# Patient Record
Sex: Female | Born: 1980
Health system: Southern US, Community
[De-identification: ages and names within clinical notes are randomized; demographics above are authoritative.]

## PROBLEM LIST (undated history)

## (undated) DIAGNOSIS — G4710 Hypersomnia, unspecified: Secondary | ICD-10-CM

## (undated) DIAGNOSIS — F329 Major depressive disorder, single episode, unspecified: Secondary | ICD-10-CM

## (undated) DIAGNOSIS — R519 Headache, unspecified: Secondary | ICD-10-CM

## (undated) DIAGNOSIS — G894 Chronic pain syndrome: Secondary | ICD-10-CM

## (undated) DIAGNOSIS — F411 Generalized anxiety disorder: Secondary | ICD-10-CM

## (undated) DIAGNOSIS — R51 Headache: Secondary | ICD-10-CM

## (undated) DIAGNOSIS — G35 Multiple sclerosis: Principal | ICD-10-CM

## (undated) DIAGNOSIS — H539 Unspecified visual disturbance: Secondary | ICD-10-CM

## (undated) DIAGNOSIS — F429 Obsessive-compulsive disorder, unspecified: Secondary | ICD-10-CM

## (undated) DIAGNOSIS — F32A Depression, unspecified: Secondary | ICD-10-CM

## (undated) DIAGNOSIS — M797 Fibromyalgia: Secondary | ICD-10-CM

## (undated) HISTORY — PX: NO PAST SURGERIES: SHX2092

## (undated) HISTORY — DX: Unspecified visual disturbance: H53.9

## (undated) HISTORY — DX: Headache, unspecified: R51.9

## (undated) HISTORY — DX: Hypersomnia, unspecified: G47.10

## (undated) HISTORY — DX: Headache: R51

## (undated) HISTORY — DX: Multiple sclerosis: G35

---

## 1999-02-21 ENCOUNTER — Inpatient Hospital Stay (HOSPITAL_COMMUNITY): Admission: AD | Admit: 1999-02-21 | Discharge: 1999-02-21 | Payer: Self-pay | Admitting: Obstetrics & Gynecology

## 2011-04-21 DIAGNOSIS — Z833 Family history of diabetes mellitus: Secondary | ICD-10-CM | POA: Insufficient documentation

## 2011-04-21 DIAGNOSIS — R634 Abnormal weight loss: Secondary | ICD-10-CM | POA: Insufficient documentation

## 2011-09-20 DIAGNOSIS — G35 Multiple sclerosis: Secondary | ICD-10-CM | POA: Insufficient documentation

## 2011-10-10 DIAGNOSIS — R208 Other disturbances of skin sensation: Secondary | ICD-10-CM | POA: Insufficient documentation

## 2011-12-28 DIAGNOSIS — R202 Paresthesia of skin: Secondary | ICD-10-CM | POA: Insufficient documentation

## 2011-12-28 DIAGNOSIS — R29898 Other symptoms and signs involving the musculoskeletal system: Secondary | ICD-10-CM | POA: Insufficient documentation

## 2011-12-28 DIAGNOSIS — H538 Other visual disturbances: Secondary | ICD-10-CM | POA: Insufficient documentation

## 2011-12-28 DIAGNOSIS — Z720 Tobacco use: Secondary | ICD-10-CM | POA: Insufficient documentation

## 2011-12-28 DIAGNOSIS — F17200 Nicotine dependence, unspecified, uncomplicated: Secondary | ICD-10-CM | POA: Insufficient documentation

## 2011-12-28 DIAGNOSIS — G35 Multiple sclerosis: Secondary | ICD-10-CM | POA: Insufficient documentation

## 2014-10-28 ENCOUNTER — Ambulatory Visit (INDEPENDENT_AMBULATORY_CARE_PROVIDER_SITE_OTHER): Payer: Medicare Other | Admitting: Neurology

## 2014-10-28 ENCOUNTER — Encounter: Payer: Self-pay | Admitting: Neurology

## 2014-10-28 VITALS — BP 110/76 | HR 84 | Resp 12 | Ht 64.0 in | Wt 109.4 lb

## 2014-10-28 DIAGNOSIS — R269 Unspecified abnormalities of gait and mobility: Secondary | ICD-10-CM

## 2014-10-28 DIAGNOSIS — R350 Frequency of micturition: Secondary | ICD-10-CM | POA: Insufficient documentation

## 2014-10-28 DIAGNOSIS — R5383 Other fatigue: Secondary | ICD-10-CM | POA: Diagnosis not present

## 2014-10-28 DIAGNOSIS — M5417 Radiculopathy, lumbosacral region: Secondary | ICD-10-CM | POA: Insufficient documentation

## 2014-10-28 DIAGNOSIS — R208 Other disturbances of skin sensation: Secondary | ICD-10-CM

## 2014-10-28 DIAGNOSIS — F09 Unspecified mental disorder due to known physiological condition: Secondary | ICD-10-CM

## 2014-10-28 DIAGNOSIS — G35 Multiple sclerosis: Secondary | ICD-10-CM

## 2014-10-28 HISTORY — DX: Multiple sclerosis: G35

## 2014-10-28 MED ORDER — LAMOTRIGINE 25 MG PO TABS: ORAL_TABLET | ORAL | Status: DC

## 2014-10-28 MED ORDER — METHOCARBAMOL 500 MG PO TABS: 500.0000 mg | ORAL_TABLET | Freq: Three times a day (TID) | ORAL | Status: DC

## 2014-10-28 MED ORDER — LAMOTRIGINE 100 MG PO TABS: 100.0000 mg | ORAL_TABLET | Freq: Every day | ORAL | Status: DC

## 2014-10-28 NOTE — Progress Notes (Signed)
[facility]  PATIENT: Sabrina [name] DOB: May 22, 1981  REFERRING DOCTOR OR PCP:  Lorelei Pont SOURCE: Patient and records form PCP  _________________________________   HISTORICAL  CHIEF COMPLAINT:  Chief Complaint  Patient presents with  . Multiple Sclerosis    Sts. she was dx. in December 2012.  Presenting sx. was optic neuritis right eye.  Sts. she initially saw neuro in [place], [place].  Sts. she stopped in 2015 due to inj. site fatigue.  She then tried Gilenya but couldn't tolerate side effects--gi mainly--and felt depression worsened. She restarted Copaxone one month ago. She is afraid to try another oral therapy due to potential side effects./fim   . Back Pain    Sts. has had lbp, left hip pain radiating down the back and front of left leg to foot., onset 2014 with no known injury.  She is currently taking Hydrocodone  bid prn.  Sts.  has not had any imaging studies.  Sts. has never had any procedure (esi, tpi, hip inj) for pain.  Sts. oral and iv steroids help pain./fim    HISTORY OF PRESENT ILLNESS:  I had the pleasure of seeing your patient, Sabrina [name], at for a neurologic consultation regarding her MS.    She had right optic neuritis in December 2012 and had an MRI of the brain showing optic nerve inflammation and many white matter spots.    She saw Dr. Macon Large in [place].   She was started on Copaxone.  She likely had an exacerbation in 2013 when she had trouble walking x 2 months.   She received a few days of IV Solu-Medrol.  Last year, she also received a few days of IV Solu-Medrol but she is uncertain   She felt she did well for the first 2 years but switched to Gilenya because she was tired of the shots last year.   At first, she tolerated it well but then her depression got much worse and she had some stomach issues so she returned to Copaxone last month (20 mg daily).    She was told her last MRI's show no new lesions but she has too numerous to count  old lesions.   She also has seen Dr. Laurell Josephs in [facility] and Dr. Clent Ridges in [place]  Gait/strength/sensation:   She loses balance easily and has had left leg buckling multiple times, leading to a fall several times.    She has much less endurance with gait and has difficulty doing shopping due to endurance.  For short distances, she does not use an aid but for longer distances will bring a walker mostly for the availability of the seat.     She denies any fixed numbness but has pain in the left leg.     Leg Pain:   In the fall 2014, she began to experience left hip to foot pain.  Pain increases with walking and standing.    However, she continues to have some pain with sitting and laying down.    Pain is sharp.  When it radiates to her foot, pain goes to the outer foot and the heel more than the top.   She has mild back pain as well and notes muscle tightness and spasms a lot.    Pain is paraspinal.    Flexeril helps the pain (takes as needed) but it makes her very sleepy.   She felt bad on gabapentin and Lyrica and Tegretol.  Vision:   She notes continued mild blurriness but  has not noted color desaturation out of the right eye.  She denies any diplopia.  Bladder:  She notes urinary frequency and urgency now but had some incontinence in 2013.   She has 1 -2 times nocturia.   She has been told she has MS plaques on her spine.  He has never had any medication for this.     Fatigue/sleep:   She feels physical > mental fatigue.    She feels more tired on Copaxone than on Gilenya.    Caffeine helped some.   Ritalin helped but when it wore off, she felt very tired.     She feels she sleeps well and denies any problems falling asleep or staying asleep.   She does not have any RLS/PLMS symptoms  Mood/cognition:   She reports both depression and anxiety, worse since being diagnosed.  She has had panic attacks since her teen years and takes clonazepam.     She has had difficulty with slow processing speed and  coming up with the right word.   She also notes memory difficulty.  REVIEW OF SYSTEMS: Constitutional: No fevers, chills, sweats, or change in appetite Eyes: No visual changes, double vision, eye pain Ear, nose and throat: No hearing loss, ear pain, nasal congestion, sore throat Cardiovascular: No chest pain, palpitations Respiratory: No shortness of breath at rest or with exertion.   No wheezes GastrointestinaI: No nausea, vomiting, diarrhea, abdominal pain, fecal incontinence Genitourinary: No dysuria, urinary retention or frequency.  No nocturia. Musculoskeletal: No neck pain, back pain Integumentary: No rash, pruritus, skin lesions Neurological: as above Psychiatric: No depression at this time.  No anxiety Endocrine: No palpitations, diaphoresis, change in appetite, change in weigh or increased thirst Hematologic/Lymphatic: No anemia, purpura, petechiae. Allergic/Immunologic: No itchy/runny eyes, nasal congestion, recent allergic reactions, rashes  ALLERGIES: Allergies  Allergen Reactions  . Amoxicillin   . Penicillins   . Sulfa Antibiotics   . Codeine Nausea And Vomiting    HOME MEDICATIONS:  Current outpatient prescriptions:  .  b complex vitamins tablet, every day., Disp: , Rfl:  .  Cholecalciferol 5000 UNITS TABS, Take by mouth., Disp: , Rfl:  .  clonazePAM (KLONOPIN) 0.5 MG tablet, 1 tab po every morning, 1 tab po at noon, and 2 tabs po every evening, Disp: , Rfl:  .  COPAXONE 20 MG/ML SOSY injection, , Disp: , Rfl:  .  cyclobenzaprine (FLEXERIL) 10 MG tablet, Take 10 mg by mouth 2 (two) times daily., Disp: , Rfl: 3 .  DHA-EPA-VITAMIN E PO, Take by mouth., Disp: , Rfl:  .  HYDROcodone-acetaminophen (NORCO) 10-325 MG per tablet, Take 1 tablet by mouth 2 (two) times daily as needed., Disp: , Rfl: 0 .  Multiple Vitamin (MULTI VITAMIN MENS) tablet, Take by mouth., Disp: , Rfl:  .  Norgestimate-Ethinyl Estradiol Triphasic 0.18/0.215/0.25 MG-35 MCG tablet, Take by mouth.,  Disp: , Rfl:  .  PRISTIQ 50 MG 24 hr tablet, , Disp: , Rfl:   PAST MEDICAL HISTORY: Past Medical History  Diagnosis Date  . Multiple sclerosis 10/28/2014  . Headache   . Vision abnormalities     PAST SURGICAL HISTORY: History reviewed. No pertinent past surgical history.  FAMILY HISTORY: Family History  Problem Relation Age of Onset  . Healthy Mother   . Diabetes type II Father   . Prostate cancer Father     SOCIAL HISTORY:  History   Social History  . Marital Status: Single    Spouse Name: N/A  . Number  of Children: N/A  . Years of Education: N/A   Occupational History  . Not on file.   Social History Main Topics  . Smoking status: Current Every Day Smoker -- 0.50 packs/day    Types: Cigarettes  . Smokeless tobacco: Not on file  . Alcohol Use: No  . Drug Use: No  . Sexual Activity: Not on file   Other Topics Concern  . Not on file   Social History Narrative  . No narrative on file     PHYSICAL EXAM  Filed Vitals:   10/28/14 1312  BP: 110/76  Pulse: 84  Resp: 12  Height: 5\' 4"  (1.626 m)  Weight: 109 lb 6.4 oz (49.624 kg)    Body mass index is 18.77 kg/(m^2).   General: The patient is well-developed and well-nourished and in no acute distress  Eyes:  Funduscopic exam shows normal optic discs and retinal vessels.  Neck: The neck is supple, no carotid bruits are noted.  The neck is nontender.  Cardiovascular: The heart has a regular rate and rhythm with a normal S1 and S2. There were no murmurs, gallops or rubs. Lungs are clear to auscultation.  Skin: Extremities are without significant edema.  Musculoskeletal:  Back is mildly tender over piriformis muscles, trochanteric bursae and lower paraspinal muscles.  Neurologic Exam  Mental status: The patient is alert and oriented x 3 at the time of the examination. The patient has apparent normal recent and remote memory, with an apparently reduced attention span and concentration ability.   She has  some word finding difficulties on several occasions during our interview..  Cranial nerves: Extraocular movements are full. Pupils are equal, round, and reactive to light and accomodation.  Visual fields are full.  Facial symmetry is present. There is good facial sensation to soft touch bilaterally.Facial strength is normal.  Trapezius and sternocleidomastoid strength is normal. No dysarthria is noted.  The tongue is midline, and the patient has symmetric elevation of the soft palate. No obvious hearing deficits are noted.  Motor:  Muscle bulk is normal.   Tone is mildly increased in legs. Strength is  5 / 5 in all 4 extremities.   Sensory: Sensory testing is intact to pinprick, soft touch and vibration sensation in her arms. She has allodynia in the S1 worse than L5 distributions of the left foot..  Coordination: Cerebellar testing reveals good finger-nose-finger and heel-to-shin bilaterally.  Gait and station: Station is normal.   Gait is wide. Tandem gait is poor (needs support). Romberg is mildly positive.   Reflexes: Deep tendon reflexes are symmetric and increased bilaterally with 2 beats clonue at the ankles.   Plantar responses are flexor.    DIAGNOSTIC DATA (LABS, IMAGING, TESTING) - I reviewed patient records, labs, notes, testing and imaging myself where available.       ASSESSMENT AND PLAN  Multiple sclerosis - Plan: MR Brain W Wo Contrast, Stratify JCV Antibody Test ([facility]), CBC with Differential  Other fatigue - Plan: MR Brain W Wo Contrast  Gait disorder - Plan: MR Brain W Wo Contrast  Lumbosacral radiculopathy at S1 - Plan: MR Lumbar Spine Wo Contrast  Cognitive dysfunction  Urinary frequency  Dysesthesia   In summary, Sabrina [name] is a 34 year old woman who was diagnosed with MS in 2012 who has been treated with Copaxone and Gilenya in the past. I discussed with her that I have some concern that the Copaxone may not be working well enough for her. Clearly,  her first exacerbation  was on Copaxone. She is uncertain if the second one occurred on Copaxone or shortly after starting Gilenya. I am going to try to get her MRI films to determine if there is  progression on the images. We discussed various options.   As she has had some spinal plaques, her MS is a little more aggressive than average and I would consider using Tysabri if she is JCV negative or low positive and Lemtrada if she is JCV antibody high positive.  Tecfidera might be another option for her.  We will obtain another MRI of the brain with and without contrast to better assess for subclinical progression and go over her options in more detail after those results.  Her leg pain is more likely to be due to a left S1 radiculopathy then to her MS. We will obtain an MRI of the lumbar spine and recommended epidural steroid injection or other procedures based on the results, if abnormal. I will also have her start lamotrigine as she was unable to tolerate other medications at often help neuropathic pain. I'll add methocarbamol as well for her back spasms.  She will return to see me in 3 months or sooner if she has new or worsening neurologic symptoms or based on the results of her studies and comparison with prior studies.   Dalayza Zambrana A. Epimenio Foot, MD, PhD 10/28/2014, 1:26 PM Certified in Neurology, Clinical Neurophysiology, Sleep Medicine, Pain Medicine and Neuroimaging  [facility] [address], [address] [place], [place] [address] [phone]

## 2014-10-29 LAB — CBC WITH DIFFERENTIAL/PLATELET
Basophils Absolute: 0 10*3/uL (ref 0.0–0.2)
Basos: 0 %
Eos: 1 %
Eosinophils Absolute: 0.1 10*3/uL (ref 0.0–0.4)
HEMATOCRIT: 43.6 % (ref 34.0–46.6)
HEMOGLOBIN: 14.4 g/dL (ref 11.1–15.9)
IMMATURE GRANS (ABS): 0 10*3/uL (ref 0.0–0.1)
IMMATURE GRANULOCYTES: 0 %
Lymphocytes Absolute: 2.1 10*3/uL (ref 0.7–3.1)
Lymphs: 27 %
MCH: 31.2 pg (ref 26.6–33.0)
MCHC: 33 g/dL (ref 31.5–35.7)
MCV: 94 fL (ref 79–97)
Monocytes Absolute: 0.5 10*3/uL (ref 0.1–0.9)
Monocytes: 7 %
Neutrophils Absolute: 5.1 10*3/uL (ref 1.4–7.0)
Neutrophils Relative %: 65 %
Platelets: 312 10*3/uL (ref 150–379)
RBC: 4.62 x10E6/uL (ref 3.77–5.28)
RDW: 12.6 % (ref 12.3–15.4)
WBC: 7.8 10*3/uL (ref 3.4–10.8)

## 2014-11-10 ENCOUNTER — Ambulatory Visit
Admission: RE | Admit: 2014-11-10 | Discharge: 2014-11-10 | Disposition: A | Discharge status: Home / Self Care | Payer: Medicare Other | Source: Ambulatory Visit | Attending: Neurology | Admitting: Neurology

## 2014-11-10 DIAGNOSIS — R5383 Other fatigue: Secondary | ICD-10-CM

## 2014-11-10 DIAGNOSIS — R269 Unspecified abnormalities of gait and mobility: Secondary | ICD-10-CM

## 2014-11-10 DIAGNOSIS — G35 Multiple sclerosis: Secondary | ICD-10-CM | POA: Diagnosis not present

## 2014-11-10 DIAGNOSIS — M5417 Radiculopathy, lumbosacral region: Secondary | ICD-10-CM | POA: Diagnosis not present

## 2014-11-10 MED ORDER — GADOBENATE DIMEGLUMINE 529 MG/ML IV SOLN
10.0000 mL | Freq: Once | INTRAVENOUS | Status: AC | PRN
  Administered 2014-11-10: 10 mL via INTRAVENOUS

## 2014-11-12 ENCOUNTER — Telehealth: Payer: Self-pay | Admitting: *Deleted

## 2014-11-12 NOTE — Telephone Encounter (Signed)
LMTC.  She has not signed a Ty start form, so she will need to come in to do this./fim

## 2014-11-12 NOTE — Telephone Encounter (Signed)
-----   Message from Asa Lente, MD sent at 11/12/2014 11:03 AM EDT ----- MRI lumbar spine was normal MRI brain shows MS   One focus was brand new.     Her JCV Ab test is negative.   We had previously discussed Tysabri -- if interested, we can start this (does she have SRF?)

## 2014-11-13 ENCOUNTER — Telehealth: Payer: Self-pay | Admitting: *Deleted

## 2014-11-13 NOTE — Telephone Encounter (Signed)
-----   Message from Richard A Sater, MD sent at 11/12/2014 11:03 AM EDT ----- MRI lumbar spine was normal MRI brain shows MS   One focus was brand new.     Her JCV Ab test is negative.   We had previously discussed Tysabri -- if interested, we can start this (does she have SRF?) 

## 2014-11-13 NOTE — Telephone Encounter (Signed)
I have spoken with Sabrina [name] this morning, and per Sabrina [name], advised that mri back is normal, but she does have one new lesion on mri brain.  I have advised that if she is agreeable,  he would like her to start Tysabri.  Sabrina [name] has some confusion over the difference b/t an exacerbation of sx. and actual progression of ms as evidenced by new lesions.  She felt she would just need a course of iv steroids. I have explained that new lesions would indicate a stronger ms therapy is needed.  She sts. she remembers having Tysabri discussion with Sabrina [name], but doesn't remember details of the discussion.  I have reviewed with her an overall view of Tysabri, that jcv ab was neg, but this doesn;t seem to help her recall discussion she had with Sabrina [name].  I have given her an appt. for 0840 tomorrow am to disucss further, and if she is agreeable to starting Tysabri after her talk with Sabrina [name], she can sign start form at that time/fim

## 2014-11-13 NOTE — Telephone Encounter (Signed)
Patient called/returning Faith's call from 11/12/14. Patient can be reached @ [phone]

## 2014-11-13 NOTE — Telephone Encounter (Signed)
I have spoken with Sabrina [name] today and per RAS, advsied that the mri of her back is normal, but mri brain shows one new appearing lesion.  We had a lengthy pleasant conversation about Tysabri, but she still has questions, so I have given  her an appt. for tomorrow am at 0840, arrival time of 0830/fim

## 2014-11-14 ENCOUNTER — Ambulatory Visit (INDEPENDENT_AMBULATORY_CARE_PROVIDER_SITE_OTHER): Payer: Medicare Other | Admitting: Neurology

## 2014-11-14 ENCOUNTER — Encounter: Payer: Self-pay | Admitting: Neurology

## 2014-11-14 ENCOUNTER — Encounter: Payer: Self-pay | Admitting: *Deleted

## 2014-11-14 VITALS — BP 149/94 | HR 78 | Resp 14 | Ht 64.0 in | Wt 111.8 lb

## 2014-11-14 DIAGNOSIS — R269 Unspecified abnormalities of gait and mobility: Secondary | ICD-10-CM

## 2014-11-14 DIAGNOSIS — R5383 Other fatigue: Secondary | ICD-10-CM | POA: Diagnosis not present

## 2014-11-14 DIAGNOSIS — R350 Frequency of micturition: Secondary | ICD-10-CM | POA: Diagnosis not present

## 2014-11-14 DIAGNOSIS — R208 Other disturbances of skin sensation: Secondary | ICD-10-CM | POA: Diagnosis not present

## 2014-11-14 DIAGNOSIS — G35 Multiple sclerosis: Secondary | ICD-10-CM | POA: Diagnosis not present

## 2014-11-14 DIAGNOSIS — F09 Unspecified mental disorder due to known physiological condition: Secondary | ICD-10-CM | POA: Diagnosis not present

## 2014-11-14 NOTE — Progress Notes (Signed)
u  [facility]  PATIENT: Sabrina [name] DOB: September 03, 1980  REFERRING DOCTOR OR PCP:  Lorelei Pont SOURCE: Patient and records form PCP  _________________________________   HISTORICAL  CHIEF COMPLAINT:  Chief Complaint  Patient presents with  . Multiple Sclerosis    Recent mri showed new ms lesion.  She is here today to discuss a change in therapy/fim    HISTORY OF PRESENT ILLNESS:  Sabrina [name] is here with her mother for a follow up visit.       MS has been mostly stable since the last visit.  No new numbness or clumsiness but Vision seems worse on the right side.   The last 10 days, she feels more leg weakness, left worse than right (usually right strength does not bother her)  Gait/strength/sensation:   Her gait is wide. She stumbles but has not fallen recently. The left leg is worse than the right. She feels generally weak, especially in the left leg. Her endurance is poor. She will need to use a walker  for long distances.     Pain continues to be a problem for her.   Pain increases with walking and standing.    However, she continues to have some pain with sitting and laying down.    Pain is sharp.  When it radiates to her foot, pain goes to the outer foot and the heel more than the top.   Pain goes down both legs but is worse on the left.  She felt bad on gabapentin and Lyrica and Tegretol.  She just started Lamictal.  Vision:      She notes continued mild blurriness but has not noted color desaturation out of the right eye.  She denies any diplopia.  Bladder:  She notes urinary frequency and urgency now but had some incontinence in 2013.   She has 1 -2 times nocturia.   She has been told she has MS plaques on her spine.   MS History:  She had right optic neuritis in December 2012 and had an MRI of the brain showing optic nerve inflammation and many white matter spots.    She saw Dr. Macon Large in [place].   She was started on Copaxone.  She likely had an  exacerbation in 2013 when she had trouble walking x 2 months.   She received a few days of IV Solu-Medrol.  Last year, she also received a few days of IV Solu-Medrol but she is uncertain   She felt she did well for the first 2 years but switched to Gilenya because she was tired of the shots last year.   At first, she tolerated it well but then her depression got much worse and she had some stomach issues so she returned to Copaxone last month (20 mg daily).   MRI a couple weeks ago showed multiple old MS plaques and 1 Enhancing focus in the right frontal lobe.       Fatigue/sleep:   She feels physical > mental fatigue.    She feels more tired on Copaxone than on Gilenya.    Caffeine helped some.   Ritalin helped but when it wore off, she felt very tired.     She feels she sleeps well and denies any problems falling asleep or staying asleep.   She does not have any RLS/PLMS symptoms  Mood/cognition:   She reports both depression and anxiety, worse since being diagnosed.  She has had panic attacks since her teen years and takes  clonazepam.     She has had difficulty with slow processing speed and coming up with the right word.   She also notes memory difficulty.  REVIEW OF SYSTEMS: Constitutional: No fevers, chills, sweats, or change in appetite.  Notes a lot of fatigue Eyes: No visual changes, double vision, eye pain Ear, nose and throat: No hearing loss, ear pain, nasal congestion, sore throat Cardiovascular: No chest pain, palpitations Respiratory: No shortness of breath at rest or with exertion.   No wheezes GastrointestinaI: No nausea, vomiting, diarrhea, abdominal pain, fecal incontinence Genitourinary: as above. Musculoskeletal: No neck pain, back pain Integumentary: No rash, pruritus, skin lesions Neurological: as above Psychiatric: Notes some depression and anxiety Endocrine: No palpitations, diaphoresis, change in appetite, change in weigh or increased thirst Hematologic/Lymphatic: No  anemia, purpura, petechiae. Allergic/Immunologic: No itchy/runny eyes, nasal congestion, recent allergic reactions, rashes  ALLERGIES: Allergies  Allergen Reactions  . Amoxicillin   . Penicillins   . Sulfa Antibiotics   . Codeine Nausea And Vomiting    HOME MEDICATIONS:  Current outpatient prescriptions:  .  b complex vitamins tablet, every day., Disp: , Rfl:  .  Cholecalciferol 5000 UNITS TABS, Take by mouth., Disp: , Rfl:  .  clonazePAM (KLONOPIN) 0.5 MG tablet, 1 tab po every morning, 1 tab po at noon, and 2 tabs po every evening, Disp: , Rfl:  .  COPAXONE 20 MG/ML SOSY injection, , Disp: , Rfl:  .  cyclobenzaprine (FLEXERIL) 10 MG tablet, Take 10 mg by mouth 2 (two) times daily., Disp: , Rfl: 3 .  DHA-EPA-VITAMIN E PO, Take by mouth., Disp: , Rfl:  .  HYDROcodone-acetaminophen (NORCO) 10-325 MG per tablet, Take 1 tablet by mouth 2 (two) times daily as needed., Disp: , Rfl: 0 .  lamoTRIgine (LAMICTAL) 100 MG tablet, Take 1 tablet (100 mg total) by mouth daily., Disp: 60 tablet, Rfl: 11 .  lamoTRIgine (LAMICTAL) 25 MG tablet, Take one pill daily x 1 week, then one pill twice daily x 1 week, then one pill three times daily x 1 week, then 2 pills twice daily x 1 week, then start other prescription, Disp: 70 tablet, Rfl: 0 .  methocarbamol (ROBAXIN) 500 MG tablet, Take 1 tablet (500 mg total) by mouth 3 (three) times daily., Disp: 90 tablet, Rfl: 5 .  Multiple Vitamin (MULTI VITAMIN MENS) tablet, Take by mouth., Disp: , Rfl:  .  Norgestimate-Ethinyl Estradiol Triphasic 0.18/0.215/0.25 MG-35 MCG tablet, Take by mouth., Disp: , Rfl:  .  PRISTIQ 50 MG 24 hr tablet, , Disp: , Rfl:   PAST MEDICAL HISTORY: Past Medical History  Diagnosis Date  . Multiple sclerosis 10/28/2014  . Headache   . Vision abnormalities     PAST SURGICAL HISTORY: History reviewed. No pertinent past surgical history.  FAMILY HISTORY: Family History  Problem Relation Age of Onset  . Healthy Mother   .  Diabetes type II Father   . Prostate cancer Father     SOCIAL HISTORY:  History   Social History  . Marital Status: Single    Spouse Name: N/A  . Number of Children: N/A  . Years of Education: N/A   Occupational History  . Not on file.   Social History Main Topics  . Smoking status: Current Every Day Smoker -- 0.50 packs/day    Types: Cigarettes  . Smokeless tobacco: Not on file  . Alcohol Use: No  . Drug Use: No  . Sexual Activity: Not on file   Other Topics Concern  .  Not on file   Social History Narrative     PHYSICAL EXAM  Filed Vitals:   11/14/14 0853  BP: 149/94  Pulse: 78  Resp: 14  Height:  (1.626 m)  Weight: 111 lb 12.8 oz (50.712 kg)    Body mass index is 19.18 kg/(m^2).   General: The patient is well-developed and well-nourished and in no acute distress   Neurologic Exam  Mental status: The patient is alert and oriented x 3 at the time of the examination. The patient has apparent normal recent and remote memory, with a mildly reduced attention span and concentration ability.   She has some word finding difficulties on several occasions during our interview..  Cranial nerves: Extraocular movements are full.  Facial symmetry is present. There is good facial sensation to soft touch bilaterally.Facial strength is normal.  Trapezius and sternocleidomastoid strength is normal. No dysarthria is noted.  The tongue is midline, and the patient has symmetric elevation of the soft palate. No obvious hearing deficits are noted.  Motor:  Muscle bulk is normal.   Tone is mildly increased in legs. Strength is  5 / 5 in all 4 extremities.   Sensory: Sensory testing is intact to  touch and vibration sensation in her arms. She has allodynia in the lateral left foot..  Coordination: Cerebellar testing reveals good finger-nose-finger and heel-to-shin bilaterally.  Gait and station: Station is normal.   Gait is wide. Tandem gait is poor (needs support). Romberg  is mildly positive.   Reflexes: Deep tendon reflexes are symmetric and increased bilaterally with 2 beats clonue at the ankles.     DIAGNOSTIC DATA (LABS, IMAGING, TESTING) - I reviewed patient records, labs, notes, testing and imaging myself where available.       ASSESSMENT AND PLAN  Multiple sclerosis  Cognitive dysfunction  Urinary frequency  Other fatigue  Gait disorder  Dysesthesia   1.   We had a long discussion about treatment options and she wishes to go on Tysabri. She signed the service request form and we will send that in today. She understands the risk of PML. Fortunately, her JCV antibody is negative and she is comfortable with her risk. 2.  As her last MRI did show an enhancing lesion and her fatigue has increased, we will do   IV Solu-Medrol 1 g today 3.   She will continue her other medications for now 4.   Once we get her preauthorized, we will schedule her for her Tysabri.  Richard A. Epimenio Foot, MD, PhD 11/14/2014, 8:57 AM Certified in Neurology, Clinical Neurophysiology, Sleep Medicine, Pain Medicine and Neuroimaging  [facility] [address] [place], [place] [address] [phone]

## 2014-11-17 ENCOUNTER — Telehealth: Payer: Self-pay | Admitting: *Deleted

## 2014-11-17 NOTE — Telephone Encounter (Signed)
I received a message from [place] in the infusion suite that Sabrina [name] had called requesting another day of IV SoluMedrol.  I spoke with Sabrina [name]--she reports still feeling fatigued, with scattered thoughts, leg weakness, pain from bilat.  hips to feet.  Per RAS, ok for one more day of SM 1gm IV.  Sabrina [name] is able to infuse her at 1pm.  Sabrina [name] is agreeable with this appt.  When she arrives tomorrow, if she needs supplemental pain med, per RAS, ok to offer Tramadol 50mg  tid prn #90/fim

## 2014-11-25 ENCOUNTER — Telehealth: Payer: Self-pay | Admitting: Neurology

## 2014-11-25 NOTE — Telephone Encounter (Signed)
I lmom for Sabrina [name] to return my call.  I need to review with her the process in which the hospital would like me to use in ordering Tysabri (ex--do they just want a rx with r/f on it? or do they have an order sheet I need to complete? I will also need the fax # to which orders need to be sent/fim

## 2014-11-25 NOTE — Telephone Encounter (Signed)
Rashard with [employer] is calling in regard to tysabri 300 mg/15 ml injection. An order needs to be sent to [facility] @fax  # [phone]. Contact person is Violet [phone]. Thanks!

## 2014-11-25 NOTE — Telephone Encounter (Signed)
Violet called/returning Faith's call. Please call and advise. Violet can be reached @  # [phone] Fax# [phone]

## 2014-11-26 NOTE — Telephone Encounter (Signed)
LMOM for Sabrina [name] to call/fim

## 2014-11-27 ENCOUNTER — Other Ambulatory Visit: Payer: Self-pay | Admitting: *Deleted

## 2014-11-27 MED ORDER — NATALIZUMAB 300 MG/15ML IV CONC: 300.0000 mg | INTRAVENOUS | Status: DC

## 2014-11-27 NOTE — Telephone Encounter (Signed)
Rx. faxed to [name], in elective procedures services at [facility] [place] [facility] [place], fax # [phone].  This is where Jalayla will be receiving her Tysabri infusions/fim

## 2014-12-15 ENCOUNTER — Telehealth: Payer: Self-pay | Admitting: Neurology

## 2014-12-15 NOTE — Telephone Encounter (Signed)
Sabrina [name] with [facility] in [place], [place] called needing diagnosis for natalizumab (TYSABRI) 300 MG/15ML injection . Please call and advise. She can be reached at [phone].

## 2014-12-15 NOTE — Telephone Encounter (Signed)
LMOM for Sabrina [name] to call back.  I faxed her [name]'s chart--dx. for Tysabri is Multiple Sclerosis (icd 9 340, icd 10 is G35.)/fim

## 2014-12-16 NOTE — Telephone Encounter (Signed)
[name] from [facility] called/returning Sabrina [name]'s call. [name] can be reached @ [phone]

## 2014-12-16 NOTE — Telephone Encounter (Signed)
Sabrina [name] with [facility] is calling again as the patient has taken copaxone in the last 30 days. She is getting ready to order tysabri and this drug is on the list that you can't order if she has had in the last 30 days with authorization from the doctor. Call patient [phone].  Thanks!

## 2014-12-16 NOTE — Telephone Encounter (Signed)
I have spoken with [name] and per RAS, advised it is ok to infuse Tysabri today.  Per Shanda Bumps, her last dose of Copaxone was one week ago/fim

## 2014-12-17 ENCOUNTER — Encounter: Payer: Self-pay | Admitting: *Deleted

## 2014-12-26 ENCOUNTER — Other Ambulatory Visit: Payer: Self-pay | Admitting: Neurology

## 2015-01-27 ENCOUNTER — Encounter: Payer: Self-pay | Admitting: Neurology

## 2015-01-27 ENCOUNTER — Ambulatory Visit (INDEPENDENT_AMBULATORY_CARE_PROVIDER_SITE_OTHER): Payer: Medicare Other | Admitting: Neurology

## 2015-01-27 VITALS — BP 122/76 | HR 72 | Resp 14 | Ht 64.0 in | Wt 111.0 lb

## 2015-01-27 DIAGNOSIS — F329 Major depressive disorder, single episode, unspecified: Secondary | ICD-10-CM

## 2015-01-27 DIAGNOSIS — R5383 Other fatigue: Secondary | ICD-10-CM

## 2015-01-27 DIAGNOSIS — F428 Other obsessive-compulsive disorder: Secondary | ICD-10-CM | POA: Insufficient documentation

## 2015-01-27 DIAGNOSIS — R269 Unspecified abnormalities of gait and mobility: Secondary | ICD-10-CM | POA: Diagnosis not present

## 2015-01-27 DIAGNOSIS — R202 Paresthesia of skin: Secondary | ICD-10-CM | POA: Diagnosis not present

## 2015-01-27 DIAGNOSIS — G35 Multiple sclerosis: Secondary | ICD-10-CM | POA: Diagnosis not present

## 2015-01-27 DIAGNOSIS — F411 Generalized anxiety disorder: Secondary | ICD-10-CM | POA: Insufficient documentation

## 2015-01-27 DIAGNOSIS — F09 Unspecified mental disorder due to known physiological condition: Secondary | ICD-10-CM

## 2015-01-27 DIAGNOSIS — F32A Depression, unspecified: Secondary | ICD-10-CM

## 2015-01-27 DIAGNOSIS — R350 Frequency of micturition: Secondary | ICD-10-CM

## 2015-01-27 MED ORDER — LAMOTRIGINE 25 MG PO TABS: ORAL_TABLET | ORAL | Status: DC

## 2015-01-27 MED ORDER — LAMOTRIGINE 100 MG PO TABS: 100.0000 mg | ORAL_TABLET | Freq: Every day | ORAL | Status: DC

## 2015-01-27 MED ORDER — AMPHETAMINE-DEXTROAMPHETAMINE 10 MG PO TABS: 10.0000 mg | ORAL_TABLET | Freq: Two times a day (BID) | ORAL | Status: DC

## 2015-01-27 NOTE — Progress Notes (Signed)
u  [facility]  PATIENT: Sabrina [name] DOB: Mar 13, 1981  REFERRING DOCTOR OR PCP:  Lorelei Pont SOURCE: Patient and records form PCP  _________________________________   HISTORICAL  CHIEF COMPLAINT:  Chief Complaint  Patient presents with  . Multiple Sclerosis    She has Tysabri infusions at [facility] in [place].  Last infusion was 01-13-15.  She sts. she feels more fatigued for a couple of days following infusion.  Sts. pain in bilat hips radiating down both legs to feet is worse.  JCV ab last checked 10-29-14 and was negative at 0.16/fim  . Depression    Mother sts. Sabrina [name] seems more depressed.  Sts. she sits all day, is not active./fim    HISTORY OF PRESENT ILLNESS:  Sabrina [name] is here with her mother for a follow up visit.       MS has been mostly stable since the last visit but she notes more pain.   She has had Tysabri infusions twice in [place].     Gait/strength/sensation/pain:   She repots gait is about the same. No recent falls. The left leg is worse than the right with mild weakness and some spasticity.  She states her endurance is poor. She will need to use a walker  for long distances.     Pain continues to be a problem for her, mostly in the proximal back of the legs.   Pain increases with walking and standing.    She notes more pain recently also while sitting.    Pain is sharp and goes down both legs but is worse on the left.  She felt bad on gabapentin and Lyrica and Tegretol.  She never tried Lamictal.    She also notes mild weakness and stiffness in the left arm.     Vision:      She notes no change in the right blurry vision.    She denies any diplopia.  Bladder:  She notes urinary frequency and urgency now but had some incontinence in 2013.   She has 1 -2 times nocturia.     Fatigue/sleep:   She feels physical > mental fatigue.  Ritalin helped but when it wore off, she felt very tired.     She feels she sleeps well and denies any  problems falling asleep or staying asleep.   She is now noting more RLS/PLMS symptoms and jerks sometimes awaken her at night.    Mood/cognition:   She has had more depression and gets frustrated easily.   She has had panic attacks since her teen years and takes clonazepam.     She has had difficulty with slow processing speed and coming up with the right word.   She also notes memory difficulty.  MS History:  She had right optic neuritis in December 2012 and had an MRI of the brain showing optic nerve inflammation and many white matter spots.    She saw Dr. Macon Large in [place].   She was started on Copaxone.  She likely had an exacerbation in 2013 when she had trouble walking x 2 months.   She received a few days of IV Solu-Medrol.  Last year, she also received a few days of IV Solu-Medrol but she is uncertain   She felt she did well for the first 2 years but switched to Gilenya because she was tired of the shots last year.   At first, she tolerated it well but then her depression got much worse and she had some  stomach issues so she returned to Copaxone last month (20 mg daily).   MRI early 2016 showed multiple old MS plaques and 1  Enhancing focus in the right frontal lobe.       REVIEW OF SYSTEMS: Constitutional: No fevers, chills, sweats, or change in appetite.  Notes a lot of fatigue Eyes: No visual changes, double vision, eye pain Ear, nose and throat: No hearing loss, ear pain, nasal congestion, sore throat Cardiovascular: No chest pain, palpitations Respiratory: No shortness of breath at rest or with exertion.   No wheezes GastrointestinaI: No nausea, vomiting, diarrhea, abdominal pain, fecal incontinence Genitourinary: as above. Musculoskeletal: No neck pain, back pain Integumentary: No rash, pruritus, skin lesions Neurological: as above Psychiatric: Notes some depression and anxiety Endocrine: No palpitations, diaphoresis, change in appetite, change in weigh or increased  thirst Hematologic/Lymphatic: No anemia, purpura, petechiae. Allergic/Immunologic: No itchy/runny eyes, nasal congestion, recent allergic reactions, rashes  ALLERGIES: Allergies  Allergen Reactions  . Amoxicillin   . Penicillins   . Sulfa Antibiotics   . Codeine Nausea And Vomiting    HOME MEDICATIONS:  Current outpatient prescriptions:  .  b complex vitamins tablet, every day., Disp: , Rfl:  .  Cholecalciferol 5000 UNITS TABS, Take by mouth., Disp: , Rfl:  .  clonazePAM (KLONOPIN) 0.5 MG tablet, 1 tab po every morning, 1 tab po at noon, and 2 tabs po every evening, Disp: , Rfl:  .  cyclobenzaprine (FLEXERIL) 10 MG tablet, Take 10 mg by mouth 2 (two) times daily., Disp: , Rfl: 3 .  DHA-EPA-VITAMIN E PO, Take by mouth., Disp: , Rfl:  .  HYDROcodone-acetaminophen (NORCO) 10-325 MG per tablet, Take 1 tablet by mouth 2 (two) times daily as needed., Disp: , Rfl: 0 .  Multiple Vitamin (MULTI VITAMIN MENS) tablet, Take by mouth., Disp: , Rfl:  .  natalizumab (TYSABRI) 300 MG/15ML injection, Inject 15 mLs (300 mg total) into the vein every 30 (thirty) days., Disp: 15 mL, Rfl: 12 .  Norgestimate-Ethinyl Estradiol Triphasic 0.18/0.215/0.25 MG-35 MCG tablet, Take by mouth., Disp: , Rfl:  .  PRISTIQ 50 MG 24 hr tablet, , Disp: , Rfl:  .  lamoTRIgine (LAMICTAL) 100 MG tablet, Take 1 tablet (100 mg total) by mouth daily. (Patient not taking: Reported on 01/27/2015), Disp: 60 tablet, Rfl: 11 .  lamoTRIgine (LAMICTAL) 25 MG tablet, Take one pill daily x 1 week, then one pill twice daily x 1 week, then one pill three times daily x 1 week, then 2 pills twice daily x 1 week, then start other prescription (Patient not taking: Reported on 01/27/2015), Disp: 70 tablet, Rfl: 0 .  methocarbamol (ROBAXIN) 500 MG tablet, Take 1 tablet (500 mg total) by mouth 3 (three) times daily. (Patient not taking: Reported on 01/27/2015), Disp: 90 tablet, Rfl: 5  PAST MEDICAL HISTORY: Past Medical History  Diagnosis Date  .  Multiple sclerosis 10/28/2014  . Headache   . Vision abnormalities     PAST SURGICAL HISTORY: History reviewed. No pertinent past surgical history.  FAMILY HISTORY: Family History  Problem Relation Age of Onset  . Healthy Mother   . Diabetes type II Father   . Prostate cancer Father     SOCIAL HISTORY:  History   Social History  . Marital Status: Single    Spouse Name: N/A  . Number of Children: N/A  . Years of Education: N/A   Occupational History  . Not on file.   Social History Main Topics  . Smoking status:  Current Every Day Smoker -- 0.50 packs/day    Types: Cigarettes  . Smokeless tobacco: Not on file  . Alcohol Use: No  . Drug Use: No  . Sexual Activity: Not on file   Other Topics Concern  . Not on file   Social History Narrative     PHYSICAL EXAM  Filed Vitals:   01/27/15 1417  BP: 122/76  Pulse: 72  Resp: 14  Height: 5\' 4"  (1.626 m)  Weight: 111 lb (50.349 kg)    Body mass index is 19.04 kg/(m^2).   General: The patient is well-developed and well-nourished and in no acute distress   Neurologic Exam  Mental status: The patient is alert and oriented x 3 at the time of the examination. The patient has apparent normal recent and remote memory, with a mildly reduced attention span and concentration ability.   She has some word finding difficulties on several occasions during our interview..  Cranial nerves: Extraocular movements are full.  Facial symmetry is present. There is good facial sensation to soft touch bilaterally.Facial strength is normal.  Trapezius and sternocleidomastoid strength is normal. No dysarthria is noted.  The tongue is midline, and the patient has symmetric elevation of the soft palate. No obvious hearing deficits are noted.  Motor:  Muscle bulk is normal.   Tone is mildly increased in legs. Strength is  5 / 5 in all 4 extremities.   Sensory: Sensory testing is intact to  touch and vibration sensation in her arms. She has  allodynia in the lateral left foot..  Coordination: Cerebellar testing reveals good finger-nose-finger and heel-to-shin bilaterally.  Gait and station: Station is normal.   Gait is wide. Tandem gait is poor (needs support). Romberg is mildly positive.   Reflexes: Deep tendon reflexes are symmetric and increased bilaterally with 2 beats clonue at the ankles.     DIAGNOSTIC DATA (LABS, IMAGING, TESTING) - I reviewed patient records, labs, notes, testing and imaging myself where available.       ASSESSMENT AND PLAN  Multiple sclerosis  Other fatigue  Leg paresthesia  Gait disorder  Clinical depression  Cognitive dysfunction  Urinary frequency    1.   Continue Tysabri 2.   Add lamotrigine for dysesthesia and add Adderall for fatigue/attentional deficit. 3.   If mood does not improve, refer to behavioral health 4.   rtc 4 months, sooner if problems   Kaybree Williams A. Epimenio Foot, MD, PhD 01/27/2015, 2:25 PM Certified in Neurology, Clinical Neurophysiology, Sleep Medicine, Pain Medicine and Neuroimaging  [facility] [address], [address] [place], [place] [address] [phone]

## 2015-01-27 NOTE — Patient Instructions (Signed)
For one week take lamotrigine 25 mg once a day Then for one week take lamotrigine 25 mg twice a day Then for 1 week take lamotrigine 25 mg 3 times a day Then for one week take lamotrigine 50 mg (2 pills) twice a day Then started taking the larger 100 mg tablets twice a day

## 2015-02-05 ENCOUNTER — Telehealth: Payer: Self-pay | Admitting: Neurology

## 2015-02-05 NOTE — Telephone Encounter (Signed)
Husband called wife is in a lot more pain than usual, especially her legs. She has MS. Is there anything that the Doctor could call in?

## 2015-02-05 NOTE — Telephone Encounter (Signed)
I have spoken with Leonette Most this afternoon. Lengthy, pleasant conversation.   He sts. Rosealie continues to c/o bilat leg pain, and  is spending much of her time in bed.  Jeani is still titrating up on her lamictal--she is currently at 25mg  bid, is tolerating it well.  I have advised she continue titrating as rx'd, up to 100mg  daily.  Also, as discussed with RAS at her 01-27-15 ov, she needs to be more active.  I have advised that prolonged bedrest will cause more stiffness in her joints, and contribute to muscle wasting, which will increase her pain.  I have advised more walking in an air conditioned environment--for example, at home, the grocery store, mall etc.  Shopping would serve as a distraction from her discomfort while providing exercise.  I have also reviewed the rest of Murline's meds with Charles--she has Flexeril that she can take bid, as well as Robaxin, and Hydrocodone as well.  I have encouraged her to use the Flexeril bid if she is having spasms, as this will help more than the Hydrocodone, and to reserve the Hydrocodone for more severe pain.  Leonette Most expresses frustration with Breionna's continued pain and his lack of control, and has asked if RAS would give rx. for stronger opiates--"a step above Hydrocodone".  I have again reinforced that she should continue titrating up with Lamictal, take Flexeril as directed and prn Hydrocodone, but that as discussed at her ov, mood/depression/inactivity also likely contribute to increased pain level, and that the hope is that once she is on the desired dose of Lamictal--100mg  daily her mood will improve as well as her pain.  Again have encouraged more activity.  He verbalized understanding of same, sts. this is a "fair" plan, and will call back prn./fim

## 2015-02-18 ENCOUNTER — Telehealth: Payer: Self-pay | Admitting: Neurology

## 2015-02-18 DIAGNOSIS — G894 Chronic pain syndrome: Secondary | ICD-10-CM

## 2015-02-18 DIAGNOSIS — M79605 Pain in left leg: Secondary | ICD-10-CM

## 2015-02-18 DIAGNOSIS — M79604 Pain in right leg: Secondary | ICD-10-CM

## 2015-02-18 NOTE — Telephone Encounter (Signed)
I have spoken with Sabrina [name], Sabrina [name]'s mother.  She sts. she is concerned about the amt. of pain Sabrina [name] has, and would like a referral to a pain mx. clinic in [place].  Per RAS, this is ok--they live in [place] and I have offered referral to a clinic there, but she sts. they do not have a pain mx. clinic there--she requests [place].  I have advised I will research this and make referral.  It may take a few business days, as RAS is out of the office tomorrow and Friday and I will be working with another physician, but I will get this done no later than the first of the week.  I will then call Sabrina [name] back to let her know where referral was sent.  She is agreeable with this plan/fim

## 2015-02-18 NOTE — Telephone Encounter (Signed)
Patient's mother is calling because she is very concerned about the patient. Can the patient be referred to a pain clinic for a consult? Please call and advise. Thank you.

## 2015-02-23 NOTE — Telephone Encounter (Signed)
I have spoken with Aggie Cosier and advised that referral has been made to [facility].  She is agreeable.  I have given address and phone # for [facility]/fim

## 2015-02-23 NOTE — Telephone Encounter (Signed)
Referral, ov notes, mri report faxed to [facility].  fax # [phone]/fim

## 2015-03-20 NOTE — Telephone Encounter (Signed)
Patient called requesting referral be faxed again to [facility] as they are telling her they never received our referral.

## 2015-03-20 NOTE — Telephone Encounter (Signed)
I have spoken with Lindzey and advised that I did fax referral to [facility] on 02-23-15, and received a fax confirmation, but I have re-faxed it again today per her request/fim

## 2015-05-25 ENCOUNTER — Encounter: Payer: Self-pay | Admitting: *Deleted

## 2015-06-01 ENCOUNTER — Ambulatory Visit: Payer: Medicare Other | Admitting: Neurology

## 2015-06-10 ENCOUNTER — Ambulatory Visit: Payer: Medicare Other | Admitting: Neurology

## 2015-06-11 ENCOUNTER — Encounter: Payer: Self-pay | Admitting: Neurology

## 2015-06-11 ENCOUNTER — Ambulatory Visit (INDEPENDENT_AMBULATORY_CARE_PROVIDER_SITE_OTHER): Payer: Medicare Other | Admitting: Neurology

## 2015-06-11 VITALS — BP 126/84 | HR 68 | Resp 12 | Ht 64.0 in | Wt 109.0 lb

## 2015-06-11 DIAGNOSIS — R350 Frequency of micturition: Secondary | ICD-10-CM

## 2015-06-11 DIAGNOSIS — G35 Multiple sclerosis: Secondary | ICD-10-CM | POA: Diagnosis not present

## 2015-06-11 DIAGNOSIS — F329 Major depressive disorder, single episode, unspecified: Secondary | ICD-10-CM

## 2015-06-11 DIAGNOSIS — R269 Unspecified abnormalities of gait and mobility: Secondary | ICD-10-CM

## 2015-06-11 DIAGNOSIS — R5383 Other fatigue: Secondary | ICD-10-CM

## 2015-06-11 DIAGNOSIS — F429 Obsessive-compulsive disorder, unspecified: Secondary | ICD-10-CM | POA: Insufficient documentation

## 2015-06-11 DIAGNOSIS — M5432 Sciatica, left side: Secondary | ICD-10-CM | POA: Insufficient documentation

## 2015-06-11 DIAGNOSIS — R208 Other disturbances of skin sensation: Secondary | ICD-10-CM | POA: Diagnosis not present

## 2015-06-11 DIAGNOSIS — F32A Depression, unspecified: Secondary | ICD-10-CM

## 2015-06-11 MED ORDER — AMPHETAMINE-DEXTROAMPHETAMINE 10 MG PO TABS: 10.0000 mg | ORAL_TABLET | Freq: Two times a day (BID) | ORAL | Status: DC

## 2015-06-11 NOTE — Progress Notes (Signed)
u  [facility]  PATIENT: Sabrina [name] DOB: 1980/09/02    _________________________________   HISTORICAL  CHIEF COMPLAINT:  Chief Complaint  Patient presents with  . Multiple Sclerosis    Sts. she continues to tolerate Tysabri--usually feels a little down the first couple of days after each infusion, then feels better.  Sts. she continues to have lbp radiating down post. aspect of both legs to feet.  Left worse than right.  Sts. not much relief with Oxycontin rx'd by her pcp./fim    HISTORY OF PRESENT ILLNESS:  Sabrina [name] is a 34 year old woman with multiple sclerosis. She is accompanied by her father.       She reports that her MS has been mostly stable since the last visit but pain is worse.   She has had Tysabri infusions x 6-7 infusions in [place].     Gait/strength/sensation/pain:   She reports gait is about the same. No recent falls. The left leg is worse than the right with mild weakness and some spasticity.  She states her endurance is poor. She no longer uses a wheelchair for longer distances.   Pain:    Pain continues to be a problem for her, mostly in the back and down the legs, left worse than right.   Pain increases with sitting a long time (or even a short time on left buttock) or standing a while.  She felt bad on gabapentin and Lyrica and Tegretol.  She never tried Lamictal.    She also notes mild weakness and stiffness in the left arm.     Vision:      She notes no change in the right blurry vision.    She denies any diplopia.  Bladder:  She notes urinary frequency and urgency now but had some incontinence in 2013.   She has 1 -2 times nocturia.     Fatigue/sleep:   She feels physical > mental fatigue.  Adderall is helping longer than the ritalin did.    She tolerates it well.   No anorexia.    She feels she sleeps well and denies any problems falling asleep or staying asleep.   She is noting less RLS/PLMS symptoms.    Mood/cognition:   She  has less depression and feels better in general, despite her pain.   She had panic attacks and takes clonazepam with benefit.     She has rare difficulty with coming up with the right word.   Memory is better.     She is on Pristiq and lamotrigine  MS History:  She had right optic neuritis in December 2012 and had an MRI of the brain showing optic nerve inflammation and many white matter spots.    She saw Dr. Macon Large in [place].   She was started on Copaxone.  She likely had an exacerbation in 2013 when she had trouble walking x 2 months.   She received a few days of IV Solu-Medrol.  Last year, she also received a few days of IV Solu-Medrol but she is uncertain   She felt she did well for the first 2 years but switched to Gilenya because she was tired of the shots last year.   At first, she tolerated it well but then her depression got much worse and she had some stomach issues so she returned to Copaxone last month (20 mg daily).   MRI early 2016 showed multiple old MS plaques and 1  Enhancing focus in the right frontal lobe.  REVIEW OF SYSTEMS: Constitutional: No fevers, chills, sweats, or change in appetite.  Notes a lot of fatigue Eyes: No visual changes, double vision, eye pain Ear, nose and throat: No hearing loss, ear pain, nasal congestion, sore throat Cardiovascular: No chest pain, palpitations Respiratory: No shortness of breath at rest or with exertion.   No wheezes GastrointestinaI: No nausea, vomiting, diarrhea, abdominal pain, fecal incontinence Genitourinary: as above. Musculoskeletal: No neck pain, back pain Integumentary: No rash, pruritus, skin lesions Neurological: as above Psychiatric: Notes some depression and anxiety Endocrine: No palpitations, diaphoresis, change in appetite, change in weigh or increased thirst Hematologic/Lymphatic: No anemia, purpura, petechiae. Allergic/Immunologic: No itchy/runny eyes, nasal congestion, recent allergic reactions,  rashes  ALLERGIES: Allergies  Allergen Reactions  . Amoxicillin   . Penicillins   . Sulfa Antibiotics   . Codeine Nausea And Vomiting    HOME MEDICATIONS:  Current outpatient prescriptions:  .  amphetamine-dextroamphetamine (ADDERALL) 10 MG tablet, Take 1 tablet (10 mg total) by mouth 2 (two) times daily with a meal., Disp: 60 tablet, Rfl: 0 .  b complex vitamins tablet, every day., Disp: , Rfl:  .  Cholecalciferol 5000 UNITS TABS, Take by mouth., Disp: , Rfl:  .  clonazePAM (KLONOPIN) 0.5 MG tablet, 1 tab po every morning, 1 tab po at noon, and 2 tabs po every evening, Disp: , Rfl:  .  cyclobenzaprine (FLEXERIL) 10 MG tablet, Take 10 mg by mouth 2 (two) times daily., Disp: , Rfl: 3 .  HYDROcodone-acetaminophen (NORCO) 10-325 MG per tablet, Take 1 tablet by mouth 2 (two) times daily as needed., Disp: , Rfl: 0 .  lamoTRIgine (LAMICTAL) 100 MG tablet, Take 1 tablet (100 mg total) by mouth daily., Disp: 60 tablet, Rfl: 11 .  methocarbamol (ROBAXIN) 500 MG tablet, Take 1 tablet (500 mg total) by mouth 3 (three) times daily., Disp: 90 tablet, Rfl: 5 .  Multiple Vitamin (MULTI VITAMIN MENS) tablet, Take by mouth., Disp: , Rfl:  .  natalizumab (TYSABRI) 300 MG/15ML injection, Inject 15 mLs (300 mg total) into the vein every 30 (thirty) days., Disp: 15 mL, Rfl: 12 .  Norgestimate-Ethinyl Estradiol Triphasic 0.18/0.215/0.25 MG-35 MCG tablet, Take by mouth., Disp: , Rfl:  .  PRISTIQ 50 MG 24 hr tablet, , Disp: , Rfl:  .  DHA-EPA-VITAMIN E PO, Take by mouth., Disp: , Rfl:  .  OXYCODONE 10 MG 12 hr tablet, TK 1 T PO BID, Disp: , Rfl: 0  PAST MEDICAL HISTORY: Past Medical History  Diagnosis Date  . Multiple sclerosis (HCC) 10/28/2014  . Headache   . Vision abnormalities     PAST SURGICAL HISTORY: History reviewed. No pertinent past surgical history.  FAMILY HISTORY: Family History  Problem Relation Age of Onset  . Healthy Mother   . Diabetes type II Father   . Prostate cancer Father      SOCIAL HISTORY:  Social History   Social History  . Marital Status: Single    Spouse Name: N/A  . Number of Children: N/A  . Years of Education: N/A   Occupational History  . Not on file.   Social History Main Topics  . Smoking status: Current Every Day Smoker -- 0.50 packs/day    Types: Cigarettes  . Smokeless tobacco: Not on file  . Alcohol Use: No  . Drug Use: No  . Sexual Activity: Not on file   Other Topics Concern  . Not on file   Social History Narrative     PHYSICAL EXAM  Filed Vitals:   06/11/15 1633  BP: 126/84  Pulse: 68  Resp: 12  Height: 5\' 4"  (1.626 m)  Weight: 109 lb (49.442 kg)    Body mass index is 18.7 kg/(m^2).   General: The patient is well-developed and well-nourished and in no acute distress  Musculoskeletal:   She is tender over the left piriformis muscle. Paraspinal muscles are nontender.   Neurologic Exam  Mental status: Affect is normal now.  The patient is alert and oriented x 3 at the time of the examination. The patient has apparent normal recent and remote memory, with a mildly reduced attention span and concentration ability.     Cranial nerves: Extraocular movements are full.  Facial symmetry is present. There is good facial sensation to soft touch bilaterally.Facial strength is normal.  Trapezius and sternocleidomastoid strength is normal. No dysarthria is noted.  The tongue is midline, and the patient has symmetric elevation of the soft palate. No obvious hearing deficits are noted.  Motor:  Muscle bulk is normal.   Tone is mildly increased in legs. Strength is  5 / 5 in all 4 extremities.   Sensory: Sensory testing is intact to  touch and vibration sensation in her arms. She has allodynia in the lateral left foot..  Coordination: Cerebellar testing reveals good finger-nose-finger and heel-to-shin bilaterally.  Gait and station: Station is normal.   Gait is minimally wide. Tandem gait is mildly wide (much better)).  Romberg is negative.   Reflexes: Deep tendon reflexes are symmetric and increased bilaterally with 2 beats clonue at the ankles.      DIAGNOSTIC DATA (LABS, IMAGING, TESTING) - I reviewed patient records, labs, notes, testing and imaging myself where available.       ASSESSMENT AND PLAN  Multiple sclerosis (HCC)  Gait disorder  Other fatigue  Dysesthesia  Urinary frequency  Clinical depression  Left sided sciatica     1.   Continue Tysabri 2.   Renew Adderall for fatigue/attentional deficit. 3.   Trigger point inject the left piriformis muscle with 80 mg Depo-Medrol in 4 mL Marcaine. She tolerated the injection well and there were no complications. 4.   rtc 4 months, sooner if problems   Hanley Woerner A. Epimenio Foot, MD, PhD 06/11/2015, 4:39 PM Certified in Neurology, Clinical Neurophysiology, Sleep Medicine, Pain Medicine and Neuroimaging  [facility] [address] [place], [place] [address] [phone]   6

## 2015-07-08 ENCOUNTER — Telehealth: Payer: Self-pay | Admitting: Neurology

## 2015-07-08 NOTE — Telephone Encounter (Signed)
Left message with information below. I advised her that Dr. Epimenio Foot will be back in the office tomorrow and she is welcome to call back or call and make an appt.

## 2015-07-08 NOTE — Telephone Encounter (Signed)
Chart reviewed, she just receive injection by Dr. Sherol Dade in December first 2016  Trigger point inject the left piriformis muscle with 80 mg Depo-Medrol in 4 mL Marcaine. She tolerated the injection well and there were no complications.  Lafonda Mosses, please call back patient, she just receive injection 4 weeks ago, I prefer her to be reevaluated by Dr. Sherol Dade before proceeding with another injection

## 2015-07-08 NOTE — Telephone Encounter (Signed)
Mother called to advise daughter is at [facility] receiving infusion therapy, states daughter has been in a lot of pain for the past couple of days and states Dr Epimenio Foot gave daughter a shot in the back for sciatic nerve in the past that helped a lot, the hospital has advised that they do these shots all the time and can give a shot as long as they have an order, please call patient [phone].

## 2015-07-14 ENCOUNTER — Encounter: Payer: Self-pay | Admitting: Neurology

## 2015-07-14 ENCOUNTER — Ambulatory Visit (INDEPENDENT_AMBULATORY_CARE_PROVIDER_SITE_OTHER): Payer: Medicare Other | Admitting: Neurology

## 2015-07-14 VITALS — BP 120/84 | HR 76 | Resp 14 | Ht 64.0 in | Wt 105.6 lb

## 2015-07-14 DIAGNOSIS — G35 Multiple sclerosis: Secondary | ICD-10-CM

## 2015-07-14 DIAGNOSIS — M6249 Contracture of muscle, multiple sites: Secondary | ICD-10-CM | POA: Diagnosis not present

## 2015-07-14 DIAGNOSIS — M5432 Sciatica, left side: Secondary | ICD-10-CM | POA: Diagnosis not present

## 2015-07-14 DIAGNOSIS — F32A Depression, unspecified: Secondary | ICD-10-CM

## 2015-07-14 DIAGNOSIS — R208 Other disturbances of skin sensation: Secondary | ICD-10-CM | POA: Diagnosis not present

## 2015-07-14 DIAGNOSIS — R5383 Other fatigue: Secondary | ICD-10-CM

## 2015-07-14 DIAGNOSIS — R269 Unspecified abnormalities of gait and mobility: Secondary | ICD-10-CM | POA: Diagnosis not present

## 2015-07-14 DIAGNOSIS — M62838 Other muscle spasm: Secondary | ICD-10-CM | POA: Insufficient documentation

## 2015-07-14 DIAGNOSIS — F329 Major depressive disorder, single episode, unspecified: Secondary | ICD-10-CM

## 2015-07-14 MED ORDER — AMPHETAMINE-DEXTROAMPHETAMINE 10 MG PO TABS: 10.0000 mg | ORAL_TABLET | Freq: Two times a day (BID) | ORAL | Status: DC

## 2015-07-14 MED ORDER — LAMOTRIGINE 100 MG PO TABS: 100.0000 mg | ORAL_TABLET | Freq: Two times a day (BID) | ORAL | Status: DC

## 2015-07-14 NOTE — Progress Notes (Signed)
u  [facility]  PATIENT: Sabrina [name] DOB: 1980/09/11    _________________________________   HISTORICAL  CHIEF COMPLAINT:  Chief Complaint  Patient presents with  . Multiple Sclerosis    Sts. she continues to tolerate Tysabri well.  Last infusion was at 07-08-15 at [facility].  JCV last checvked 10-29-14 and was negative at 0.16.  She is ambulatory with a rolling walker today--due to increased left hip/leg pain and balance.  Sts. inj. given at last ov  helped for 1-2-weeks/fim    HISTORY OF PRESENT ILLNESS:  Sabrina [name] is a 35 year old woman with multiple sclerosis.  She reports that her MS has been mostly stable since the last visit but pain is worse.   She has had Tysabri infusions x 8 infusions in [place].     She denies any MS exacerbation.   However, left sciatic type pain is much worse.    A piriformis muscle injection last visit helped a lot x 10 days and then started wearing off over the next week.    She felt bad on gabapentin and Lyrica and Tegretol.  Lamictal may be helping slightly.    She also notes mild weakness and stiffness in the left arm.     Gait/strength/sensation/pain:   She reports gait is worse with the pain and she is using walker again.  No recent falls. The left leg is worse than the right with mild weakness and some spasticity.  She states her endurance is poor.    Pain:    Pain continues to be a problem for her, mostly in the back and down the legs, left worse than right.   Pain increases with sitting a long time (or even a short time on left buttock) or standing a while.  Vision:      She notes no change in the right blurry vision.    She denies any diplopia.  Bladder:  She notes stable urinary frequency and urgency.   In 2013, she had some incontinence.   She has 1 - 2 times nocturia.     Fatigue/sleep:   She feels physical > mental fatigue.  Adderall is helping longer than the ritalin did.    She tolerates it  well.   No anorexia.    She feels she sleeps well when pain is mild but sleeps poorly when in more pain.   She is noting less RLS/PLMS symptoms.    Mood/cognition:   She has depression but feels better than last year, despite her pain.   She had panic attacks and takes clonazepam with benefit.     She has rare difficulty with coming up with the right word.   Memory is better.     She is on Pristiq and lamotrigine  MS History:  She had right optic neuritis in December 2012 and had an MRI of the brain showing optic nerve inflammation and many white matter spots.    She saw Dr. Macon Large in [place].   She was started on Copaxone.  She likely had an exacerbation in 2013 when she had trouble walking x 2 months.   She received a few days of IV Solu-Medrol.  Last year, she also received a few days of IV Solu-Medrol but she is uncertain   She felt she did well for the first 2 years but switched to Gilenya because she was tired of the shots last year.   At first, she tolerated it well but then her depression got  much worse and she had some stomach issues so she returned to Copaxone last month (20 mg daily).   MRI early 2016 showed multiple old MS plaques and 1  Enhancing focus in the right frontal lobe prompting change to Tysabi.     REVIEW OF SYSTEMS: Constitutional: No fevers, chills, sweats, or change in appetite.  Notes a lot of fatigue Eyes: No visual changes, double vision, eye pain Ear, nose and throat: No hearing loss, ear pain, nasal congestion, sore throat Cardiovascular: No chest pain, palpitations Respiratory: No shortness of breath at rest or with exertion.   No wheezes GastrointestinaI: No nausea, vomiting, diarrhea, abdominal pain, fecal incontinence Genitourinary: as above. Musculoskeletal: No neck pain, back pain Integumentary: No rash, pruritus, skin lesions Neurological: as above Psychiatric: Notes some depression and anxiety Endocrine: No palpitations, diaphoresis, change in appetite,  change in weigh or increased thirst Hematologic/Lymphatic: No anemia, purpura, petechiae. Allergic/Immunologic: No itchy/runny eyes, nasal congestion, recent allergic reactions, rashes  ALLERGIES: Allergies  Allergen Reactions  . Amoxicillin   . Penicillins   . Sulfa Antibiotics   . Codeine Nausea And Vomiting    HOME MEDICATIONS:  Current outpatient prescriptions:  .  amphetamine-dextroamphetamine (ADDERALL) 10 MG tablet, Take 1 tablet (10 mg total) by mouth 2 (two) times daily with a meal., Disp: 60 tablet, Rfl: 0 .  b complex vitamins tablet, every day., Disp: , Rfl:  .  Cholecalciferol 5000 UNITS TABS, Take by mouth., Disp: , Rfl:  .  clonazePAM (KLONOPIN) 0.5 MG tablet, 1 tab po every morning, 1 tab po at noon, and 2 tabs po every evening, Disp: , Rfl:  .  DHA-EPA-VITAMIN E PO, Take by mouth., Disp: , Rfl:  .  HYDROcodone-acetaminophen (NORCO) 10-325 MG per tablet, Take 1 tablet by mouth 2 (two) times daily as needed., Disp: , Rfl: 0 .  lamoTRIgine (LAMICTAL) 100 MG tablet, Take 1 tablet (100 mg total) by mouth daily., Disp: 60 tablet, Rfl: 11 .  methocarbamol (ROBAXIN) 500 MG tablet, Take 1 tablet (500 mg total) by mouth 3 (three) times daily., Disp: 90 tablet, Rfl: 5 .  Multiple Vitamin (MULTI VITAMIN MENS) tablet, Take by mouth., Disp: , Rfl:  .  natalizumab (TYSABRI) 300 MG/15ML injection, Inject 15 mLs (300 mg total) into the vein every 30 (thirty) days., Disp: 15 mL, Rfl: 12 .  Norgestimate-Ethinyl Estradiol Triphasic 0.18/0.215/0.25 MG-35 MCG tablet, Take by mouth., Disp: , Rfl:  .  OXYCODONE 10 MG 12 hr tablet, TK 1 T PO BID, Disp: , Rfl: 0 .  PRISTIQ 50 MG 24 hr tablet, , Disp: , Rfl:  .  cyclobenzaprine (FLEXERIL) 10 MG tablet, Take 10 mg by mouth 2 (two) times daily. Reported on 07/14/2015, Disp: , Rfl: 3  PAST MEDICAL HISTORY: Past Medical History  Diagnosis Date  . Multiple sclerosis (HCC) 10/28/2014  . Headache   . Vision abnormalities     PAST SURGICAL  HISTORY: History reviewed. No pertinent past surgical history.  FAMILY HISTORY: Family History  Problem Relation Age of Onset  . Healthy Mother   . Diabetes type II Father   . Prostate cancer Father     SOCIAL HISTORY:  Social History   Social History  . Marital Status: Single    Spouse Name: N/A  . Number of Children: N/A  . Years of Education: N/A   Occupational History  . Not on file.   Social History Main Topics  . Smoking status: Current Every Day Smoker -- 0.50 packs/day  Types: Cigarettes  . Smokeless tobacco: Not on file  . Alcohol Use: No  . Drug Use: No  . Sexual Activity: Not on file   Other Topics Concern  . Not on file   Social History Narrative     PHYSICAL EXAM  Filed Vitals:   07/14/15 1355  BP: 120/84  Pulse: 76  Resp: 14  Height:  (1.626 m)  Weight: 105 lb 9.6 oz (47.9 kg)    Body mass index is 18.12 kg/(m^2).   General: The patient is well-developed and well-nourished and in no acute distress  Musculoskeletal:   She is very tender over the left piriformis muscle. Paraspinal muscles are just mildly tender.   Neurologic Exam  Mental status:  The patient is alert and oriented x 3 at the time of the examination. The patient has apparent normal recent and remote memory, with a mildly reduced attention span and concentration ability.     Cranial nerves: Extraocular movements are full.  Facial symmetry is present. There is good facial sensation to soft touch bilaterally.Facial strength is normal.  Trapezius and sternocleidomastoid strength is normal. No dysarthria is noted.  The tongue is midline, and the patient has symmetric elevation of the soft palate. No obvious hearing deficits are noted.  Motor:  Muscle bulk is normal.   Tone is mildly increased in legs. Strength is  5 / 5 in all 4 extremities.   Sensory: Sensory testing is intact to  touch and vibration sensation in her arms. She has allodynia in the lateral left  foot..  Coordination: Cerebellar testing reveals good finger-nose-finger bilaterally.  Gait and station: Station is normal.   Gait is minimally wide. Tandem gait is mildly wide. Romberg is negative.   Reflexes: Deep tendon reflexes are symmetric and increased bilaterally with 2 beats clonue at the ankles.      DIAGNOSTIC DATA (LABS, IMAGING, TESTING) - I reviewed patient records, labs, notes, testing and imaging myself where available.       ASSESSMENT AND PLAN  Multiple sclerosis (HCC) - Plan: Stratify JCV Antibody Test ([facility]), CBC with Differential  Left sided sciatica  Other fatigue  Gait disorder  Dysesthesia  Muscle spasticity  Clinical depression     1.   Continue Tysabri.     Check JCV Ab and CBC 2.   Renew Adderall for fatigue/attentional deficit. 3.   Trigger point inject the left piriformis muscle with 80 mg Depo-Medrol in 4 mL Marcaine. She tolerated the injection well and there were no complications. 4.   She would likely benefit from Botox for MS-related spasticity into the piriformis muscle.    We will see if she can get authorized.  rtc 4 months, sooner if problems   Murice Barbar A. Epimenio Foot, MD, PhD 07/14/2015, 1:57 PM Certified in Neurology, Clinical Neurophysiology, Sleep Medicine, Pain Medicine and Neuroimaging  [facility] [address], [address] [place], [place] [address] [phone]   6

## 2015-07-15 LAB — CBC WITH DIFFERENTIAL/PLATELET
BASOS ABS: 0 10*3/uL (ref 0.0–0.2)
Basos: 0 %
EOS (ABSOLUTE): 0.1 10*3/uL (ref 0.0–0.4)
EOS: 1 %
HEMATOCRIT: 39.6 % (ref 34.0–46.6)
HEMOGLOBIN: 13.4 g/dL (ref 11.1–15.9)
Immature Grans (Abs): 0 10*3/uL (ref 0.0–0.1)
Immature Granulocytes: 1 %
LYMPHS ABS: 3.1 10*3/uL (ref 0.7–3.1)
Lymphs: 39 %
MCH: 31.5 pg (ref 26.6–33.0)
MCHC: 33.8 g/dL (ref 31.5–35.7)
MCV: 93 fL (ref 79–97)
MONOCYTES: 6 %
Monocytes Absolute: 0.5 10*3/uL (ref 0.1–0.9)
NEUTROS ABS: 4.3 10*3/uL (ref 1.4–7.0)
Neutrophils: 53 %
Platelets: 326 10*3/uL (ref 150–379)
RBC: 4.26 x10E6/uL (ref 3.77–5.28)
RDW: 14.7 % (ref 12.3–15.4)
WBC: 8.1 10*3/uL (ref 3.4–10.8)

## 2015-07-21 ENCOUNTER — Encounter: Payer: Self-pay | Admitting: *Deleted

## 2015-08-05 ENCOUNTER — Telehealth: Payer: Self-pay | Admitting: Neurology

## 2015-08-05 NOTE — Telephone Encounter (Signed)
Patient is calling. Her primary insurance is Medicare [id number], Secondary is [place] [id number] effective since 2013-telephone#-[phone].

## 2015-08-05 NOTE — Telephone Encounter (Signed)
Patients verification came back not covered. Called to see if she had changed policies. There was no answer so i left a VM asking her to call me back. If she calls back would you mind getting her new policy company, ID, Phone number, and who the main policy holder is? Thank you.

## 2015-08-06 DIAGNOSIS — G35 Multiple sclerosis: Secondary | ICD-10-CM | POA: Diagnosis not present

## 2015-08-07 ENCOUNTER — Ambulatory Visit (INDEPENDENT_AMBULATORY_CARE_PROVIDER_SITE_OTHER): Payer: Medicare Other | Admitting: Neurology

## 2015-08-07 ENCOUNTER — Encounter: Payer: Self-pay | Admitting: Neurology

## 2015-08-07 VITALS — BP 138/86 | HR 68 | Resp 14 | Ht 64.0 in | Wt 105.0 lb

## 2015-08-07 DIAGNOSIS — R269 Unspecified abnormalities of gait and mobility: Secondary | ICD-10-CM

## 2015-08-07 DIAGNOSIS — F329 Major depressive disorder, single episode, unspecified: Secondary | ICD-10-CM | POA: Diagnosis not present

## 2015-08-07 DIAGNOSIS — F32A Depression, unspecified: Secondary | ICD-10-CM

## 2015-08-07 DIAGNOSIS — R208 Other disturbances of skin sensation: Secondary | ICD-10-CM

## 2015-08-07 DIAGNOSIS — M62838 Other muscle spasm: Secondary | ICD-10-CM

## 2015-08-07 DIAGNOSIS — M5432 Sciatica, left side: Secondary | ICD-10-CM

## 2015-08-07 DIAGNOSIS — R5383 Other fatigue: Secondary | ICD-10-CM | POA: Diagnosis not present

## 2015-08-07 DIAGNOSIS — G35 Multiple sclerosis: Secondary | ICD-10-CM

## 2015-08-07 DIAGNOSIS — M6249 Contracture of muscle, multiple sites: Secondary | ICD-10-CM | POA: Diagnosis not present

## 2015-08-07 MED ORDER — BACLOFEN 10 MG PO TABS: 10.0000 mg | ORAL_TABLET | Freq: Three times a day (TID) | ORAL | Status: DC

## 2015-08-07 NOTE — Progress Notes (Signed)
u  [facility]  PATIENT: Sabrina [name] DOB: 1981/01/23    _________________________________   HISTORICAL  CHIEF COMPLAINT:  Chief Complaint  Patient presents with  . Multiple Sclerosis    Sts. she continues to tolerate Tysabri well.  Last infusion 2 days ago.  JCV ab last checked 07-14-15 and was negative at 0.12.  She is here today with c/o worsening lbp radiating down left leg, now radiating down right leg as well.  She would like to discuss tx. options/fim    HISTORY OF PRESENT ILLNESS:  Sabrina [name] is a 35 year old woman with multiple sclerosis.  She reports that her MS has been mostly stable since the last visit but pain is worse.   She has had Tysabri infusions in [place] (x 9 months or so).     She denies any MS exacerbation.     Sciatica:    She has left > right leg pain.   Gait is worse.   Her muscles feel tired and achy.    She feels both knees are going to buckle.     A piriformis muscle injection last visit helped a lot x 10 days or a bit longer.       She felt bad on gabapentin and Lyrica and Tegretol.  Lamictal may be helping slightly.    She also notes mild weakness and stiffness in the left arm.   Lumbar MRI was normal.      Gait/strength/sensation/pain:   She reports gait is worse with the pain and she is using walker again.  No recent falls. The left leg is worse than the right with mild weakness and some spasticity.  She states her endurance is poor.    Pain:    Pain continues to be a problem for her, mostly in the back and down the legs, left worse than right.   Pain increases with sitting a long time (or even a short time on left buttock) or standing a while.  Vision:      She notes no change in the right blurry vision.    She denies any diplopia.  Bladder:  She notes stable urinary frequency and urgency.   In 2013, she had some incontinence.   She has 1 - 2 times nocturia.     Fatigue/sleep:   She feels physical > mental fatigue.  Adderall is  helping longer than the ritalin did.    She tolerates it well.   No anorexia.    She feels she sleeps well when pain is mild but sleeps poorly when in more pain.   She is noting less RLS/PLMS symptoms.    Mood/cognition:   She has depression but feels better than last year, despite her pain.   She had panic attacks and takes clonazepam with benefit.     She has rare difficulty with coming up with the right word.   Memory is better.     She is on Pristiq and lamotrigine  MS History:  She had right optic neuritis in December 2012 and had an MRI of the brain showing optic nerve inflammation and many white matter spots.    She saw Dr. Macon Large in [place].   She was started on Copaxone.  She likely had an exacerbation in 2013 when she had trouble walking x 2 months.   She received a few days of IV Solu-Medrol.  Last year, she also received a few days of IV Solu-Medrol but she is uncertain   She felt  she did well for the first 2 years but switched to Gilenya because she was tired of the shots last year.   At first, she tolerated it well but then her depression got much worse and she had some stomach issues so she returned to Copaxone last month (20 mg daily).   MRI early 2016 showed multiple old MS plaques and 1  Enhancing focus in the right frontal lobe prompting change to Tysabi.     REVIEW OF SYSTEMS: Constitutional: No fevers, chills, sweats, or change in appetite.  Notes a lot of fatigue Eyes: No visual changes, double vision, eye pain Ear, nose and throat: No hearing loss, ear pain, nasal congestion, sore throat Cardiovascular: No chest pain, palpitations Respiratory: No shortness of breath at rest or with exertion.   No wheezes GastrointestinaI: No nausea, vomiting, diarrhea, abdominal pain, fecal incontinence Genitourinary: as above. Musculoskeletal: No neck pain, back pain Integumentary: No rash, pruritus, skin lesions Neurological: as above Psychiatric: Notes some depression and  anxiety Endocrine: No palpitations, diaphoresis, change in appetite, change in weigh or increased thirst Hematologic/Lymphatic: No anemia, purpura, petechiae. Allergic/Immunologic: No itchy/runny eyes, nasal congestion, recent allergic reactions, rashes  ALLERGIES: Allergies  Allergen Reactions  . Amoxicillin   . Penicillins   . Sulfa Antibiotics   . Codeine Nausea And Vomiting    HOME MEDICATIONS:  Current outpatient prescriptions:  .  amphetamine-dextroamphetamine (ADDERALL) 10 MG tablet, Take 1 tablet (10 mg total) by mouth 2 (two) times daily with a meal., Disp: 60 tablet, Rfl: 0 .  b complex vitamins tablet, every day., Disp: , Rfl:  .  Cholecalciferol 5000 UNITS TABS, Take by mouth., Disp: , Rfl:  .  clonazePAM (KLONOPIN) 0.5 MG tablet, 1 tab po every morning, 1 tab po at noon, and 2 tabs po every evening, Disp: , Rfl:  .  cyclobenzaprine (FLEXERIL) 10 MG tablet, Take 10 mg by mouth 2 (two) times daily. Reported on 07/14/2015, Disp: , Rfl: 3 .  DHA-EPA-VITAMIN E PO, Take by mouth., Disp: , Rfl:  .  HYDROcodone-acetaminophen (NORCO) 10-325 MG per tablet, Take 1 tablet by mouth 2 (two) times daily as needed., Disp: , Rfl: 0 .  lamoTRIgine (LAMICTAL) 100 MG tablet, Take 1 tablet (100 mg total) by mouth 2 (two) times daily., Disp: 60 tablet, Rfl: 11 .  methocarbamol (ROBAXIN) 500 MG tablet, Take 1 tablet (500 mg total) by mouth 3 (three) times daily., Disp: 90 tablet, Rfl: 5 .  Multiple Vitamin (MULTI VITAMIN MENS) tablet, Take by mouth., Disp: , Rfl:  .  natalizumab (TYSABRI) 300 MG/15ML injection, Inject 15 mLs (300 mg total) into the vein every 30 (thirty) days., Disp: 15 mL, Rfl: 12 .  Norgestimate-Ethinyl Estradiol Triphasic 0.18/0.215/0.25 MG-35 MCG tablet, Take by mouth., Disp: , Rfl:  .  OXYCODONE 10 MG 12 hr tablet, TK 1 T PO BID, Disp: , Rfl: 0 .  PRISTIQ 50 MG 24 hr tablet, , Disp: , Rfl:   PAST MEDICAL HISTORY: Past Medical History  Diagnosis Date  . Multiple sclerosis  (HCC) 10/28/2014  . Headache   . Vision abnormalities     PAST SURGICAL HISTORY: History reviewed. No pertinent past surgical history.  FAMILY HISTORY: Family History  Problem Relation Age of Onset  . Healthy Mother   . Diabetes type II Father   . Prostate cancer Father     SOCIAL HISTORY:  Social History   Social History  . Marital Status: Single    Spouse Name: N/A  . Number  of Children: N/A  . Years of Education: N/A   Occupational History  . Not on file.   Social History Main Topics  . Smoking status: Current Every Day Smoker -- 0.50 packs/day    Types: Cigarettes  . Smokeless tobacco: Not on file  . Alcohol Use: No  . Drug Use: No  . Sexual Activity: Not on file   Other Topics Concern  . Not on file   Social History Narrative     PHYSICAL EXAM  Filed Vitals:   08/07/15 0950  BP: 138/86  Pulse: 68  Resp: 14  Height: 5\' 4"  (1.626 m)  Weight: 105 lb (47.628 kg)    Body mass index is 18.01 kg/(m^2).   General: The patient is well-developed and well-nourished and in no acute distress  Musculoskeletal:   She is very tender over the left > right piriformis muscle. Paraspinal muscles are just mildly tender.   Neurologic Exam  Mental status:  The patient is alert and oriented x 3 at the time of the examination. The patient has apparent normal recent and remote memory, with a mildly reduced attention span and concentration ability.     Cranial nerves: Extraocular movements are full.  Facial symmetry is present. There is good facial sensation to soft touch bilaterally.Facial strength is normal.  Trapezius and sternocleidomastoid strength is normal. No dysarthria is noted.   No obvious hearing deficits are noted.  Motor:  Muscle bulk is normal.   Tone is mildly increased in legs. Strength is  4+ / 5 at anjles/toes but effort may have been poor.   Sensory: Sensory testing is intact to  touch and vibration sensation in her arms. She has allodynia in the  lateral left foot..  Coordination: Cerebellar testing reveals good finger-nose-finger bilaterally.  Gait and station: Station is normal.   Gait is minimally wide. Tandem gait is mildly wide. Romberg is negative.   Reflexes: Deep tendon reflexes are symmetric and increased bilaterally with 2 beats clonue at the ankles.      DIAGNOSTIC DATA (LABS, IMAGING, TESTING) - I reviewed patient records, labs, notes, testing and imaging myself where available.       ASSESSMENT AND PLAN  Exacerbation of multiple sclerosis (HCC)  Multiple sclerosis (HCC)  Left sided sciatica  Clinical depression  Dysesthesia  Gait disorder  Muscle spasticity  Other fatigue    1.   Continue Tysabri.     2.   continue  Adderall for fatigue/attentional deficit. 3.   IV Solu-Medrol 1 gram today --- if no better, consider 2 more days more at [facility] next week 4.   Return in 2 weeks for Botox for MS-related spasticity into the piriformis muscles.    5.  MRI cervical spine w/wo rtc 4 months, sooner if problems   Richard A. Epimenio Foot, MD, PhD 08/07/2015, 9:56 AM Certified in Neurology, Clinical Neurophysiology, Sleep Medicine, Pain Medicine and Neuroimaging  [facility] [address], [address] [place], [place] [address] [phone]

## 2015-08-10 DIAGNOSIS — G35 Multiple sclerosis: Secondary | ICD-10-CM | POA: Diagnosis not present

## 2015-08-10 DIAGNOSIS — G8929 Other chronic pain: Secondary | ICD-10-CM | POA: Diagnosis not present

## 2015-08-10 NOTE — Telephone Encounter (Signed)
Noted  

## 2015-08-11 ENCOUNTER — Telehealth: Payer: Self-pay | Admitting: Neurology

## 2015-08-11 ENCOUNTER — Other Ambulatory Visit: Payer: Medicare Other

## 2015-08-11 NOTE — Telephone Encounter (Signed)
Kelly with Botox reimbursement called and would like a call back about pts insurance , [phone] ext: [phone]. May leave vm.

## 2015-08-11 NOTE — Telephone Encounter (Signed)
Called Kelly back and she did not answer. Left her a VM asking her to return my call.

## 2015-08-11 NOTE — Telephone Encounter (Signed)
Tresa Endo stated that the patient has a medicare advantage plan but we do not have that number. I called the patient to ask for the number and she did not answer so I left a VM.

## 2015-08-17 ENCOUNTER — Ambulatory Visit
Admission: RE | Admit: 2015-08-17 | Discharge: 2015-08-17 | Disposition: A | Discharge status: Home / Self Care | Payer: Medicare Other | Source: Ambulatory Visit | Attending: Neurology | Admitting: Neurology

## 2015-08-17 DIAGNOSIS — G35 Multiple sclerosis: Secondary | ICD-10-CM | POA: Diagnosis not present

## 2015-08-17 DIAGNOSIS — R208 Other disturbances of skin sensation: Secondary | ICD-10-CM

## 2015-08-17 DIAGNOSIS — R269 Unspecified abnormalities of gait and mobility: Secondary | ICD-10-CM | POA: Diagnosis not present

## 2015-08-17 MED ORDER — GADOBENATE DIMEGLUMINE 529 MG/ML IV SOLN: 9.0000 mL | Freq: Once | INTRAVENOUS | Status: DC | PRN

## 2015-08-17 NOTE — Telephone Encounter (Signed)
Spoke with patient and informed her of what was going. She stated that she called medicare and they stated that she is not part of an advantage program.

## 2015-08-18 ENCOUNTER — Ambulatory Visit: Payer: Medicare Other | Admitting: Neurology

## 2015-08-18 ENCOUNTER — Telehealth: Payer: Self-pay | Admitting: *Deleted

## 2015-08-18 NOTE — Telephone Encounter (Signed)
-----   Message from Asa Lente, MD sent at 08/18/2015  8:45 AM EST ----- Please let her know that the MRI of the cervical spine showed several old MS plaques but nothing looked new,.

## 2015-08-18 NOTE — Telephone Encounter (Signed)
I have spoken with Kanai this morning and per RAS, advised that mri c-spine showed old ms lesions, but nothing looks new.  She verbalized understanding of same/fim

## 2015-08-24 ENCOUNTER — Other Ambulatory Visit: Payer: Self-pay | Admitting: *Deleted

## 2015-08-24 ENCOUNTER — Encounter: Payer: Self-pay | Admitting: Neurology

## 2015-08-24 ENCOUNTER — Ambulatory Visit (INDEPENDENT_AMBULATORY_CARE_PROVIDER_SITE_OTHER): Payer: Medicare Other | Admitting: Neurology

## 2015-08-24 VITALS — BP 118/76 | HR 78 | Resp 16 | Ht 64.0 in | Wt 103.2 lb

## 2015-08-24 DIAGNOSIS — M6249 Contracture of muscle, multiple sites: Secondary | ICD-10-CM | POA: Diagnosis not present

## 2015-08-24 DIAGNOSIS — G35 Multiple sclerosis: Secondary | ICD-10-CM

## 2015-08-24 DIAGNOSIS — M62838 Other muscle spasm: Secondary | ICD-10-CM

## 2015-08-24 MED ORDER — AMPHETAMINE-DEXTROAMPHETAMINE 10 MG PO TABS: 10.0000 mg | ORAL_TABLET | Freq: Two times a day (BID) | ORAL | Status: DC

## 2015-08-24 NOTE — Progress Notes (Signed)
u  [facility]  PATIENT: Sabrina [name] DOB: 24-Jan-1981    _________________________________   HISTORICAL  CHIEF COMPLAINT:  Chief Complaint  Patient presents with  . Multiple Sclerosis    Here for Botox--piriformis muscles for spasticity/fim    HISTORY OF PRESENT ILLNESS:  Sabrina [name] is a 35 year old woman with multiple sclerosis.  She has spasticity in her legs, left greater than right and has severe tenderness over the piriformis muscles, left more than right. Piriformis muscle trigger point injections have helped her for one to 2 weeks.        ALLERGIES: Allergies  Allergen Reactions  . Amoxicillin   . Penicillins   . Sulfa Antibiotics   . Codeine Nausea And Vomiting    HOME MEDICATIONS:  Current outpatient prescriptions:  .  amphetamine-dextroamphetamine (ADDERALL) 10 MG tablet, Take 1 tablet (10 mg total) by mouth 2 (two) times daily with a meal., Disp: 60 tablet, Rfl: 0 .  b complex vitamins tablet, every day., Disp: , Rfl:  .  baclofen (LIORESAL) 10 MG tablet, Take 1 tablet (10 mg total) by mouth 3 (three) times daily., Disp: 90 each, Rfl: 3 .  Cholecalciferol 5000 UNITS TABS, Take by mouth., Disp: , Rfl:  .  clonazePAM (KLONOPIN) 0.5 MG tablet, 1 tab po every morning, 1 tab po at noon, and 2 tabs po every evening, Disp: , Rfl:  .  cyclobenzaprine (FLEXERIL) 10 MG tablet, Take 10 mg by mouth 2 (two) times daily. Reported on 07/14/2015, Disp: , Rfl: 3 .  DHA-EPA-VITAMIN E PO, Take by mouth., Disp: , Rfl:  .  HYDROcodone-acetaminophen (NORCO) 10-325 MG per tablet, Take 1 tablet by mouth 2 (two) times daily as needed., Disp: , Rfl: 0 .  lamoTRIgine (LAMICTAL) 100 MG tablet, Take 1 tablet (100 mg total) by mouth 2 (two) times daily., Disp: 60 tablet, Rfl: 11 .  methocarbamol (ROBAXIN) 500 MG tablet, Take 1 tablet (500 mg total) by mouth 3 (three) times daily., Disp: 90 tablet, Rfl: 5 .  Multiple Vitamin (MULTI VITAMIN MENS) tablet, Take by mouth.,  Disp: , Rfl:  .  natalizumab (TYSABRI) 300 MG/15ML injection, Inject 15 mLs (300 mg total) into the vein every 30 (thirty) days., Disp: 15 mL, Rfl: 12 .  Norgestimate-Ethinyl Estradiol Triphasic 0.18/0.215/0.25 MG-35 MCG tablet, Take by mouth., Disp: , Rfl:  .  OXYCODONE 10 MG 12 hr tablet, TK 1 T PO BID, Disp: , Rfl: 0 .  PRISTIQ 50 MG 24 hr tablet, , Disp: , Rfl:   PAST MEDICAL HISTORY: Past Medical History  Diagnosis Date  . Multiple sclerosis (HCC) 10/28/2014  . Headache   . Vision abnormalities     PAST SURGICAL HISTORY: History reviewed. No pertinent past surgical history.  FAMILY HISTORY: Family History  Problem Relation Age of Onset  . Healthy Mother   . Diabetes type II Father   . Prostate cancer Father     SOCIAL HISTORY:  Social History   Social History  . Marital Status: Single    Spouse Name: N/A  . Number of Children: N/A  . Years of Education: N/A   Occupational History  . Not on file.   Social History Main Topics  . Smoking status: Current Every Day Smoker -- 0.50 packs/day    Types: Cigarettes  . Smokeless tobacco: Not on file  . Alcohol Use: No  . Drug Use: No  . Sexual Activity: Not on file   Other Topics Concern  . Not on file  Social History Narrative     PHYSICAL EXAM  Filed Vitals:   08/24/15 0959  BP: 118/76  Pulse: 78  Resp: 16  Height: 5\' 4"  (1.626 m)  Weight: 103 lb 3.2 oz (46.811 kg)    Body mass index is 17.71 kg/(m^2).   General: The patient is well-developed and well-nourished and in no acute distress  Musculoskeletal:   She is very tender over the left > right piriformis muscle. Paraspinal muscles are just mildly tender.  Motor:  Muscle bulk is normal.   Tone is mildly increased in legs. Strength is  4+ / 5 at anjles/toes but effort may have been poor.   Gait and station: Station is normal.   Gait is minimally wide. Tandem gait is mildly wide.    Reflexes: Deep tendon reflexes are symmetric and increased  bilaterally with 2 beats clonue at the ankles.      DIAGNOSTIC DATA (LABS, IMAGING, TESTING) - I reviewed patient records, labs, notes, testing and imaging myself where available.       ASSESSMENT AND PLAN  Multiple sclerosis (HCC)  Muscle spasticity  1.   Using EMG guidance, the left piriformis muscle injected with 125 units Botox in the right piriformis muscle was injected with 75 units of Botox. She tolerated the procedure well. 2.   She will return to see me in 3 months or sooner if there are new or worsening neurologic symptoms.  Caydn Justen A. Epimenio Foot, MD, PhD 08/24/2015, 10:25 AM Certified in Neurology, Clinical Neurophysiology, Sleep Medicine, Pain Medicine and Neuroimaging  [facility] [address], [address] [place], [place] [address] [phone]

## 2015-08-24 NOTE — Telephone Encounter (Signed)
error/fim. 

## 2015-09-03 DIAGNOSIS — G35 Multiple sclerosis: Secondary | ICD-10-CM | POA: Diagnosis not present

## 2015-09-30 DIAGNOSIS — G35 Multiple sclerosis: Secondary | ICD-10-CM | POA: Diagnosis not present

## 2015-10-13 ENCOUNTER — Telehealth: Payer: Self-pay | Admitting: Neurology

## 2015-10-13 NOTE — Telephone Encounter (Signed)
error 

## 2015-10-14 ENCOUNTER — Ambulatory Visit: Payer: Medicare Other | Admitting: Neurology

## 2015-10-16 ENCOUNTER — Encounter: Payer: Self-pay | Admitting: Neurology

## 2015-10-19 ENCOUNTER — Ambulatory Visit (INDEPENDENT_AMBULATORY_CARE_PROVIDER_SITE_OTHER): Payer: Medicare Other | Admitting: Neurology

## 2015-10-19 ENCOUNTER — Encounter: Payer: Self-pay | Admitting: Neurology

## 2015-10-19 VITALS — BP 128/80 | HR 68 | Resp 14 | Ht 64.0 in | Wt 98.0 lb

## 2015-10-19 DIAGNOSIS — G35 Multiple sclerosis: Secondary | ICD-10-CM | POA: Diagnosis not present

## 2015-10-19 DIAGNOSIS — F09 Unspecified mental disorder due to known physiological condition: Secondary | ICD-10-CM | POA: Diagnosis not present

## 2015-10-19 DIAGNOSIS — R350 Frequency of micturition: Secondary | ICD-10-CM | POA: Diagnosis not present

## 2015-10-19 DIAGNOSIS — F411 Generalized anxiety disorder: Secondary | ICD-10-CM | POA: Diagnosis not present

## 2015-10-19 DIAGNOSIS — R208 Other disturbances of skin sensation: Secondary | ICD-10-CM | POA: Diagnosis not present

## 2015-10-19 DIAGNOSIS — R5383 Other fatigue: Secondary | ICD-10-CM | POA: Diagnosis not present

## 2015-10-19 DIAGNOSIS — M5432 Sciatica, left side: Secondary | ICD-10-CM | POA: Diagnosis not present

## 2015-10-19 DIAGNOSIS — R269 Unspecified abnormalities of gait and mobility: Secondary | ICD-10-CM | POA: Diagnosis not present

## 2015-10-19 DIAGNOSIS — F329 Major depressive disorder, single episode, unspecified: Secondary | ICD-10-CM

## 2015-10-19 DIAGNOSIS — F32A Depression, unspecified: Secondary | ICD-10-CM

## 2015-10-19 MED ORDER — AMPHETAMINE-DEXTROAMPHETAMINE 10 MG PO TABS: 10.0000 mg | ORAL_TABLET | Freq: Two times a day (BID) | ORAL | Status: DC

## 2015-10-19 NOTE — Progress Notes (Signed)
u  [facility]  PATIENT: Sabrina [name] DOB: 1981/02/16    _________________________________   HISTORICAL  CHIEF COMPLAINT:  Chief Complaint  Patient presents with  . Multiple Sclerosis    Sts. she continues to tolerate Tysabri well.  JCV ab last checked 07-14-15 and was negative at 0.12.  Sts. over the last 3 weeks she has had the flu (swabbed positive for type B), and a stomach virus with n/v.  Has lost several lbs--is now 98lbs.  She is unsure if Botox inj. in bilat piriformis muscles helped/fim  . Spasticity    HISTORY OF PRESENT ILLNESS:  Sabrina [name] is a 35 year old woman with multiple sclerosis.  She reports that her MS has been mostly stable since the last visit.    She had the flu and a sinus infection over the past month and feels more fatigued.  She had nausea, vomiting and URI symptoms.     She ran a low grade fever for a week.  MS:   She has had Tysabri infusions in [place] (has been on x 1 year).   She is tolerating it well but feels more tired x 3 days with each infusion.  She feels her MS has done better than on Copaxone and she is tolerating it well.      She denies any MS exacerbation.     Sciatica:    She has had sciatica/piriformis muscle pain and injections helped short term.   We did EMG guided piriformis injections and they helped a lot x 1 month and then pain returned when she was sick with her viral infection.   She has  left > right leg pain.  Her muscles feel tired and achy.  Legs feel weak.   She felt bad on gabapentin and Lyrica and Tegretol.  Lamictal may be helping slightly.   Lumbar MRI was normal.      Gait/strength/sensation/pain:   She reports gait is worse with the pain and she is using walker for long distances (not in house).  No recent falls. The left leg is worse than the right with mild weakness and some spasticity.  She states her endurance is poor.   Vision:      She notes no change in the right blurry vision.    She denies any  diplopia.  Bladder:  She notes stable urinary frequency and urgency.   In 2013, she had some incontinence.   She has 1 - 2 times nocturia.     Fatigue/sleep:   She notes a lot of physical > mental fatigue.  Adderall is helping longer than the ritalin did.    She tolerates it well.   No anorexia.    She feels she sleeps well when pain is mild but sleeps poorly when in more pain.   She is noting less RLS/PLMS symptoms.    Mood/cognition:   She has depression and anxiety but feels she is doing better than last year.   She had panic attacks and takes clonazepam with benefit.    She notes mild cognitive issues.   She has poor focus and attention She has rare difficulty with coming up with the right word.   Memory is better than last year but she still often needs a hint to recall.     She is on Pristiq and lamotrigine with benefit  MS History:  She had right optic neuritis in December 2012 and had an MRI of the brain showing optic nerve inflammation and many  white matter spots.    She saw Dr. Macon Large in [place].   She was started on Copaxone.  She likely had an exacerbation in 2013 when she had trouble walking x 2 months.   She received a few days of IV Solu-Medrol.  Last year, she also received a few days of IV Solu-Medrol but she is uncertain   She felt she did well for the first 2 years but switched to Gilenya because she was tired of the shots last year.   At first, she tolerated it well but then her depression got much worse and she had some stomach issues so she returned to Copaxone last month (20 mg daily).   MRI early 2016 showed multiple old MS plaques and 1  Enhancing focus in the right frontal lobe prompting change to Tysabi.     REVIEW OF SYSTEMS: Constitutional: No fevers, chills, sweats, or change in appetite.  Notes a lot of fatigue Eyes: No visual changes, double vision, eye pain Ear, nose and throat: No hearing loss, ear pain, nasal congestion, sore throat Cardiovascular: No chest pain,  palpitations Respiratory: No shortness of breath at rest or with exertion.   No wheezes GastrointestinaI: No nausea, vomiting, diarrhea, abdominal pain, fecal incontinence Genitourinary: as above. Musculoskeletal: No neck pain, back pain Integumentary: No rash, pruritus, skin lesions Neurological: as above Psychiatric: Notes some depression and anxiety Endocrine: No palpitations, diaphoresis, change in appetite, change in weigh or increased thirst Hematologic/Lymphatic: No anemia, purpura, petechiae. Allergic/Immunologic: No itchy/runny eyes, nasal congestion, recent allergic reactions, rashes  ALLERGIES: Allergies  Allergen Reactions  . Amoxicillin   . Penicillins   . Sulfa Antibiotics   . Codeine Nausea And Vomiting    HOME MEDICATIONS:  Current outpatient prescriptions:  .  amphetamine-dextroamphetamine (ADDERALL) 10 MG tablet, Take 1 tablet (10 mg total) by mouth 2 (two) times daily with a meal., Disp: 60 tablet, Rfl: 0 .  b complex vitamins tablet, every day., Disp: , Rfl:  .  baclofen (LIORESAL) 10 MG tablet, Take 1 tablet (10 mg total) by mouth 3 (three) times daily., Disp: 90 each, Rfl: 3 .  Cholecalciferol 5000 UNITS TABS, Take by mouth., Disp: , Rfl:  .  clonazePAM (KLONOPIN) 0.5 MG tablet, 1 tab po every morning, 1 tab po at noon, and 2 tabs po every evening, Disp: , Rfl:  .  cyclobenzaprine (FLEXERIL) 10 MG tablet, Take 10 mg by mouth 2 (two) times daily. Reported on 07/14/2015, Disp: , Rfl: 3 .  DHA-EPA-VITAMIN E PO, Take by mouth., Disp: , Rfl:  .  HYDROcodone-acetaminophen (NORCO) 10-325 MG per tablet, Take 1 tablet by mouth 2 (two) times daily as needed., Disp: , Rfl: 0 .  lamoTRIgine (LAMICTAL) 100 MG tablet, Take 1 tablet (100 mg total) by mouth 2 (two) times daily., Disp: 60 tablet, Rfl: 11 .  methocarbamol (ROBAXIN) 500 MG tablet, Take 1 tablet (500 mg total) by mouth 3 (three) times daily., Disp: 90 tablet, Rfl: 5 .  Multiple Vitamin (MULTI VITAMIN MENS)  tablet, Take by mouth., Disp: , Rfl:  .  natalizumab (TYSABRI) 300 MG/15ML injection, Inject 15 mLs (300 mg total) into the vein every 30 (thirty) days., Disp: 15 mL, Rfl: 12 .  Norgestimate-Ethinyl Estradiol Triphasic 0.18/0.215/0.25 MG-35 MCG tablet, Take by mouth., Disp: , Rfl:  .  OXYCODONE 10 MG 12 hr tablet, TK 1 T PO BID, Disp: , Rfl: 0 .  PRISTIQ 50 MG 24 hr tablet, , Disp: , Rfl:   PAST MEDICAL HISTORY:  Past Medical History  Diagnosis Date  . Multiple sclerosis (HCC) 10/28/2014  . Headache   . Vision abnormalities     PAST SURGICAL HISTORY: History reviewed. No pertinent past surgical history.  FAMILY HISTORY: Family History  Problem Relation Age of Onset  . Healthy Mother   . Diabetes type II Father   . Prostate cancer Father     SOCIAL HISTORY:  Social History   Social History  . Marital Status: Single    Spouse Name: N/A  . Number of Children: N/A  . Years of Education: N/A   Occupational History  . Not on file.   Social History Main Topics  . Smoking status: Current Every Day Smoker -- 0.50 packs/day    Types: Cigarettes  . Smokeless tobacco: Not on file  . Alcohol Use: No  . Drug Use: No  . Sexual Activity: Not on file   Other Topics Concern  . Not on file   Social History Narrative     PHYSICAL EXAM  Filed Vitals:   10/19/15 1015  BP: 128/80  Pulse: 68  Resp: 14  Height: 5\' 4"  (1.626 m)  Weight: 98 lb (44.453 kg)    Body mass index is 16.81 kg/(m^2).   General: The patient is well-developed and well-nourished and in no acute distress  Musculoskeletal:   She is tender over the left > right piriformis muscle. Paraspinal muscles are just mildly tender.   Neurologic Exam  Mental status:  The patient is alert and oriented x 3 at the time of the examination. The patient has apparent normal recent and remote memory, with a mildly reduced attention span and concentration ability.     Cranial nerves: Extraocular movements are full.   Facial symmetry is present. There is good facial sensation to soft touch bilaterally.Facial strength is normal.  Trapezius and sternocleidomastoid strength is normal. No dysarthria is noted.   No obvious hearing deficits are noted.  Motor:  Muscle bulk is normal.   Tone is mildly increased in legs. Strength is  5/5.   Sensory: Sensory testing is intact to  touch and vibration sensation in her arms.   Mild altered leg sensation on the left  Coordination: Cerebellar testing reveals good finger-nose-finger bilaterally.  Gait and station: Station is normal.   Gait is mildly wide. Tandem gait is wide. Romberg is negative.   Reflexes: Deep tendon reflexes are symmetric and increased bilaterally with 2 beats clonue at the ankles.      DIAGNOSTIC DATA (LABS, IMAGING, TESTING) - I reviewed patient records, labs, notes, testing and imaging myself where available.       ASSESSMENT AND PLAN  Multiple sclerosis (HCC)  Left sided sciatica  Cognitive dysfunction  Clinical depression  Anxiety, generalized  Gait disorder  Dysesthesia  Other fatigue  Urinary frequency    1.   Continue Tysabri.    We will check an MRI of the brain to make sure that there has not been subclinical progression. If present, consider a change to ocrelizumab or Lemtrada. 2.   continue  Adderall for fatigue/attentional deficit. 3.  Return in May for Botox for MS-related spasticity into the piriformis muscles.    4.  rtc 4 months, sooner if problems   Saagar Tortorella A. Epimenio Foot, MD, PhD 10/19/2015, 10:37 AM Certified in Neurology, Clinical Neurophysiology, Sleep Medicine, Pain Medicine and Neuroimaging  [facility] [address], [address] [place], [place] [address] [phone]

## 2015-10-28 DIAGNOSIS — G35 Multiple sclerosis: Secondary | ICD-10-CM | POA: Diagnosis not present

## 2015-11-04 DIAGNOSIS — R634 Abnormal weight loss: Secondary | ICD-10-CM | POA: Diagnosis not present

## 2015-11-04 DIAGNOSIS — G35 Multiple sclerosis: Secondary | ICD-10-CM | POA: Diagnosis not present

## 2015-11-04 DIAGNOSIS — G8929 Other chronic pain: Secondary | ICD-10-CM | POA: Diagnosis not present

## 2015-11-23 ENCOUNTER — Telehealth: Payer: Self-pay | Admitting: Neurology

## 2015-11-23 NOTE — Telephone Encounter (Signed)
Answer service: pt called to confirm appt  I called pt to confirm with her. Pt still coming.

## 2015-11-24 ENCOUNTER — Inpatient Hospital Stay: Admission: RE | Admit: 2015-11-24 | Payer: Medicare Other | Source: Ambulatory Visit

## 2015-11-24 ENCOUNTER — Ambulatory Visit: Payer: Medicare Other | Admitting: Neurology

## 2015-11-26 DIAGNOSIS — G35 Multiple sclerosis: Secondary | ICD-10-CM | POA: Diagnosis not present

## 2015-12-01 ENCOUNTER — Encounter: Payer: Self-pay | Admitting: *Deleted

## 2015-12-10 ENCOUNTER — Other Ambulatory Visit: Payer: Self-pay | Admitting: Neurology

## 2015-12-10 MED ORDER — AMPHETAMINE-DEXTROAMPHETAMINE 10 MG PO TABS: 10.0000 mg | ORAL_TABLET | Freq: Two times a day (BID) | ORAL | Status: DC

## 2015-12-10 NOTE — Addendum Note (Signed)
Addended by: Candis Schatz I on: 12/10/2015 12:10 PM   Modules accepted: Orders

## 2015-12-10 NOTE — Telephone Encounter (Signed)
Rx. awaiting RAS sig/fim 

## 2015-12-10 NOTE — Telephone Encounter (Signed)
Pt's mother request refill for amphetamine-dextroamphetamine (ADDERALL) 10 MG tablet  pt is out of medication

## 2015-12-10 NOTE — Telephone Encounter (Signed)
Adderall rx. up front [facility]/fim 

## 2015-12-24 DIAGNOSIS — G35 Multiple sclerosis: Secondary | ICD-10-CM | POA: Diagnosis not present

## 2015-12-31 ENCOUNTER — Telehealth: Payer: Self-pay | Admitting: Neurology

## 2015-12-31 NOTE — Telephone Encounter (Signed)
Pt's husband called said MRI has finally been scheduled for 01/08/16 at [facility]. He is also requesting refill for amphetamine-dextroamphetamine (ADDERALL) 10 MG tablet for her.

## 2015-12-31 NOTE — Telephone Encounter (Signed)
Noted that mri is sched.  I have spoken with husband and advised that Adderall was r/f 12-10-15 and rx. is up front [facility] to be picked up.  He verbalized understanding of same/fim

## 2016-01-08 ENCOUNTER — Inpatient Hospital Stay: Admission: RE | Admit: 2016-01-08 | Payer: Medicare Other | Source: Ambulatory Visit

## 2016-01-17 ENCOUNTER — Ambulatory Visit
Admission: RE | Admit: 2016-01-17 | Discharge: 2016-01-17 | Disposition: A | Discharge status: Home / Self Care | Payer: Medicare Other | Source: Ambulatory Visit | Attending: Neurology | Admitting: Neurology

## 2016-01-17 DIAGNOSIS — G35 Multiple sclerosis: Secondary | ICD-10-CM | POA: Diagnosis not present

## 2016-01-17 DIAGNOSIS — R269 Unspecified abnormalities of gait and mobility: Secondary | ICD-10-CM

## 2016-01-17 MED ORDER — GADOBENATE DIMEGLUMINE 529 MG/ML IV SOLN
10.0000 mL | Freq: Once | INTRAVENOUS | Status: AC | PRN
  Administered 2016-01-17: 10 mL via INTRAVENOUS

## 2016-01-19 ENCOUNTER — Telehealth: Payer: Self-pay | Admitting: *Deleted

## 2016-01-19 NOTE — Telephone Encounter (Signed)
-----   Message from Richard A Sater, MD sent at 01/18/2016  6:19 PM EDT ----- Please let her know that the MRI does not show any new MS lesions. 

## 2016-01-19 NOTE — Telephone Encounter (Signed)
-----   Message from Asa Lente, MD sent at 01/18/2016  6:19 PM EDT ----- Please let her know that the MRI does not show any new MS lesions.

## 2016-01-19 NOTE — Telephone Encounter (Signed)
Pt called back for MRI results  

## 2016-01-19 NOTE — Telephone Encounter (Signed)
I have spoken with Sabrina [name] this afternoon and per RAS, advised MRI does not show any new MS lesions.  She verbalized understanding of same/fim

## 2016-01-19 NOTE — Telephone Encounter (Signed)
LMTC./fim 

## 2016-01-20 DIAGNOSIS — G35 Multiple sclerosis: Secondary | ICD-10-CM | POA: Diagnosis not present

## 2016-02-03 DIAGNOSIS — G8929 Other chronic pain: Secondary | ICD-10-CM | POA: Diagnosis not present

## 2016-02-17 DIAGNOSIS — G35 Multiple sclerosis: Secondary | ICD-10-CM | POA: Diagnosis not present

## 2016-03-17 DIAGNOSIS — G35 Multiple sclerosis: Secondary | ICD-10-CM | POA: Diagnosis not present

## 2016-03-22 ENCOUNTER — Telehealth: Payer: Self-pay | Admitting: Neurology

## 2016-03-22 MED ORDER — AMPHETAMINE-DEXTROAMPHETAMINE 10 MG PO TABS: 10.0000 mg | ORAL_TABLET | Freq: Two times a day (BID) | ORAL | 0 refills | Status: DC

## 2016-03-22 NOTE — Telephone Encounter (Signed)
Rx. up front [facility]/fim 

## 2016-03-22 NOTE — Telephone Encounter (Signed)
Mom called to request refill of amphetamine-dextroamphetamine (ADDERALL) 10 MG tablet

## 2016-03-22 NOTE — Telephone Encounter (Signed)
Rx. awaiting RAS sig/fim 

## 2016-04-05 ENCOUNTER — Encounter: Payer: Self-pay | Admitting: Neurology

## 2016-04-05 ENCOUNTER — Ambulatory Visit (INDEPENDENT_AMBULATORY_CARE_PROVIDER_SITE_OTHER): Payer: Medicare Other | Admitting: Neurology

## 2016-04-05 VITALS — BP 128/88 | HR 78 | Ht 64.0 in | Wt 98.0 lb

## 2016-04-05 DIAGNOSIS — G35 Multiple sclerosis: Secondary | ICD-10-CM

## 2016-04-05 DIAGNOSIS — R5383 Other fatigue: Secondary | ICD-10-CM

## 2016-04-05 DIAGNOSIS — R208 Other disturbances of skin sensation: Secondary | ICD-10-CM | POA: Diagnosis not present

## 2016-04-05 DIAGNOSIS — M5432 Sciatica, left side: Secondary | ICD-10-CM

## 2016-04-05 DIAGNOSIS — R350 Frequency of micturition: Secondary | ICD-10-CM

## 2016-04-05 DIAGNOSIS — F411 Generalized anxiety disorder: Secondary | ICD-10-CM | POA: Diagnosis not present

## 2016-04-05 DIAGNOSIS — R269 Unspecified abnormalities of gait and mobility: Secondary | ICD-10-CM

## 2016-04-05 MED ORDER — OXYBUTYNIN CHLORIDE 5 MG PO TABS: 5.0000 mg | ORAL_TABLET | Freq: Two times a day (BID) | ORAL | 11 refills | Status: DC | PRN

## 2016-04-05 MED ORDER — AMPHETAMINE-DEXTROAMPHETAMINE 10 MG PO TABS: 10.0000 mg | ORAL_TABLET | Freq: Two times a day (BID) | ORAL | 0 refills | Status: DC

## 2016-04-05 NOTE — Progress Notes (Signed)
u  [facility]  PATIENT: Sabrina [name] DOB: 10-Nov-1980    _________________________________   HISTORICAL  CHIEF COMPLAINT:  Chief Complaint  Patient presents with  . Multiple Sclerosis    Sts. she continues to tolerate Tysabri well.  JCV ab last checked 07-14-15 and was negative at 0.12.   Sts. neuropathy in feet is worse.  Sts. if she takes full dose of Lamictal, she sleeps so hard that she doesn't wake up to go to the bathroom and wets the bed. She would like to know if she can get both the flu and pneumonia vaccines.  Also sts. she occasionally gets the sensation that her hands are shaking, althought there are no visible tremors. She would like her Vit. D level checked today/fim    HISTORY OF PRESENT ILLNESS:  Sabrina [name] is a 35 year old woman with multiple sclerosis and chronic pain.    MS:   She is getting her Tysabri infusions in [place] (has been on x 1.5 year).   She is tolerating infusions well though feels tired a few days with each one.   She feels her MS has done better than on Copaxone and she is tolerating it well.      She denies any MS exacerbation.     Gait/strength/sensation/pain:   She reports gait is worse with the pain and she is using walker for long distances (not in house).  No recent falls. The left leg is worse than the right with mild weakness and some spasticity.  She states her endurance is poor.     She has painful tingling in both feet.      Vision:      She notes no change in the right blurry vision.    She denies any diplopia.  Sciatica:    She has pain form left hip down leg to left foot. Piriformis injections helped a lot x 1 month and then pain returned.   She felt sick after a piriformis Botox injection but may have had the flu.   Botox was injected 125 U on the left and 75 U on the right.  Her muscles feel tired and achy.  Legs feel weak.   She felt bad on gabapentin, Lyrica and Tegretol.  Lamictal helps some but she felt higher  doses made her sleep too deeply.      Lumbar MRI was normal.      Bladder:  She notes stable urinary frequency and urgency.  On a higher dose of lamotrigine, she notes night time incontinence because she does not wake up.   She has 1 - 2 times nocturia.     Fatigue/sleep:   She has physical > mental fatigue.  Adderall is helping longer than the ritalin did.    She tolerates it well and does not need to take everyday.   No change in appetite or sleep.    She feels she sleeps well when pain is mild but sleeps poorly when in more pain.   She is noting less RLS/PLMS symptoms.    Mood/cognition:   She has depression and anxiety but feels she is doing better than last year.  She notes stress with family.   She has had some panic attacks but clonazepam helps She notes mild cognitive issues.   She has poor focus and attention She has rare difficulty with coming up with the right word.   She is on Pristiq and lamotrigine with benefit  MS History:  She had right optic  neuritis in December 2012 and had an MRI of the brain showing optic nerve inflammation and many white matter spots.    She saw Dr. Macon Large in [place].   She was started on Copaxone.  She likely had an exacerbation in 2013 when she had trouble walking x 2 months.   She received a few days of IV Solu-Medrol.  Last year, she also received a few days of IV Solu-Medrol but she is uncertain   She felt she did well for the first 2 years but switched to Gilenya because she was tired of the shots last year.   At first, she tolerated it well but then her depression got much worse and she had some stomach issues so she returned to Copaxone last month (20 mg daily).   MRI early 2016 showed multiple old MS plaques and 1  Enhancing focus in the right frontal lobe prompting change to Tysabi.     REVIEW OF SYSTEMS: Constitutional: No fevers, chills, sweats, or change in appetite.  Notes a lot of fatigue Eyes: No visual changes, double vision, eye pain Ear, nose and  throat: No hearing loss, ear pain, nasal congestion, sore throat Cardiovascular: No chest pain, palpitations Respiratory: No shortness of breath at rest or with exertion.   No wheezes GastrointestinaI: No nausea, vomiting, diarrhea, abdominal pain, fecal incontinence Genitourinary: as above. Musculoskeletal: No neck pain, back pain Integumentary: No rash, pruritus, skin lesions Neurological: as above Psychiatric: Notes some depression and anxiety Endocrine: No palpitations, diaphoresis, change in appetite, change in weigh or increased thirst Hematologic/Lymphatic: No anemia, purpura, petechiae. Allergic/Immunologic: No itchy/runny eyes, nasal congestion, recent allergic reactions, rashes  ALLERGIES: Allergies  Allergen Reactions  . Amoxicillin   . Penicillins   . Sulfa Antibiotics   . Codeine Nausea And Vomiting    HOME MEDICATIONS:  Current Outpatient Prescriptions:  .  amphetamine-dextroamphetamine (ADDERALL) 10 MG tablet, Take 1 tablet (10 mg total) by mouth 2 (two) times daily with a meal., Disp: 60 tablet, Rfl: 0 .  b complex vitamins tablet, every day., Disp: , Rfl:  .  baclofen (LIORESAL) 10 MG tablet, Take 1 tablet (10 mg total) by mouth 3 (three) times daily., Disp: 90 each, Rfl: 3 .  Cholecalciferol 5000 UNITS TABS, Take by mouth., Disp: , Rfl:  .  clonazePAM (KLONOPIN) 0.5 MG tablet, 1 tab po every morning, 1 tab po at noon, and 2 tabs po every evening, Disp: , Rfl:  .  DHA-EPA-VITAMIN E PO, Take by mouth., Disp: , Rfl:  .  HYDROcodone-acetaminophen (NORCO) 10-325 MG per tablet, Take 1 tablet by mouth 2 (two) times daily as needed., Disp: , Rfl: 0 .  lamoTRIgine (LAMICTAL) 100 MG tablet, Take 1 tablet (100 mg total) by mouth 2 (two) times daily., Disp: 60 tablet, Rfl: 11 .  methocarbamol (ROBAXIN) 500 MG tablet, Take 1 tablet (500 mg total) by mouth 3 (three) times daily., Disp: 90 tablet, Rfl: 5 .  Multiple Vitamin (MULTI VITAMIN MENS) tablet, Take by mouth., Disp: ,  Rfl:  .  natalizumab (TYSABRI) 300 MG/15ML injection, Inject 15 mLs (300 mg total) into the vein every 30 (thirty) days., Disp: 15 mL, Rfl: 12 .  Norgestimate-Ethinyl Estradiol Triphasic 0.18/0.215/0.25 MG-35 MCG tablet, Take by mouth., Disp: , Rfl:  .  OXYCODONE 10 MG 12 hr tablet, TK 1 T PO BID, Disp: , Rfl: 0 .  PRISTIQ 50 MG 24 hr tablet, , Disp: , Rfl:  .  cyclobenzaprine (FLEXERIL) 10 MG tablet, Take 10 mg  by mouth 2 (two) times daily. Reported on 07/14/2015, Disp: , Rfl: 3 .  Omega-3 Fatty Acids (FISH OIL) 1000 MG CAPS, Take by mouth., Disp: , Rfl:  .  oxybutynin (DITROPAN) 5 MG tablet, Take 1 tablet (5 mg total) by mouth 2 (two) times daily as needed for bladder spasms., Disp: 60 tablet, Rfl: 11  PAST MEDICAL HISTORY: Past Medical History:  Diagnosis Date  . Headache   . Multiple sclerosis (HCC) 10/28/2014  . Vision abnormalities     PAST SURGICAL HISTORY: No past surgical history on file.  FAMILY HISTORY: Family History  Problem Relation Age of Onset  . Healthy Mother   . Diabetes type II Father   . Prostate cancer Father     SOCIAL HISTORY:  Social History   Social History  . Marital status: Single    Spouse name: N/A  . Number of children: N/A  . Years of education: N/A   Occupational History  . Not on file.   Social History Main Topics  . Smoking status: Current Every Day Smoker    Packs/day: 0.50    Types: Cigarettes  . Smokeless tobacco: Not on file  . Alcohol use No  . Drug use: No  . Sexual activity: Not on file   Other Topics Concern  . Not on file   Social History Narrative  . No narrative on file     PHYSICAL EXAM  Vitals:   04/05/16 1530  BP: 128/88  Pulse: 78  Weight: 98 lb (44.5 kg)  Height: 5\' 4"  (1.626 m)    Body mass index is 16.82 kg/m.   General: The patient is well-developed and well-nourished and in no acute distress  Musculoskeletal:   She is tender over the left > right piriformis muscle. Paraspinal muscles are just  mildly tender.   Neurologic Exam  Mental status:  The patient is alert and oriented x 3 at the time of the examination. The patient has apparent normal recent and remote memory, with a mildly reduced attention span and concentration ability.     Cranial nerves: Extraocular movements are full.  Facial symmetry is present. There is good facial sensation to soft touch bilaterally.Facial strength is normal.  Trapezius and sternocleidomastoid strength is normal. No dysarthria is noted.   No obvious hearing deficits are noted.  Motor:  Muscle bulk is normal.   Tone is mildly increased in legs. Strength is  5/5.   Sensory: Sensory testing is intact to  touch and vibration sensation in her arms.   Mild altered leg sensation on the left  Coordination: Cerebellar testing reveals good finger-nose-finger bilaterally.  Gait and station: Station is normal.   Gait is mildly wide. Tandem gait is wide. Romberg is negative.   Reflexes: Deep tendon reflexes are symmetric and increased bilaterally with 2 beats clonue at the ankles.      DIAGNOSTIC DATA (LABS, IMAGING, TESTING) - I reviewed patient records, labs, notes, testing and imaging myself where available.       ASSESSMENT AND PLAN  Multiple sclerosis (HCC) - Plan: CBC with Differential/Platelet, Stratify JCV Antibody Test ([facility])  Left sided sciatica  Other fatigue  Gait disorder  Urinary frequency  Dysesthesia  Anxiety, generalized   1.   Continue Tysabri.   Check CBC and JCV antibody. 2.   continue  Adderall for fatigue/attentional deficit. 3.  She will think about another Botox for MS-related spasticity into the piriformis muscles.   If we do so, will use a lower  dose since she felt sick after the last one. 4.  rtc 4 months, sooner if problems   45 minute face-to-face evaluation with greater than one half of time counseling or coordinating care about her MS and related symptoms.  Richard A. Epimenio FootSater, MD, PhD 04/05/2016, 5:17  PM Certified in Neurology, Clinical Neurophysiology, Sleep Medicine, Pain Medicine and Neuroimaging  [facility] [address], [address] [place], [place] [id number] [phone]

## 2016-04-06 LAB — CBC WITH DIFFERENTIAL/PLATELET
BASOS: 0 %
Basophils Absolute: 0 10*3/uL (ref 0.0–0.2)
EOS (ABSOLUTE): 0.2 10*3/uL (ref 0.0–0.4)
EOS: 2 %
HEMATOCRIT: 40.2 % (ref 34.0–46.6)
HEMOGLOBIN: 13.6 g/dL (ref 11.1–15.9)
IMMATURE GRANS (ABS): 0 10*3/uL (ref 0.0–0.1)
IMMATURE GRANULOCYTES: 0 %
LYMPHS: 48 %
Lymphocytes Absolute: 3.3 10*3/uL — ABNORMAL HIGH (ref 0.7–3.1)
MCH: 30.8 pg (ref 26.6–33.0)
MCHC: 33.8 g/dL (ref 31.5–35.7)
MCV: 91 fL (ref 79–97)
Monocytes Absolute: 0.5 10*3/uL (ref 0.1–0.9)
Monocytes: 8 %
NEUTROS PCT: 42 %
Neutrophils Absolute: 2.9 10*3/uL (ref 1.4–7.0)
Platelets: 278 10*3/uL (ref 150–379)
RBC: 4.41 x10E6/uL (ref 3.77–5.28)
RDW: 14.5 % (ref 12.3–15.4)
WBC: 6.9 10*3/uL (ref 3.4–10.8)

## 2016-04-12 ENCOUNTER — Encounter: Payer: Self-pay | Admitting: *Deleted

## 2016-04-14 DIAGNOSIS — G35 Multiple sclerosis: Secondary | ICD-10-CM | POA: Diagnosis not present

## 2016-05-03 DIAGNOSIS — R5383 Other fatigue: Secondary | ICD-10-CM | POA: Diagnosis not present

## 2016-05-03 DIAGNOSIS — G35 Multiple sclerosis: Secondary | ICD-10-CM | POA: Diagnosis not present

## 2016-05-03 DIAGNOSIS — Z79899 Other long term (current) drug therapy: Secondary | ICD-10-CM | POA: Diagnosis not present

## 2016-05-03 DIAGNOSIS — Z23 Encounter for immunization: Secondary | ICD-10-CM | POA: Diagnosis not present

## 2016-05-17 ENCOUNTER — Telehealth: Payer: Self-pay | Admitting: Neurology

## 2016-05-17 DIAGNOSIS — G35 Multiple sclerosis: Secondary | ICD-10-CM | POA: Diagnosis not present

## 2016-05-17 MED ORDER — NATALIZUMAB 300 MG/15ML IV CONC: 300.0000 mg | INTRAVENOUS | 12 refills | Status: DC

## 2016-05-17 NOTE — Addendum Note (Signed)
Addended by: Candis SchatzMISENHEIMER, Denzil Bristol I on: 05/17/2016 11:18 AM   Modules accepted: Orders

## 2016-05-17 NOTE — Telephone Encounter (Signed)
Rx. awaiting [facility] sig, then will fax to [facility].  I have spoken with [facility] and explained will fax rx. this am/fim

## 2016-05-17 NOTE — Telephone Encounter (Signed)
Ty order faxed to [facility]/fim

## 2016-05-17 NOTE — Telephone Encounter (Signed)
Chriss Driver with [facility] in [place] is calling to get an order faxed for the patient to have a Tysabri 300mg  infusion. Previous order has expired.The patient is there now. Please fax order to [phone] ATTN: Chriss Driver.

## 2016-06-09 ENCOUNTER — Encounter: Payer: Self-pay | Admitting: *Deleted

## 2016-06-15 ENCOUNTER — Ambulatory Visit (INDEPENDENT_AMBULATORY_CARE_PROVIDER_SITE_OTHER): Payer: Medicare Other | Admitting: Neurology

## 2016-06-15 ENCOUNTER — Encounter: Payer: Self-pay | Admitting: Neurology

## 2016-06-15 VITALS — BP 122/80 | HR 82 | Resp 14 | Ht 64.0 in | Wt 100.0 lb

## 2016-06-15 DIAGNOSIS — M5432 Sciatica, left side: Secondary | ICD-10-CM | POA: Diagnosis not present

## 2016-06-15 DIAGNOSIS — F32A Depression, unspecified: Secondary | ICD-10-CM

## 2016-06-15 DIAGNOSIS — M62838 Other muscle spasm: Secondary | ICD-10-CM

## 2016-06-15 DIAGNOSIS — R5383 Other fatigue: Secondary | ICD-10-CM | POA: Diagnosis not present

## 2016-06-15 DIAGNOSIS — G35 Multiple sclerosis: Secondary | ICD-10-CM

## 2016-06-15 DIAGNOSIS — R269 Unspecified abnormalities of gait and mobility: Secondary | ICD-10-CM

## 2016-06-15 DIAGNOSIS — F411 Generalized anxiety disorder: Secondary | ICD-10-CM

## 2016-06-15 DIAGNOSIS — R208 Other disturbances of skin sensation: Secondary | ICD-10-CM | POA: Diagnosis not present

## 2016-06-15 DIAGNOSIS — F329 Major depressive disorder, single episode, unspecified: Secondary | ICD-10-CM

## 2016-06-15 MED ORDER — HYDROCODONE-ACETAMINOPHEN 10-325 MG PO TABS: 1.0000 | ORAL_TABLET | Freq: Two times a day (BID) | ORAL | 0 refills | Status: DC | PRN

## 2016-06-15 MED ORDER — AMPHETAMINE-DEXTROAMPHETAMINE 10 MG PO TABS: 10.0000 mg | ORAL_TABLET | Freq: Two times a day (BID) | ORAL | 0 refills | Status: DC

## 2016-06-15 MED ORDER — CLONAZEPAM 0.5 MG PO TABS: 0.5000 mg | ORAL_TABLET | Freq: Three times a day (TID) | ORAL | 3 refills | Status: DC | PRN

## 2016-06-15 NOTE — Progress Notes (Addendum)
u  [facility]  PATIENT: Sabrina [name] DOB: Jun 16, 1981    _________________________________   HISTORICAL  CHIEF COMPLAINT:  Chief Complaint  Patient presents with  . Multiple Sclerosis    Sts. she continues to tolerate Tysabri well.  Sts. her pcp will no longer be rx'ing opiates after 08-12-15, so she would like to know if [facility] will take over rx's for Hydrocodone, Clonazepam and Pristiq/fim    HISTORY OF PRESENT ILLNESS:  Sabrina [name] is a 35 year old woman with multiple sclerosis and chronic pain.     She recently changed PCP and asks if I can take over refills for some of her prescriptions  MS:   She is getting her Tysabri infusions in [place] since early 2016.   She is tolerating the infusions well.   She feels her MS has done better than on Copaxone and she is tolerating it well.      She denies any MS exacerbation.     Gait/strength/sensation/pain:   She reports gait is worse with the pain and she is using walker for long distances (not in house).  No recent falls. The left leg is worse than the right with mild weakness and some spasticity.  She states her endurance is poor.     She has painful tingling in both feet.      Vision:      She notes no change in the right blurry vision.    She denies any diplopia.  Sciatica/pain:    She has pain from the left hip/buttock down leg to left foot. Piriformis injections helped a lot x 1 month and then pain returned.   She felt sick after a piriformis Botox injection but may have had the flu.   Botox was injected 125 U on the left and 75 U on the right and she only did this once but the benefit was much longer than the TPI.   She felt bad on gabapentin, Lyrica and Tegretol.  Lamictal helps some but she felt higher doses made her sleep too deeply so she is back to just 100 mg nightly.      Lumbar MRI was normal.      Bladder:  She notes stable urinary frequency and urgency and has nocturnal incontinence once a month.  She has  1 - 2 times nocturia.     Fatigue/sleep:   She has physical > mental fatigue, help a lot by Adderall.   She tolerates it well and does not need to take everyday.  She has not loss any more weight and notes change in appetite or sleep.    She feels she sleeps much better now than last year and RLS/PLMS is better.    Mood/cognition:   She has depression and anxiety but is doing better than last year.  She notes stress with family and the holidays.   She has had some panic attacks and clonazepam helps the most.   She notes mild cognitive issues with poor focus and attention and reduced verbal fluency.    She is on Pristiq and lamotrigine with benefit  MS History:  She had right optic neuritis in December 2012 and had an MRI of the brain showing optic nerve inflammation and many white matter spots.    She saw Dr. Macon [place]anwu in [place].   She was started on Copaxone.  She likely had an exacerbation in 2013 when she had trouble walking x 2 months.   She received a few days of IV Solu-Medrol.  Last year, she also received a few days of IV Solu-Medrol but she is uncertain   She felt she did well for the first 2 years but switched to Gilenya because she was tired of the shots last year.   At first, she tolerated it well but then her depression got much worse and she had some stomach issues so she returned to Copaxone last month (20 mg daily).   MRI early 2016 showed multiple old MS plaques and 1  Enhancing focus in the right frontal lobe prompting change to Tysabi.     REVIEW OF SYSTEMS: Constitutional: No fevers, chills, sweats, or change in appetite.  Notes a lot of fatigue Eyes: No visual changes, double vision, eye pain Ear, nose and throat: No hearing loss, ear pain, nasal congestion, sore throat Cardiovascular: No chest pain, palpitations Respiratory: No shortness of breath at rest or with exertion.   No wheezes GastrointestinaI: No nausea, vomiting, diarrhea, abdominal pain, fecal  incontinence Genitourinary: as above. Musculoskeletal: No neck pain, back pain Integumentary: No rash, pruritus, skin lesions Neurological: as above Psychiatric: Notes some depression and anxiety Endocrine: No palpitations, diaphoresis, change in appetite, change in weigh or increased thirst Hematologic/Lymphatic: No anemia, purpura, petechiae. Allergic/Immunologic: No itchy/runny eyes, nasal congestion, recent allergic reactions, rashes  ALLERGIES: Allergies  Allergen Reactions  . Amoxicillin   . Penicillins   . Sulfa Antibiotics   . Codeine Nausea And Vomiting    HOME MEDICATIONS:  Current Outpatient Prescriptions:  .  amphetamine-dextroamphetamine (ADDERALL) 10 MG tablet, Take 1 tablet (10 mg total) by mouth 2 (two) times daily with a meal., Disp: 60 tablet, Rfl: 0 .  b complex vitamins tablet, every day., Disp: , Rfl:  .  baclofen (LIORESAL) 10 MG tablet, Take 1 tablet (10 mg total) by mouth 3 (three) times daily., Disp: 90 each, Rfl: 3 .  Cholecalciferol 5000 UNITS TABS, Take by mouth., Disp: , Rfl:  .  clonazePAM (KLONOPIN) 0.5 MG tablet, Take 1 tablet (0.5 mg total) by mouth 3 (three) times daily as needed for anxiety., Disp: 90 tablet, Rfl: 3 .  cyclobenzaprine (FLEXERIL) 10 MG tablet, Take 10 mg by mouth 2 (two) times daily. Reported on 07/14/2015, Disp: , Rfl: 3 .  DHA-EPA-VITAMIN E PO, Take by mouth., Disp: , Rfl:  .  HYDROcodone-acetaminophen (NORCO) 10-325 MG tablet, Take 1 tablet by mouth 2 (two) times daily as needed., Disp: 60 tablet, Rfl: 0 .  lamoTRIgine (LAMICTAL) 100 MG tablet, Take 1 tablet (100 mg total) by mouth 2 (two) times daily., Disp: 60 tablet, Rfl: 11 .  methocarbamol (ROBAXIN) 500 MG tablet, Take 1 tablet (500 mg total) by mouth 3 (three) times daily., Disp: 90 tablet, Rfl: 5 .  Multiple Vitamin (MULTI VITAMIN MENS) tablet, Take by mouth., Disp: , Rfl:  .  natalizumab (TYSABRI) 300 MG/15ML injection, Inject 15 mLs (300 mg total) into the vein every 30  (thirty) days., Disp: 15 mL, Rfl: 12 .  Norgestimate-Ethinyl Estradiol Triphasic 0.18/0.215/0.25 MG-35 MCG tablet, Take by mouth., Disp: , Rfl:  .  Omega-3 Fatty Acids (FISH OIL) 1000 MG CAPS, Take by mouth., Disp: , Rfl:  .  oxybutynin (DITROPAN) 5 MG tablet, Take 1 tablet (5 mg total) by mouth 2 (two) times daily as needed for bladder spasms., Disp: 60 tablet, Rfl: 11 .  PRISTIQ 50 MG 24 hr tablet, , Disp: , Rfl:   PAST MEDICAL HISTORY: Past Medical History:  Diagnosis Date  . Headache   . Multiple sclerosis (HCC) 10/28/2014  .  Vision abnormalities     PAST SURGICAL HISTORY: No past surgical history on file.  FAMILY HISTORY: Family History  Problem Relation Age of Onset  . Healthy Mother   . Diabetes type II Father   . Prostate cancer Father     SOCIAL HISTORY:  Social History   Social History  . Marital status: Single    Spouse name: N/A  . Number of children: N/A  . Years of education: N/A   Occupational History  . Not on file.   Social History Main Topics  . Smoking status: Current Every Day Smoker    Packs/day: 0.50    Types: Cigarettes  . Smokeless tobacco: Not on file  . Alcohol use No  . Drug use: No  . Sexual activity: Not on file   Other Topics Concern  . Not on file   Social History Narrative  . No narrative on file     PHYSICAL EXAM  Vitals:   06/15/16 1115  BP: 122/80  Pulse: 82  Resp: 14  Weight: 100 lb (45.4 kg)  Height: 5\' 4"  (1.626 m)    Body mass index is 17.16 kg/m.   General: The patient is well-developed and well-nourished and in no acute distress  Musculoskeletal:   She is tender over the left > right piriformis muscle. Paraspinal muscles are just mildly tender.  Neurologic Exam  Mental status:  The patient is alert and oriented x 3 at the time of the examination. The patient has apparent normal recent and remote memory, with a mildly reduced attention span and concentration ability.     Cranial nerves: Extraocular  movements are full.  Facial symmetry is present. There is good facial sensation to soft touch bilaterally.Facial strength is normal.  Trapezius and sternocleidomastoid strength is normal. No dysarthria is noted.   No obvious hearing deficits are noted.  Motor:  Muscle bulk is normal.   Tone is mildly increased in legs. Strength is  5/5.   Sensory: Sensory testing is intact to  touch and vibration sensation in her arms.   Mild altered leg sensation on the left  Coordination: Cerebellar testing reveals good finger-nose-finger bilaterally.  Gait and station: Station is normal.   Gait is mildly wide. Tandem gait is wide. Romberg is negative.   Reflexes: Deep tendon reflexes are symmetric and increased bilaterally with 2 beats clonue at the ankles.      DIAGNOSTIC DATA (LABS, IMAGING, TESTING) - I reviewed patient records, labs, notes, testing and imaging myself where available.       ASSESSMENT AND PLAN  Multiple sclerosis (HCC)  Muscle spasticity  Left sided sciatica  Other fatigue  Gait disorder  Depression, unspecified depression type  Dysesthesia  Anxiety, generalized   1.   Continue Tysabri.   Check CBC and JCV antibody at next visit (was negative 04/06/16). 2.   Continue  Adderall for fatigue/attentional deficit.  I can take over her hydrocodone prescription and her clonazepam. Ideally, she might want to find a pain management closer to home 3.   We will get her authorized for Botox for MS-related spasticity into the piriformis muscles, bilaterally with 100 U as that worked better than the TPI's 4.   Bilateral piriformis injection with 80 mg Depo-Medrol in [place] 5.   rtc 4 months, sooner if problems and sooner for Botox   Harbor Vanover A. Epimenio Foot, MD, PhD 06/15/2016, 12:02 PM Certified in Neurology, Clinical Neurophysiology, Sleep Medicine, Pain Medicine and Neuroimaging  [facility] [address] [place], [place] [address] [phone]

## 2016-06-16 DIAGNOSIS — Z882 Allergy status to sulfonamides status: Secondary | ICD-10-CM | POA: Diagnosis not present

## 2016-06-16 DIAGNOSIS — Z880 Allergy status to penicillin: Secondary | ICD-10-CM | POA: Diagnosis not present

## 2016-06-16 DIAGNOSIS — Z881 Allergy status to other antibiotic agents status: Secondary | ICD-10-CM | POA: Diagnosis not present

## 2016-06-16 DIAGNOSIS — G35 Multiple sclerosis: Secondary | ICD-10-CM | POA: Diagnosis not present

## 2016-06-16 DIAGNOSIS — Z885 Allergy status to narcotic agent status: Secondary | ICD-10-CM | POA: Diagnosis not present

## 2016-07-19 DIAGNOSIS — G35 Multiple sclerosis: Secondary | ICD-10-CM | POA: Diagnosis not present

## 2016-08-16 DIAGNOSIS — G35 Multiple sclerosis: Secondary | ICD-10-CM | POA: Diagnosis not present

## 2016-08-17 ENCOUNTER — Telehealth: Payer: Self-pay | Admitting: Neurology

## 2016-08-17 MED ORDER — AMPHETAMINE-DEXTROAMPHETAMINE 10 MG PO TABS: 10.0000 mg | ORAL_TABLET | Freq: Two times a day (BID) | ORAL | 0 refills | Status: DC

## 2016-08-17 MED ORDER — HYDROCODONE-ACETAMINOPHEN 10-325 MG PO TABS: 1.0000 | ORAL_TABLET | Freq: Two times a day (BID) | ORAL | 0 refills | Status: DC | PRN

## 2016-08-17 NOTE — Telephone Encounter (Signed)
Patient called requesting refill for amphetamine-dextroamphetamine (ADDERALL) 10 MG tablet and HYDROcodone-acetaminophen (NORCO) 10-325 MG tablet

## 2016-08-17 NOTE — Telephone Encounter (Signed)
Rx's up front [facility]/fim 

## 2016-08-17 NOTE — Addendum Note (Signed)
Addended by: Candis Schatz I on: 08/17/2016 02:29 PM   Modules accepted: Orders

## 2016-08-17 NOTE — Telephone Encounter (Signed)
Rx's awaiting RAS sig/fim 

## 2016-09-15 DIAGNOSIS — G35 Multiple sclerosis: Secondary | ICD-10-CM | POA: Diagnosis not present

## 2016-09-16 ENCOUNTER — Telehealth: Payer: Self-pay | Admitting: Neurology

## 2016-09-16 NOTE — Telephone Encounter (Signed)
Pt request refill for amphetamine-dextroamphetamine (ADDERALL) 10 MG tablet and HYDROcodone-acetaminophen (NORCO) 10-325 MG tablet  Pt is aware clinic closes at noon today.

## 2016-09-19 MED ORDER — AMPHETAMINE-DEXTROAMPHETAMINE 10 MG PO TABS: 10.0000 mg | ORAL_TABLET | Freq: Two times a day (BID) | ORAL | 0 refills | Status: DC

## 2016-09-19 MED ORDER — HYDROCODONE-ACETAMINOPHEN 10-325 MG PO TABS: 1.0000 | ORAL_TABLET | Freq: Two times a day (BID) | ORAL | 0 refills | Status: DC | PRN

## 2016-09-19 NOTE — Telephone Encounter (Signed)
Rx's up front [facility]/fim 

## 2016-09-19 NOTE — Telephone Encounter (Signed)
Rx's awaiting RAS sig/fim 

## 2016-10-14 DIAGNOSIS — G35 Multiple sclerosis: Secondary | ICD-10-CM | POA: Diagnosis not present

## 2016-10-17 ENCOUNTER — Ambulatory Visit (INDEPENDENT_AMBULATORY_CARE_PROVIDER_SITE_OTHER): Payer: Medicare Other | Admitting: Neurology

## 2016-10-17 ENCOUNTER — Encounter: Payer: Self-pay | Admitting: Neurology

## 2016-10-17 DIAGNOSIS — F329 Major depressive disorder, single episode, unspecified: Secondary | ICD-10-CM

## 2016-10-17 DIAGNOSIS — G35 Multiple sclerosis: Secondary | ICD-10-CM

## 2016-10-17 DIAGNOSIS — M5432 Sciatica, left side: Secondary | ICD-10-CM

## 2016-10-17 DIAGNOSIS — R202 Paresthesia of skin: Secondary | ICD-10-CM

## 2016-10-17 DIAGNOSIS — F32A Depression, unspecified: Secondary | ICD-10-CM

## 2016-10-17 DIAGNOSIS — R634 Abnormal weight loss: Secondary | ICD-10-CM

## 2016-10-17 DIAGNOSIS — R5383 Other fatigue: Secondary | ICD-10-CM | POA: Diagnosis not present

## 2016-10-17 DIAGNOSIS — R269 Unspecified abnormalities of gait and mobility: Secondary | ICD-10-CM | POA: Diagnosis not present

## 2016-10-17 MED ORDER — CLONAZEPAM 0.5 MG PO TABS: 0.5000 mg | ORAL_TABLET | Freq: Three times a day (TID) | ORAL | 5 refills | Status: DC | PRN

## 2016-10-17 MED ORDER — AMPHETAMINE-DEXTROAMPHETAMINE 10 MG PO TABS: 10.0000 mg | ORAL_TABLET | Freq: Two times a day (BID) | ORAL | 0 refills | Status: DC

## 2016-10-17 MED ORDER — HYDROCODONE-ACETAMINOPHEN 10-325 MG PO TABS: 1.0000 | ORAL_TABLET | Freq: Two times a day (BID) | ORAL | 0 refills | Status: DC | PRN

## 2016-10-17 MED ORDER — OXYBUTYNIN CHLORIDE 5 MG PO TABS: 5.0000 mg | ORAL_TABLET | Freq: Two times a day (BID) | ORAL | 11 refills | Status: DC | PRN

## 2016-10-17 MED ORDER — MEGESTROL ACETATE 40 MG PO TABS: 40.0000 mg | ORAL_TABLET | Freq: Every day | ORAL | 11 refills | Status: DC

## 2016-10-17 NOTE — Progress Notes (Signed)
u  [facility]  PATIENT: Sabrina [name] DOB: 08/26/1980    _________________________________   HISTORICAL  CHIEF COMPLAINT:  Chief Complaint  Patient presents with  . Multiple Sclerosis    Sts. she continues to tolerate Tysabri well.  JCV ab last checked 04-06-16 and was negative at 0.14.  Has lost 9 lbs since last ov--now is 91.5lbs.  Mother is concerned that she does not eat enough/fim    HISTORY OF PRESENT ILLNESS:  Sabrina [name] is a 36 year old woman with multiple sclerosis and chronic pain.      She has continued to lose [name], now weighing 91 pounds.  MS:   She continues to do monthly Tysabri (in [place]) since 2016.   She is tolerating the infusions well.   She feels her MS has done better on Tysabrithan on Copaxone and she is tolerating it well.      She denies any MS exacerbation.     Gait/strength/sensation/pain:   She reports gait is worse with the pain and she is using walker for long distances (not in house).  No recent falls. The left leg is worse than the right with mild weakness and some spasticity.  She states her endurance is poor.     She has painful tingling in both feet.      Vision:      She notes no change in the right blurry vision.    She denies any diplopia.  Sciatica/pain:  She continues to experience a lot of hip/buttock/leg pain.   She also sometimes gets a tight sensation in her chest and upper abdomen.   Piriformis injections helped a lot x 1 month and then pain returned.  She tries to do stretches.   She felt sick after a piriformis Botox injection but may have had the flu.   Botox was injected 125 U on the left and 75 U on the right and she only did this once but the benefit was much longer than the TPI.   Other med's including gabapentin, Lyrica and Tegretol are not well tolerated.  Lamictal helps the pain some.      Lumbar MRI was normal.      Bladder:  She notes stable urinary frequency and urgency and has nocturnal incontinence  once a month.  She has 1 - 2 times nocturia and sometimes has nocturnal incontinence.   In the past she was on oxybutynin with benefit.  She denies hesitrancy.      Fatigue/sleep:   She has fatigue is physical more than mental. Adderall has helped her fatigue quite a bit. .  Even though she has [name] loss, she feels appetite is not suppressed.    Sometimes, she will take only one Adderall pill and occasionally does not take any..       She feels she sleeps much better now than last year and RLS/PLMS is better.    Mood/cognition:   She has depression and anxiety but is doing better than last year.  She notes stress with family and the holidays.   She has had some panic attacks and clonazepam helps the most.   She notes mild cognitive issues with poor focus and attention and reduced verbal fluency.    She is on Pristiq and lamotrigine with benefit  MS History:  She had right optic neuritis in December 2012 and had an MRI of the brain showing optic nerve inflammation and many white matter spots.    She saw Dr. Macon Large in [employer].  She was started on Copaxone.  She likely had an exacerbation in 2013 when she had trouble walking x 2 months.   She received a few days of IV Solu-Medrol.  Last year, she also received a few days of IV Solu-Medrol but she is uncertain   She felt she did well for the first 2 years but switched to Gilenya because she was tired of the shots last year.   At first, she tolerated it well but then her depression got much worse and she had some stomach issues so she returned to Copaxone last month (20 mg daily).   MRI early 2016 showed multiple old MS plaques and 1  Enhancing focus in the right frontal lobe prompting change to Tysabi.     REVIEW OF SYSTEMS: Constitutional: No fevers, chills, sweats, or change in appetite.  Notes a lot of fatigue Eyes: No visual changes, double vision, eye pain Ear, nose and throat: No hearing loss, ear pain, nasal congestion, sore throat Cardiovascular:  No chest pain, palpitations Respiratory: No shortness of breath at rest or with exertion.   No wheezes GastrointestinaI: No nausea, vomiting, diarrhea, abdominal pain, fecal incontinence Genitourinary: as above. Musculoskeletal: No neck pain, back pain Integumentary: No rash, pruritus, skin lesions Neurological: as above Psychiatric: Notes some depression and anxiety Endocrine: No palpitations, diaphoresis, change in appetite, change in weigh or increased thirst Hematologic/Lymphatic: No anemia, purpura, petechiae. Allergic/Immunologic: No itchy/runny eyes, nasal congestion, recent allergic reactions, rashes  ALLERGIES: Allergies  Allergen Reactions  . Amoxicillin   . Penicillins   . Sulfa Antibiotics   . Codeine Nausea And Vomiting    HOME MEDICATIONS:  Current Outpatient Prescriptions:  .  amphetamine-dextroamphetamine (ADDERALL) 10 MG tablet, Take 1 tablet (10 mg total) by mouth 2 (two) times daily with a meal., Disp: 60 tablet, Rfl: 0 .  b complex vitamins tablet, every day., Disp: , Rfl:  .  baclofen (LIORESAL) 10 MG tablet, Take 1 tablet (10 mg total) by mouth 3 (three) times daily., Disp: 90 each, Rfl: 3 .  Cholecalciferol 5000 UNITS TABS, Take by mouth., Disp: , Rfl:  .  clonazePAM (KLONOPIN) 0.5 MG tablet, Take 1 tablet (0.5 mg total) by mouth 3 (three) times daily as needed for anxiety., Disp: 90 tablet, Rfl: 5 .  DHA-EPA-VITAMIN E PO, Take by mouth., Disp: , Rfl:  .  HYDROcodone-acetaminophen (NORCO) 10-325 MG tablet, Take 1 tablet by mouth 2 (two) times daily as needed., Disp: 60 tablet, Rfl: 0 .  lamoTRIgine (LAMICTAL) 100 MG tablet, Take 1 tablet (100 mg total) by mouth 2 (two) times daily., Disp: 60 tablet, Rfl: 11 .  methocarbamol (ROBAXIN) 500 MG tablet, Take 1 tablet (500 mg total) by mouth 3 (three) times daily., Disp: 90 tablet, Rfl: 5 .  Multiple Vitamin (MULTI VITAMIN MENS) tablet, Take by mouth., Disp: , Rfl:  .  natalizumab (TYSABRI) 300 MG/15ML  injection, Inject 15 mLs (300 mg total) into the vein every 30 (thirty) days., Disp: 15 mL, Rfl: 12 .  Norgestimate-Ethinyl Estradiol Triphasic 0.18/0.215/0.25 MG-35 MCG tablet, Take by mouth., Disp: , Rfl:  .  Omega-3 Fatty Acids (FISH OIL) 1000 MG CAPS, Take by mouth., Disp: , Rfl:  .  oxybutynin (DITROPAN) 5 MG tablet, Take 1 tablet (5 mg total) by mouth 2 (two) times daily as needed for bladder spasms., Disp: 60 tablet, Rfl: 11 .  PRISTIQ 50 MG 24 hr tablet, , Disp: , Rfl:  .  cyclobenzaprine (FLEXERIL) 10 MG tablet, Take 10 mg by  mouth 2 (two) times daily. Reported on 07/14/2015, Disp: , Rfl: 3 .  megestrol (MEGACE) 40 MG tablet, Take 1 tablet (40 mg total) by mouth daily., Disp: 30 tablet, Rfl: 11  PAST MEDICAL HISTORY: Past Medical History:  Diagnosis Date  . Headache   . Multiple sclerosis (HCC) 10/28/2014  . Vision abnormalities     PAST SURGICAL HISTORY: No past surgical history on file.  FAMILY HISTORY: Family History  Problem Relation Age of Onset  . Healthy Mother   . Diabetes type II Father   . Prostate cancer Father     SOCIAL HISTORY:  Social History   Social History  . Marital status: Single    Spouse name: N/A  . Number of children: N/A  . Years of education: N/A   Occupational History  . Not on file.   Social History Main Topics  . Smoking status: Current Every Day Smoker    Packs/day: 0.50    Types: Cigarettes  . Smokeless tobacco: Never Used  . Alcohol use No  . Drug use: No  . Sexual activity: Not on file   Other Topics Concern  . Not on file   Social History Narrative  . No narrative on file     PHYSICAL EXAM  There were no vitals filed for this visit.  There is no height or [name] on file to calculate BMI.   General: The patient is well-developed and well-nourished and in no acute distress  Musculoskeletal:   She is tender over the left > right piriformis muscle. No trochanteric bursa tenderness. Paraspinal muscles are just mildly  tender.  Neurologic Exam  Mental status:  The patient is alert and oriented x 3 at the time of the examination. The patient has apparent normal recent and remote memory, with a mildly reduced attention span and concentration ability.     Cranial nerves: Extraocular movements are full.  Facial symmetry is present. There is good facial sensation to soft touch bilaterally.Facial strength is normal.  Trapezius and sternocleidomastoid strength is normal. No dysarthria is noted.   No obvious hearing deficits are noted.  Motor:  Muscle bulk is normal.   Tone is mildly increased in legs. Strength is  5/5.   Sensory: She has normal sensation in her arms. She feels less vibration sensation in the legs and the arms. There is slight reduce sensation to temperature  Coordination: Cerebellar testing reveals good finger-nose-finger bilaterally.  Gait and station: Station is normal.   Gait is mildly wide. Tandem gait is wide. Romberg is negative.   Reflexes: Deep tendon reflexes are symmetric and increased bilaterally with 2 beats clonue at the ankles.      DIAGNOSTIC DATA (LABS, IMAGING, TESTING) - I reviewed patient records, labs, notes, testing and imaging myself where available.       ASSESSMENT AND PLAN  Multiple sclerosis (HCC) - Plan: Stratify JCV Antibody Test ([facility]), CBC with Differential/Platelet  Left sided sciatica  Abnormal [name] loss  Depression, unspecified depression type  Gait disorder  Leg paresthesia  Other fatigue   1.   Continue Tysabri.   Check CBC and JCV antibody   2.   Continue  Adderall for fatigue/attentional deficit.  We had a discussion that the only be worsening her [name] loss he has not noted a significant change in appetite. We also discussed a trial of Megace for her [name] loss.  3.   I will try to get her authorized for Botox for MS-related spasticity into the piriformis  muscles.  In the past, the spasticity was greatly improved by Botox she  stopped as she felt a fever week after her injection. She would like to get back on Botox.  4.   Bilateral piriformis trigger point injection with 80 mg Depo-Medrol in [place] 5.   rtc 4 months, sooner if problems and sooner for Botox  45 minute face-to-face evaluation with greater than one of the time counseling or coordinating care about her MS, [name] loss and other symptoms.  Richard A. Epimenio Foot, MD, PhD 10/17/2016, 3:32 PM Certified in Neurology, Clinical Neurophysiology, Sleep Medicine, Pain Medicine and Neuroimaging  [facility] [address] [place], [place] [address] [phone]

## 2016-10-18 LAB — CBC WITH DIFFERENTIAL/PLATELET
BASOS: 1 %
Basophils Absolute: 0 10*3/uL (ref 0.0–0.2)
EOS (ABSOLUTE): 0.2 10*3/uL (ref 0.0–0.4)
Eos: 3 %
HEMATOCRIT: 40.7 % (ref 34.0–46.6)
Hemoglobin: 13 g/dL (ref 11.1–15.9)
IMMATURE GRANULOCYTES: 0 %
Immature Grans (Abs): 0 10*3/uL (ref 0.0–0.1)
LYMPHS ABS: 2.9 10*3/uL (ref 0.7–3.1)
Lymphs: 38 %
MCH: 30.3 pg (ref 26.6–33.0)
MCHC: 31.9 g/dL (ref 31.5–35.7)
MCV: 95 fL (ref 79–97)
MONOCYTES: 10 %
MONOS ABS: 0.8 10*3/uL (ref 0.1–0.9)
NEUTROS PCT: 48 %
Neutrophils Absolute: 3.7 10*3/uL (ref 1.4–7.0)
Platelets: 262 10*3/uL (ref 150–379)
RBC: 4.29 x10E6/uL (ref 3.77–5.28)
RDW: 14 % (ref 12.3–15.4)
WBC: 7.7 10*3/uL (ref 3.4–10.8)

## 2016-10-19 ENCOUNTER — Telehealth: Payer: Self-pay | Admitting: Neurology

## 2016-10-19 MED ORDER — PRISTIQ 50 MG PO TB24: 50.0000 mg | ORAL_TABLET | Freq: Every day | ORAL | 3 refills | Status: DC

## 2016-10-19 NOTE — Addendum Note (Signed)
Addended by: Verdell Dykman I on: 10/19/2016 04:22 PM   Modules accepted: Orders  

## 2016-10-19 NOTE — Telephone Encounter (Signed)
Pt called for a refill of PRISTIQ 50 MG 24 hr tablet Please send to   [facility] [id number] - [place], [place] - [address] AT [facility] OF [address]

## 2016-10-19 NOTE — Telephone Encounter (Signed)
LMOM.  I spoke with pt. earlier this afternoon.  RAS has never rx'd Pristiq for her. She sts. she is in between pcp's right now--in the process of establishing with Dr. Gerda Diss, and wanted to see if RAS would continue Pristiq for her until she sees Luking.  She has been on Pristiq, is established on it, sts. she tolerates it well.  Per RAS, ok to rx. until she gets a new pcp.  Rx. escribed to [facility] in [place] as requested.  She does not need to return this call unless she has questions/fim

## 2016-10-20 MED ORDER — DESVENLAFAXINE SUCCINATE ER 50 MG PO TB24: 50.0000 mg | ORAL_TABLET | Freq: Every day | ORAL | 3 refills | Status: DC

## 2016-10-20 NOTE — Addendum Note (Signed)
Addended by: Candis Schatz I on: 10/20/2016 03:12 PM   Modules accepted: Orders

## 2016-10-20 NOTE — Telephone Encounter (Signed)
Generic Pristiq escribed to [facility] as requested/fim

## 2016-10-20 NOTE — Telephone Encounter (Signed)
Pt's husband called said pharmacy does not have brand name. He is requesting generic RX to be sent in. He said patient has been taking generic. He apologized for the confusion and thanked the staff for helping.

## 2016-10-25 ENCOUNTER — Encounter: Payer: Self-pay | Admitting: *Deleted

## 2016-11-15 DIAGNOSIS — G35 Multiple sclerosis: Secondary | ICD-10-CM | POA: Diagnosis not present

## 2016-11-16 ENCOUNTER — Telehealth: Payer: Self-pay | Admitting: Neurology

## 2016-11-16 MED ORDER — AMPHETAMINE-DEXTROAMPHETAMINE 10 MG PO TABS: 10.0000 mg | ORAL_TABLET | Freq: Two times a day (BID) | ORAL | 0 refills | Status: DC

## 2016-11-16 MED ORDER — HYDROCODONE-ACETAMINOPHEN 10-325 MG PO TABS
1.0000 | ORAL_TABLET | Freq: Two times a day (BID) | ORAL | 0 refills | Status: DC | PRN
Start: 2016-11-16 — End: 2016-12-13

## 2016-11-16 NOTE — Telephone Encounter (Signed)
Pt mother calling for a refill of amphetamine-dextroamphetamine (ADDERALL) 10 MG tabletamphetamine-dextroamphetamine (ADDERALL) 10 MG tablet   HYDROcodone-acetaminophen (NORCO) 10-325 MG tablet  Pt mother would also like a call about scheduling another steroid treatment, it has been about a year and a half.  Please contact

## 2016-11-16 NOTE — Telephone Encounter (Signed)
Rx's up front [facility]/fim 

## 2016-11-16 NOTE — Telephone Encounter (Signed)
Rx's awaiting RAS sig/fim 

## 2016-11-16 NOTE — Addendum Note (Signed)
Addended by: Candis Schatz I on: 11/16/2016 02:14 PM   Modules accepted: Orders

## 2016-12-08 ENCOUNTER — Telehealth: Payer: Self-pay | Admitting: Neurology

## 2016-12-08 NOTE — Telephone Encounter (Signed)
Patient called office returning RN's call.  Please call °

## 2016-12-08 NOTE — Telephone Encounter (Signed)
LMTC.  RAS will need to see her.  Can add her on for 9am tomorrow/fim

## 2016-12-08 NOTE — Telephone Encounter (Signed)
Spoke to patient's mother - she will have her here at 8:45am for her 9am appt.

## 2016-12-08 NOTE — Telephone Encounter (Signed)
Pt's mother called request IVIG order for steroid. Said the pt's leg and back pain has increased over the past few days.  Please call

## 2016-12-09 ENCOUNTER — Ambulatory Visit: Payer: Self-pay | Admitting: Neurology

## 2016-12-09 ENCOUNTER — Telehealth: Payer: Self-pay | Admitting: Neurology

## 2016-12-09 NOTE — Telephone Encounter (Signed)
Pt's mother called said the patient is not feeling well this morning, she can't come to the appt and she should not have made the appt yesterday for early in the morning today. I explained the no show policy and she took offense. By the end of the conversation she seemed to have relaxed. She is wanting an appt in the afternoon next week if possible. Please call

## 2016-12-09 NOTE — Telephone Encounter (Signed)
Spoke with mom and r/s appt/fim

## 2016-12-13 ENCOUNTER — Encounter: Payer: Self-pay | Admitting: Neurology

## 2016-12-13 ENCOUNTER — Ambulatory Visit (INDEPENDENT_AMBULATORY_CARE_PROVIDER_SITE_OTHER): Payer: Medicare Other | Admitting: Neurology

## 2016-12-13 VITALS — BP 112/74 | Resp 14 | Ht 64.0 in | Wt 95.0 lb

## 2016-12-13 DIAGNOSIS — G35 Multiple sclerosis: Secondary | ICD-10-CM

## 2016-12-13 DIAGNOSIS — R208 Other disturbances of skin sensation: Secondary | ICD-10-CM | POA: Diagnosis not present

## 2016-12-13 DIAGNOSIS — F32A Depression, unspecified: Secondary | ICD-10-CM

## 2016-12-13 DIAGNOSIS — R269 Unspecified abnormalities of gait and mobility: Secondary | ICD-10-CM | POA: Diagnosis not present

## 2016-12-13 DIAGNOSIS — F329 Major depressive disorder, single episode, unspecified: Secondary | ICD-10-CM

## 2016-12-13 DIAGNOSIS — M5432 Sciatica, left side: Secondary | ICD-10-CM | POA: Diagnosis not present

## 2016-12-13 DIAGNOSIS — F09 Unspecified mental disorder due to known physiological condition: Secondary | ICD-10-CM | POA: Diagnosis not present

## 2016-12-13 DIAGNOSIS — R634 Abnormal weight loss: Secondary | ICD-10-CM | POA: Diagnosis not present

## 2016-12-13 DIAGNOSIS — R5383 Other fatigue: Secondary | ICD-10-CM

## 2016-12-13 MED ORDER — HYDROCODONE-ACETAMINOPHEN 10-325 MG PO TABS: 1.0000 | ORAL_TABLET | Freq: Two times a day (BID) | ORAL | 0 refills | Status: DC | PRN

## 2016-12-13 MED ORDER — LAMOTRIGINE 150 MG PO TABS: 150.0000 mg | ORAL_TABLET | Freq: Two times a day (BID) | ORAL | 11 refills | Status: DC

## 2016-12-13 MED ORDER — AMPHETAMINE-DEXTROAMPHETAMINE 10 MG PO TABS: 10.0000 mg | ORAL_TABLET | Freq: Two times a day (BID) | ORAL | 0 refills | Status: DC

## 2016-12-13 NOTE — Progress Notes (Signed)
u  [facility]  PATIENT: Sabrina [name] DOB: May 04, 1981    _________________________________   HISTORICAL  CHIEF COMPLAINT:  Chief Complaint  Patient presents with  . Multiple Sclerosis    Sts. she continues to tolerate Tysabri well.  JCV ab last checked 10/17/16 and was negative at 0.17.  She is here today with c/o increased mid back pain--along bra line, over the last 6 mos, and bilat leg pain.  She would like to discuss having IV steroids/fim  . Back Pain    HISTORY OF PRESENT ILLNESS:  Sabrina [name] is a 36 year old woman with multiple sclerosis and chronic pain.      Her MS seems to be stable but she has been hurting more.   She has lost a lot of weight but gained 4 pounds over the past 4 months and had her first period in 14 months.     MS:   She continues to do monthly Tysabri (in [place]) since 2016.   She is tolerating the infusions well.   She feels her MS has done better on Tysabrithan on Copaxone and she is tolerating it well.      She denies any MS exacerbation.     Gait/strength/sensation/pain:   She reports gait is worse with the pain and she is using walker for long distances (not in house).  No recent falls. The left leg is worse than the right with mild weakness and some spasticity.  She states her endurance is poor.     She has painful tingling in both feet.      Vision:      She notes no change in the right blurry vision.    She denies any diplopia.  Sciatica/pain:  Her worse pain is in the back of the left leg.    There is also a lot of pain in her adductors at night.      Piriformis injections helped a lot x 1 month and then pain returned.  She tries to do stretches.     Botox was injected 125 U on the left and 75 U on the right and she felt a pain benefit but felt flu-like x several weeks afterwards.  Because she felt a little weaker, she only did it once.    Pain though was better.  She only did this once.   Other med's including gabapentin,  Lyrica and Tegretol are not well tolerated.  Lamictal helps the pain some.      Lumbar MRI was normal.      Bladder:  She notes stable urinary frequency and urgency and has nocturnal incontinence once a month.  She has 1 - 2 times nocturia and sometimes has nocturnal incontinence.   In the past she was on oxybutynin with benefit.  She denies hesitrancy.      Fatigue/sleep:   She has fatigue is physical more than mental. Adderall has helped her fatigue quite a bit. .  Even though she has weight loss, she feels appetite is not suppressed.    Sometimes, she will take only one Adderall pill and occasionally does not take any..       She feels she sleeps much better now than last year and RLS/PLMS is better.    Mood/cognition:   She has depression and anxiety but is doing better than last year.  She notes stress with family and the holidays.   She has had some panic attacks and clonazepam helps the most.   She notes mild  cognitive issues with poor focus and attention and reduced verbal fluency.    She is on Pristiq and lamotrigine with benefit  MS History:  She had right optic neuritis in December 2012 and had an MRI of the brain showing optic nerve inflammation and many white matter spots.    She saw Dr. Macon Large in [place].   She was started on Copaxone.  She likely had an exacerbation in 2013 when she had trouble walking x 2 months.   She received a few days of IV Solu-Medrol.  Last year, she also received a few days of IV Solu-Medrol but she is uncertain   She felt she did well for the first 2 years but switched to Gilenya because she was tired of the shots last year.   At first, she tolerated it well but then her depression got much worse and she had some stomach issues so she returned to Copaxone last month (20 mg daily).   MRI early 2016 showed multiple old MS plaques and 1  Enhancing focus in the right frontal lobe prompting change to Tysabi.     REVIEW OF SYSTEMS: Constitutional: No fevers, chills,  sweats, or change in appetite.  Notes a lot of fatigue Eyes: No visual changes, double vision, eye pain Ear, nose and throat: No hearing loss, ear pain, nasal congestion, sore throat Cardiovascular: No chest pain, palpitations Respiratory: No shortness of breath at rest or with exertion.   No wheezes GastrointestinaI: No nausea, vomiting, diarrhea, abdominal pain, fecal incontinence Genitourinary: as above. Musculoskeletal: No neck pain, back pain Integumentary: No rash, pruritus, skin lesions Neurological: as above Psychiatric: Notes some depression and anxiety Endocrine: No palpitations, diaphoresis, change in appetite, change in weigh or increased thirst Hematologic/Lymphatic: No anemia, purpura, petechiae. Allergic/Immunologic: No itchy/runny eyes, nasal congestion, recent allergic reactions, rashes  ALLERGIES: Allergies  Allergen Reactions  . Amoxicillin   . Penicillins   . Sulfa Antibiotics   . Codeine Nausea And Vomiting    HOME MEDICATIONS:  Current Outpatient Prescriptions:  .  amphetamine-dextroamphetamine (ADDERALL) 10 MG tablet, Take 1 tablet (10 mg total) by mouth 2 (two) times daily with a meal., Disp: 60 tablet, Rfl: 0 .  b complex vitamins tablet, every day., Disp: , Rfl:  .  baclofen (LIORESAL) 10 MG tablet, Take 1 tablet (10 mg total) by mouth 3 (three) times daily., Disp: 90 each, Rfl: 3 .  Cholecalciferol 5000 UNITS TABS, Take by mouth., Disp: , Rfl:  .  clonazePAM (KLONOPIN) 0.5 MG tablet, Take 1 tablet (0.5 mg total) by mouth 3 (three) times daily as needed for anxiety., Disp: 90 tablet, Rfl: 5 .  cyclobenzaprine (FLEXERIL) 10 MG tablet, Take 10 mg by mouth 2 (two) times daily. Reported on 07/14/2015, Disp: , Rfl: 3 .  desvenlafaxine (PRISTIQ) 50 MG 24 hr tablet, Take 1 tablet (50 mg total) by mouth daily., Disp: 30 tablet, Rfl: 3 .  DHA-EPA-VITAMIN E PO, Take by mouth., Disp: , Rfl:  .  HYDROcodone-acetaminophen (NORCO) 10-325 MG tablet, Take 1 tablet by  mouth 2 (two) times daily as needed., Disp: 60 tablet, Rfl: 0 .  lamoTRIgine (LAMICTAL) 150 MG tablet, Take 1 tablet (150 mg total) by mouth 2 (two) times daily., Disp: 60 tablet, Rfl: 11 .  megestrol (MEGACE) 40 MG tablet, Take 1 tablet (40 mg total) by mouth daily., Disp: 30 tablet, Rfl: 11 .  methocarbamol (ROBAXIN) 500 MG tablet, Take 1 tablet (500 mg total) by mouth 3 (three) times daily., Disp: 90 tablet,  Rfl: 5 .  Multiple Vitamin (MULTI VITAMIN MENS) tablet, Take by mouth., Disp: , Rfl:  .  natalizumab (TYSABRI) 300 MG/15ML injection, Inject 15 mLs (300 mg total) into the vein every 30 (thirty) days., Disp: 15 mL, Rfl: 12 .  Norgestimate-Ethinyl Estradiol Triphasic 0.18/0.215/0.25 MG-35 MCG tablet, Take by mouth., Disp: , Rfl:  .  Omega-3 Fatty Acids (FISH OIL) 1000 MG CAPS, Take by mouth., Disp: , Rfl:  .  oxybutynin (DITROPAN) 5 MG tablet, Take 1 tablet (5 mg total) by mouth 2 (two) times daily as needed for bladder spasms., Disp: 60 tablet, Rfl: 11  PAST MEDICAL HISTORY: Past Medical History:  Diagnosis Date  . Headache   . Multiple sclerosis (HCC) 10/28/2014  . Vision abnormalities     PAST SURGICAL HISTORY: No past surgical history on file.  FAMILY HISTORY: Family History  Problem Relation Age of Onset  . Healthy Mother   . Diabetes type II Father   . Prostate cancer Father     SOCIAL HISTORY:  Social History   Social History  . Marital status: Single    Spouse name: N/A  . Number of children: N/A  . Years of education: N/A   Occupational History  . Not on file.   Social History Main Topics  . Smoking status: Current Every Day Smoker    Packs/day: 0.50    Types: Cigarettes  . Smokeless tobacco: Never Used  . Alcohol use No  . Drug use: No  . Sexual activity: Not on file   Other Topics Concern  . Not on file   Social History Narrative  . No narrative on file     PHYSICAL EXAM  Vitals:   12/13/16 0928  BP: 112/74  Resp: 14  Weight: 95 lb  (43.1 kg)  Height: 5\' 4"  (1.626 m)    Body mass index is 16.31 kg/m.   General: The patient is well-developed and well-nourished and in no acute distress  Musculoskeletal:   She is tender over the left > right piriformis muscle. No trochanteric bursa tenderness. Paraspinal muscles are just mildly tender.  Neurologic Exam  Mental status:  The patient is alert and oriented x 3 at the time of the examination. The patient has apparent normal recent and remote memory, with a mildly reduced attention span and concentration ability.     Cranial nerves: Extraocular movements are full.  Facial symmetry is present. There is good facial sensation to soft touch bilaterally.Facial strength is normal.  Trapezius and sternocleidomastoid strength is normal. No dysarthria is noted.   No obvious hearing deficits are noted.  Motor:  Muscle bulk is normal.   Tone is mildly increased in legs. Strength is  5/5.   Sensory: She has normal sensation in her arms. She feels less vibration sensation in the legs and the arms. There is slight reduce sensation to temperature  Coordination: Cerebellar testing reveals good finger-nose-finger bilaterally.  Gait and station: Station is normal.   Gait is mildly wide. Tandem gait is wide. Romberg is negative.   Reflexes: Deep tendon reflexes are symmetric and increased bilaterally with 2 beats clonue at the ankles.      DIAGNOSTIC DATA (LABS, IMAGING, TESTING) - I reviewed patient records, labs, notes, testing and imaging myself where available.       ASSESSMENT AND PLAN  Multiple sclerosis (HCC)  Left sided sciatica  Cognitive dysfunction  Abnormal weight loss  Dysesthesia  Gait disorder  Other fatigue  Depression, unspecified depression type  1.   She will Continue Tysabri.   Infusions in [place]   2.   Adderall for fatigue/attentional deficit. Continue Megace for her weight loss.    Renew hydrocodone also.  Continue other meds.      3.     Increase Lamotrigine to 150 mg po bid  4.   We discussed Botox instead of TPI's in the future.   She will hold off due to the expense.   5,  Bilateral piriformis TPI with 80 mg Depo-Medrol in [place] using sterile technique.   She tolerated the injection well  6.   rtc 4 months, sooner if problems and sooner for Botox  45 minute Face-to-face evaluation with greater than one half of calcium coordinating care about her MS and related symptoms.  Richard A. Epimenio Foot, MD, PhD 12/13/2016, 5:47 PM Certified in Neurology, Clinical Neurophysiology, Sleep Medicine, Pain Medicine and Neuroimaging  [facility] [address] [place], [place] [address] [phone]

## 2016-12-15 DIAGNOSIS — G35 Multiple sclerosis: Secondary | ICD-10-CM | POA: Diagnosis not present

## 2016-12-28 ENCOUNTER — Telehealth: Payer: Self-pay | Admitting: Neurology

## 2016-12-28 NOTE — Telephone Encounter (Signed)
Left message for Sabrina [name] that she needs to contact [facility].  I do not have access to their pa's/fim

## 2016-12-28 NOTE — Telephone Encounter (Signed)
Debbie from [employer] re: the authorization of Ludwig Clarks it expired on  12-16-16 needs to be completed asap, she said it was refaxed earlier today please complete form if questions [phone]

## 2017-01-12 ENCOUNTER — Telehealth: Payer: Self-pay | Admitting: Neurology

## 2017-01-12 MED ORDER — HYDROCODONE-ACETAMINOPHEN 10-325 MG PO TABS: 1.0000 | ORAL_TABLET | Freq: Two times a day (BID) | ORAL | 0 refills | Status: DC | PRN

## 2017-01-12 MED ORDER — AMPHETAMINE-DEXTROAMPHETAMINE 10 MG PO TABS: 10.0000 mg | ORAL_TABLET | Freq: Two times a day (BID) | ORAL | 0 refills | Status: DC

## 2017-01-12 NOTE — Telephone Encounter (Signed)
Rx. up front [facility]/fim 

## 2017-01-12 NOTE — Addendum Note (Signed)
Addended by: Candis Schatz I on: 01/12/2017 01:59 PM   Modules accepted: Orders

## 2017-01-12 NOTE — Telephone Encounter (Signed)
Rx's awaiting RAS sig/fim 

## 2017-01-12 NOTE — Telephone Encounter (Signed)
Patient requesting refill of HYDROcodone-acetaminophen (NORCO) 10-325 MG tablet and amphetamine-dextroamphetamine (ADDERALL) 10 MG tablet.

## 2017-01-16 DIAGNOSIS — G35 Multiple sclerosis: Secondary | ICD-10-CM | POA: Diagnosis not present

## 2017-02-10 ENCOUNTER — Telehealth: Payer: Self-pay | Admitting: Neurology

## 2017-02-10 MED ORDER — HYDROCODONE-ACETAMINOPHEN 10-325 MG PO TABS: 1.0000 | ORAL_TABLET | Freq: Two times a day (BID) | ORAL | 0 refills | Status: DC | PRN

## 2017-02-10 MED ORDER — AMPHETAMINE-DEXTROAMPHETAMINE 10 MG PO TABS: 10.0000 mg | ORAL_TABLET | Freq: Two times a day (BID) | ORAL | 0 refills | Status: DC

## 2017-02-10 NOTE — Telephone Encounter (Signed)
Rx's up front [facility]/fim 

## 2017-02-10 NOTE — Telephone Encounter (Signed)
Rx's awaiting RAS sig/fim 

## 2017-02-10 NOTE — Telephone Encounter (Signed)
Patient called office requesting refills for amphetamine-dextroamphetamine (ADDERALL) 10 MG tablet and HYDROcodone-acetaminophen (NORCO) 10-325 MG tablet.

## 2017-02-10 NOTE — Addendum Note (Signed)
Addended by: Candis Schatz I on: 02/10/2017 10:47 AM   Modules accepted: Orders

## 2017-02-14 ENCOUNTER — Telehealth: Payer: Self-pay | Admitting: *Deleted

## 2017-02-14 DIAGNOSIS — G35 Multiple sclerosis: Secondary | ICD-10-CM | POA: Diagnosis not present

## 2017-02-14 NOTE — Telephone Encounter (Signed)
Attempted twice to initiate PA with phone number [facility] provided on fax for PA, [phone]. Number is for pharmacy only, never was able to speak to person. Started PA on cover my meds.

## 2017-02-16 NOTE — Telephone Encounter (Signed)
Still no PA determination on Cover My Meds. PA paperwork placed on Faith, RN's desk for follow up.

## 2017-02-18 ENCOUNTER — Other Ambulatory Visit: Payer: Self-pay | Admitting: Neurology

## 2017-02-20 ENCOUNTER — Telehealth: Payer: Self-pay | Admitting: Neurology

## 2017-02-20 ENCOUNTER — Encounter (HOSPITAL_COMMUNITY): Payer: Self-pay

## 2017-02-20 ENCOUNTER — Telehealth: Payer: Self-pay | Admitting: Diagnostic Neuroimaging

## 2017-02-20 DIAGNOSIS — F329 Major depressive disorder, single episode, unspecified: Secondary | ICD-10-CM | POA: Diagnosis present

## 2017-02-20 DIAGNOSIS — Z881 Allergy status to other antibiotic agents status: Secondary | ICD-10-CM

## 2017-02-20 DIAGNOSIS — N3944 Nocturnal enuresis: Secondary | ICD-10-CM | POA: Diagnosis present

## 2017-02-20 DIAGNOSIS — E876 Hypokalemia: Secondary | ICD-10-CM | POA: Diagnosis not present

## 2017-02-20 DIAGNOSIS — E43 Unspecified severe protein-calorie malnutrition: Secondary | ICD-10-CM | POA: Diagnosis not present

## 2017-02-20 DIAGNOSIS — D72829 Elevated white blood cell count, unspecified: Secondary | ICD-10-CM | POA: Diagnosis present

## 2017-02-20 DIAGNOSIS — F429 Obsessive-compulsive disorder, unspecified: Secondary | ICD-10-CM | POA: Diagnosis present

## 2017-02-20 DIAGNOSIS — Z681 Body mass index (BMI) 19 or less, adult: Secondary | ICD-10-CM | POA: Diagnosis not present

## 2017-02-20 DIAGNOSIS — R29898 Other symptoms and signs involving the musculoskeletal system: Secondary | ICD-10-CM

## 2017-02-20 DIAGNOSIS — M797 Fibromyalgia: Secondary | ICD-10-CM | POA: Diagnosis present

## 2017-02-20 DIAGNOSIS — Z79899 Other long term (current) drug therapy: Secondary | ICD-10-CM

## 2017-02-20 DIAGNOSIS — Z880 Allergy status to penicillin: Secondary | ICD-10-CM

## 2017-02-20 DIAGNOSIS — R4189 Other symptoms and signs involving cognitive functions and awareness: Secondary | ICD-10-CM

## 2017-02-20 DIAGNOSIS — F17210 Nicotine dependence, cigarettes, uncomplicated: Secondary | ICD-10-CM | POA: Diagnosis present

## 2017-02-20 DIAGNOSIS — R5383 Other fatigue: Secondary | ICD-10-CM

## 2017-02-20 DIAGNOSIS — Z833 Family history of diabetes mellitus: Secondary | ICD-10-CM

## 2017-02-20 DIAGNOSIS — F411 Generalized anxiety disorder: Secondary | ICD-10-CM | POA: Diagnosis present

## 2017-02-20 DIAGNOSIS — Z885 Allergy status to narcotic agent status: Secondary | ICD-10-CM

## 2017-02-20 DIAGNOSIS — R64 Cachexia: Secondary | ICD-10-CM | POA: Diagnosis not present

## 2017-02-20 DIAGNOSIS — G894 Chronic pain syndrome: Secondary | ICD-10-CM | POA: Diagnosis present

## 2017-02-20 DIAGNOSIS — G35 Multiple sclerosis: Secondary | ICD-10-CM | POA: Diagnosis not present

## 2017-02-20 DIAGNOSIS — H469 Unspecified optic neuritis: Secondary | ICD-10-CM | POA: Diagnosis not present

## 2017-02-20 DIAGNOSIS — R292 Abnormal reflex: Secondary | ICD-10-CM | POA: Diagnosis present

## 2017-02-20 NOTE — Telephone Encounter (Signed)
Noted/fim 

## 2017-02-20 NOTE — Telephone Encounter (Signed)
I have spoken with Sabrina [name] this afternoon.  She sts. pt. c/o increased fatigue, difficulty with cognition--"mental clarity."  (not disoriented). Also more pain/weakness right leg.  Last MRI brain 01/2016, and last MRI c-spine 08/2015.  Per YY, ok to repeat these studies.  Orders in EPIC.  Sabrina [name] is agreeable with this plan/fim

## 2017-02-20 NOTE — Telephone Encounter (Signed)
Patient's mother called in to report new sxs of pain, weakness, loss of appetite, starting on 02/18/17. Patient is in severe pain (tailbone region). No fevers. Patient not eating or drinking well. Patient is also lethargic appearing, awake but "out of it". Mother concerned about MS flare up or something else serious. Advised for them to go to ER for immediate evaluation.   Differential Diagnosis: infection, MS flare up, severe pain, dehydration, failure to thrive   RECOMMENDATIONS: - go to ER for immediate evaluation (advised to call 911; patient's mother wants to go to [facility] ER via private vehicle)  - consider infection workup, IV fluids and symptom/pain mgmt per ER and neurohospitalist team (if needed)  - consider MRI brain, cervical and thoracic (with and without) for evaluation of MS flare up vs PML (on tysabri)   Suanne Marker, MD 02/20/2017, 8:19 PM Certified in Neurology, Neurophysiology and Neuroimaging  [facility] [address], [address] [place], [place] [address] [phone]

## 2017-02-20 NOTE — Telephone Encounter (Signed)
Pt's mother called the clinic pt is experiencing mental fog, rt leg weakness and pain, hurting in the back, excessive sleeping starting Saturday 02/18/17. Please call to advise

## 2017-02-20 NOTE — Telephone Encounter (Signed)
Spoke to [place] at [facility] she is going to call the patient right now and schedule it.

## 2017-02-20 NOTE — ED Triage Notes (Signed)
Pt reports she believes she is having a MS flare up that started about 1 week ago. PT reports right and left leg pain, "foggy head", and being extremely tired.

## 2017-02-21 ENCOUNTER — Inpatient Hospital Stay (HOSPITAL_COMMUNITY): Payer: Medicare Other

## 2017-02-21 ENCOUNTER — Encounter (HOSPITAL_COMMUNITY): Payer: Self-pay | Admitting: Nurse Practitioner

## 2017-02-21 ENCOUNTER — Inpatient Hospital Stay (HOSPITAL_COMMUNITY)
Admission: EM | Admit: 2017-02-21 | Discharge: 2017-02-23 | DRG: 058 | Disposition: A | Discharge status: Home Health | Payer: Medicare Other | Attending: Internal Medicine | Admitting: Internal Medicine

## 2017-02-21 DIAGNOSIS — D72829 Elevated white blood cell count, unspecified: Secondary | ICD-10-CM

## 2017-02-21 DIAGNOSIS — G35D Multiple sclerosis, unspecified: Secondary | ICD-10-CM | POA: Diagnosis present

## 2017-02-21 DIAGNOSIS — M797 Fibromyalgia: Secondary | ICD-10-CM | POA: Diagnosis not present

## 2017-02-21 DIAGNOSIS — F411 Generalized anxiety disorder: Secondary | ICD-10-CM | POA: Diagnosis not present

## 2017-02-21 DIAGNOSIS — R64 Cachexia: Secondary | ICD-10-CM | POA: Diagnosis present

## 2017-02-21 DIAGNOSIS — N3944 Nocturnal enuresis: Secondary | ICD-10-CM | POA: Diagnosis present

## 2017-02-21 DIAGNOSIS — E876 Hypokalemia: Secondary | ICD-10-CM

## 2017-02-21 DIAGNOSIS — G894 Chronic pain syndrome: Secondary | ICD-10-CM | POA: Diagnosis not present

## 2017-02-21 DIAGNOSIS — E43 Unspecified severe protein-calorie malnutrition: Secondary | ICD-10-CM | POA: Diagnosis not present

## 2017-02-21 DIAGNOSIS — R292 Abnormal reflex: Secondary | ICD-10-CM | POA: Diagnosis present

## 2017-02-21 DIAGNOSIS — G8929 Other chronic pain: Secondary | ICD-10-CM | POA: Diagnosis not present

## 2017-02-21 DIAGNOSIS — Z880 Allergy status to penicillin: Secondary | ICD-10-CM | POA: Diagnosis not present

## 2017-02-21 DIAGNOSIS — H469 Unspecified optic neuritis: Secondary | ICD-10-CM | POA: Diagnosis present

## 2017-02-21 DIAGNOSIS — F429 Obsessive-compulsive disorder, unspecified: Secondary | ICD-10-CM | POA: Diagnosis not present

## 2017-02-21 DIAGNOSIS — Z833 Family history of diabetes mellitus: Secondary | ICD-10-CM | POA: Diagnosis not present

## 2017-02-21 DIAGNOSIS — Z681 Body mass index (BMI) 19 or less, adult: Secondary | ICD-10-CM | POA: Diagnosis not present

## 2017-02-21 DIAGNOSIS — G35 Multiple sclerosis: Secondary | ICD-10-CM | POA: Diagnosis not present

## 2017-02-21 DIAGNOSIS — F17210 Nicotine dependence, cigarettes, uncomplicated: Secondary | ICD-10-CM | POA: Diagnosis present

## 2017-02-21 DIAGNOSIS — Z881 Allergy status to other antibiotic agents status: Secondary | ICD-10-CM | POA: Diagnosis not present

## 2017-02-21 DIAGNOSIS — Z79899 Other long term (current) drug therapy: Secondary | ICD-10-CM | POA: Diagnosis not present

## 2017-02-21 DIAGNOSIS — Z885 Allergy status to narcotic agent status: Secondary | ICD-10-CM | POA: Diagnosis not present

## 2017-02-21 DIAGNOSIS — F329 Major depressive disorder, single episode, unspecified: Secondary | ICD-10-CM | POA: Diagnosis present

## 2017-02-21 HISTORY — DX: Generalized anxiety disorder: F41.1

## 2017-02-21 HISTORY — DX: Chronic pain syndrome: G89.4

## 2017-02-21 HISTORY — DX: Major depressive disorder, single episode, unspecified: F32.9

## 2017-02-21 HISTORY — DX: Obsessive-compulsive disorder, unspecified: F42.9

## 2017-02-21 HISTORY — DX: Fibromyalgia: M79.7

## 2017-02-21 HISTORY — DX: Depression, unspecified: F32.A

## 2017-02-21 LAB — BASIC METABOLIC PANEL
Anion gap: 7 (ref 5–15)
BUN: 8 mg/dL (ref 6–20)
CALCIUM: 9.2 mg/dL (ref 8.9–10.3)
CHLORIDE: 106 mmol/L (ref 101–111)
CO2: 26 mmol/L (ref 22–32)
CREATININE: 0.54 mg/dL (ref 0.44–1.00)
GFR calc non Af Amer: >60 mL/min (ref >60)
Glucose, Bld: 98 mg/dL (ref 65–99)
Potassium: 3.7 mmol/L (ref 3.5–5.1)
SODIUM: 139 mmol/L (ref 135–145)

## 2017-02-21 LAB — CBC WITH DIFFERENTIAL/PLATELET
Basophils Absolute: 0 10*3/uL (ref 0.0–0.1)
Basophils Relative: 0 %
EOS PCT: 2 %
Eosinophils Absolute: 0.2 10*3/uL (ref 0.0–0.7)
HCT: 42.1 % (ref 36.0–46.0)
HEMOGLOBIN: 14 g/dL (ref 12.0–15.0)
LYMPHS ABS: 3.8 10*3/uL (ref 0.7–4.0)
Lymphocytes Relative: 42 %
MCH: 30.3 pg (ref 26.0–34.0)
MCHC: 33.3 g/dL (ref 30.0–36.0)
MCV: 91.1 fL (ref 78.0–100.0)
Monocytes Absolute: 0.7 10*3/uL (ref 0.1–1.0)
Monocytes Relative: 8 %
NEUTROS PCT: 48 %
Neutro Abs: 4.3 10*3/uL (ref 1.7–7.7)
PLATELETS: 254 10*3/uL (ref 150–400)
RBC: 4.62 MIL/uL (ref 3.87–5.11)
RDW: 12.6 % (ref 11.5–15.5)
WBC: 9 10*3/uL (ref 4.0–10.5)

## 2017-02-21 LAB — MAGNESIUM: MAGNESIUM: 1.9 mg/dL (ref 1.7–2.4)

## 2017-02-21 LAB — URINALYSIS, ROUTINE W REFLEX MICROSCOPIC
Bilirubin Urine: NEGATIVE
GLUCOSE, UA: NEGATIVE mg/dL
HGB URINE DIPSTICK: NEGATIVE
Ketones, ur: NEGATIVE mg/dL
Leukocytes, UA: NEGATIVE
Nitrite: NEGATIVE
Protein, ur: NEGATIVE mg/dL
SPECIFIC GRAVITY, URINE: 1.008 (ref 1.005–1.030)
pH: 6 (ref 5.0–8.0)

## 2017-02-21 LAB — HCG, QUANTITATIVE, PREGNANCY: hCG, Beta Chain, Quant, S: 2 m[IU]/mL (ref <5)

## 2017-02-21 LAB — HIV ANTIBODY (ROUTINE TESTING W REFLEX): HIV SCREEN 4TH GENERATION: NONREACTIVE

## 2017-02-21 LAB — I-STAT BETA HCG BLOOD, ED (MC, WL, AP ONLY): I-stat hCG, quantitative: 6.2 m[IU]/mL — ABNORMAL HIGH (ref <5)

## 2017-02-21 LAB — TSH: TSH: 1.239 u[IU]/mL (ref 0.350–4.500)

## 2017-02-21 LAB — PHOSPHORUS: PHOSPHORUS: 2.9 mg/dL (ref 2.5–4.6)

## 2017-02-21 MED ORDER — BACLOFEN 10 MG PO TABS
10.0000 mg | ORAL_TABLET | Freq: Three times a day (TID) | ORAL | Status: DC
  Administered 2017-02-21 – 2017-02-23 (×7): 10 mg via ORAL
  Filled 2017-02-21 (×7): qty 1

## 2017-02-21 MED ORDER — LAMOTRIGINE 150 MG PO TABS
150.0000 mg | ORAL_TABLET | Freq: Two times a day (BID) | ORAL | Status: DC
  Administered 2017-02-21 – 2017-02-23 (×5): 150 mg via ORAL
  Filled 2017-02-21 (×5): qty 1

## 2017-02-21 MED ORDER — SODIUM CHLORIDE 0.9 % IV SOLN
1000.0000 mg | Freq: Once | INTRAVENOUS | Status: AC
  Administered 2017-02-21: 1000 mg via INTRAVENOUS
  Filled 2017-02-21: qty 8

## 2017-02-21 MED ORDER — CLONAZEPAM 0.5 MG PO TABS
0.5000 mg | ORAL_TABLET | Freq: Three times a day (TID) | ORAL | Status: DC | PRN
  Administered 2017-02-21 – 2017-02-23 (×4): 0.5 mg via ORAL
  Filled 2017-02-21 (×4): qty 1

## 2017-02-21 MED ORDER — ENOXAPARIN SODIUM 30 MG/0.3ML ~~LOC~~ SOLN
30.0000 mg | SUBCUTANEOUS | Status: DC
  Administered 2017-02-21 – 2017-02-22 (×2): 30 mg via SUBCUTANEOUS
  Filled 2017-02-21 (×2): qty 0.3

## 2017-02-21 MED ORDER — VENLAFAXINE HCL ER 75 MG PO CP24
75.0000 mg | ORAL_CAPSULE | Freq: Every day | ORAL | Status: DC
  Administered 2017-02-22 – 2017-02-23 (×2): 75 mg via ORAL
  Filled 2017-02-21 (×3): qty 1

## 2017-02-21 MED ORDER — SODIUM CHLORIDE 0.9 % IV BOLUS (SEPSIS)
1000.0000 mL | Freq: Once | INTRAVENOUS | Status: AC
  Administered 2017-02-21: 1000 mL via INTRAVENOUS

## 2017-02-21 MED ORDER — MEGESTROL ACETATE 400 MG/10ML PO SUSP
400.0000 mg | Freq: Every day | ORAL | Status: DC
  Administered 2017-02-22 – 2017-02-23 (×2): 400 mg via ORAL
  Filled 2017-02-21 (×2): qty 10

## 2017-02-21 MED ORDER — SODIUM CHLORIDE 0.9 % IV SOLN
1000.0000 mg | Freq: Every day | INTRAVENOUS | Status: AC
  Administered 2017-02-22 – 2017-02-23 (×2): 1000 mg via INTRAVENOUS
  Filled 2017-02-21 (×2): qty 8

## 2017-02-21 MED ORDER — ACETAMINOPHEN 650 MG RE SUPP
650.0000 mg | Freq: Four times a day (QID) | RECTAL | Status: DC | PRN
Start: 2017-02-21 — End: 2017-02-23

## 2017-02-21 MED ORDER — ACETAMINOPHEN 325 MG PO TABS: 650.0000 mg | ORAL_TABLET | Freq: Four times a day (QID) | ORAL | Status: DC | PRN

## 2017-02-21 MED ORDER — MEGESTROL ACETATE 40 MG PO TABS
40.0000 mg | ORAL_TABLET | Freq: Every day | ORAL | Status: DC
  Administered 2017-02-21: 40 mg via ORAL
  Filled 2017-02-21: qty 1

## 2017-02-21 MED ORDER — FENTANYL CITRATE (PF) 100 MCG/2ML IJ SOLN
25.0000 ug | Freq: Once | INTRAMUSCULAR | Status: AC
  Administered 2017-02-21: 25 ug via INTRAVENOUS
  Filled 2017-02-21: qty 2

## 2017-02-21 MED ORDER — SODIUM CHLORIDE 0.9 % IV SOLN
INTRAVENOUS | Status: DC
  Administered 2017-02-21 – 2017-02-23 (×3): via INTRAVENOUS

## 2017-02-21 MED ORDER — ONDANSETRON HCL 4 MG PO TABS: 4.0000 mg | ORAL_TABLET | Freq: Four times a day (QID) | ORAL | Status: DC | PRN

## 2017-02-21 MED ORDER — AMPHETAMINE-DEXTROAMPHETAMINE 10 MG PO TABS
10.0000 mg | ORAL_TABLET | Freq: Two times a day (BID) | ORAL | Status: DC
  Administered 2017-02-21 – 2017-02-23 (×4): 10 mg via ORAL
  Filled 2017-02-21 (×4): qty 1

## 2017-02-21 MED ORDER — ONDANSETRON HCL 4 MG/2ML IJ SOLN
4.0000 mg | Freq: Four times a day (QID) | INTRAMUSCULAR | Status: DC | PRN
  Administered 2017-02-22: 4 mg via INTRAVENOUS
  Filled 2017-02-21: qty 2

## 2017-02-21 MED ORDER — HYDROCODONE-ACETAMINOPHEN 10-325 MG PO TABS
1.0000 | ORAL_TABLET | Freq: Two times a day (BID) | ORAL | Status: DC | PRN
  Administered 2017-02-21 – 2017-02-23 (×5): 1 via ORAL
  Filled 2017-02-21 (×5): qty 1

## 2017-02-21 MED ORDER — SODIUM CHLORIDE 0.9 % IV BOLUS (SEPSIS)
500.0000 mL | Freq: Once | INTRAVENOUS | Status: AC
  Administered 2017-02-21: 500 mL via INTRAVENOUS

## 2017-02-21 NOTE — Consult Note (Signed)
NEURO HOSPITALIST CONSULT NOTE   Requestig physician: Dr. Bebe Shaggy  Reason for Consult: MS flare  History obtained from:  Patient and Chart    HPI:                                                                                                                                          Sabrina [name] is an 36 y.o. female who presents with a one week history of bilateral lower extremity weakness which she feels is due to an MS flare. She is seen as an outpatient at [facility] and is on monthly Tysabri infusions. Her last treatment with pulsed dose steroids for an MS flare was approximately 2 years ago. She endorses a prior history of optic neuritis, but is unsure which eye was affected. She is incontinent of urine during sleep.   Past Medical History:  Diagnosis Date  . Headache   . Multiple sclerosis (HCC) 10/28/2014  . Vision abnormalities     History reviewed. No pertinent surgical history.  Family History  Problem Relation Age of Onset  . Healthy Mother   . Diabetes type II Father   . Prostate cancer Father    Social History:  reports that she has been smoking Cigarettes.  She has been smoking about 0.50 packs per day. She has never used smokeless tobacco. She reports that she does not drink alcohol or use drugs.  Allergies  Allergen Reactions  . Amoxicillin Anaphylaxis  . Penicillins Anaphylaxis  . Sulfa Antibiotics Other (See Comments)    unknown  . Codeine Nausea And Vomiting    HOME MEDICATIONS:                                                                                                                        ROS:  No complaint of headache, chest pain or limb pain. Other ROS as per HPI.    Blood pressure 105/66, pulse 78, temperature 97.8 F (36.6 C), temperature source Oral, resp. rate 16, last menstrual period  02/13/2017, SpO2 99 %.   General Examination:                                                                                                      HEENT-  [place]/AT   Lungs- Respirations unlabored Extremities- No edema  Neurological Examination Mental Status: Alert, oriented, thought content appropriate.  Speech fluent without evidence of aphasia.  Able to follow all commands without difficulty. Cranial Nerves: II: Visual fields intact bilaterally. RAPD on right.   III,IV, VI: ptosis not present, EOMI without nystagmus V,VII: smile symmetric, facial temp sensation normal bilaterally VIII: hearing intact to voice IX,X: no hypophonia XI: symmetric XII: midline tongue extension Motor: Right : Upper extremity   4+/5    Left:     Upper extremity   4+/5  Lower extremity   4+5/5    Lower extremity   4-/5 Increased tone bilateral lower extremities. Sensory: Temp and light touch intact x 4. No extinction. Deep Tendon Reflexes: Hyperactive x 4. Right toe upgoing.  Cerebellar: No ataxia with FNF bilaterally Gait: Deferred   Lab Results: Basic Metabolic Panel:  Recent Labs Lab 02/21/17 0319  NA 139  K 3.7  CL 106  CO2 26  GLUCOSE 98  BUN 8  CREATININE 0.54  CALCIUM 9.2    Liver Function Tests: No results for input(s): AST, ALT, ALKPHOS, BILITOT, PROT, ALBUMIN in the last 168 hours. No results for input(s): LIPASE, AMYLASE in the last 168 hours. No results for input(s): AMMONIA in the last 168 hours.  CBC:  Recent Labs Lab 02/21/17 0319  WBC 9.0  NEUTROABS 4.3  HGB 14.0  HCT 42.1  MCV 91.1  PLT 254    Cardiac Enzymes: No results for input(s): CKTOTAL, CKMB, CKMBINDEX, TROPONINI in the last 168 hours.  Lipid Panel: No results for input(s): CHOL, TRIG, HDL, CHOLHDL, VLDL, LDLCALC in the last 168 hours.  CBG: No results for input(s): GLUCAP in the last 168 hours.  Microbiology: No results found for this or any previous visit.  Coagulation Studies: No results for  input(s): LABPROT, INR in the last 72 hours.  Imaging: No results found.  Assessment: 36 year old female with MS flare 1. Deficits include hyperreflexia, bilateral left worse than right lower extremity weakness and right RAPD. Of these, the lower extremity deficits are stated by patient to have worsened.  2. On Tysabri infusions monthly.  Recommendations: 1. Pulsed dose steroids: IV solumedrol 1000 mg qd x 3 days. Discussed risks/benefits with patient, who expressed understanding and agreement with the plan.  2. MRI brain with and without contrast. 3. MRI thoracic spine with and without contrast. 4. PT and OT consults   Electronically signed: Dr. Caryl Pina 02/21/2017, 6:59 AM

## 2017-02-21 NOTE — Progress Notes (Addendum)
ED report received at [phone] and pt pt arrived to the unit at 0840. Pt A&O x4; MAE x4; IV intact and transfusing; pt oriented to the unit and room; fall/safety precaution and prevention education completed. Bed alarm on; VSS; skin intact with no pressure ulcer or opened wounds noted. Pt in bed with call light within reach and family at bedside. Will closely monitor. Dionne Bucy RN

## 2017-02-21 NOTE — Telephone Encounter (Signed)
Noted/fim 

## 2017-02-21 NOTE — ED Notes (Signed)
Ordered breakfast tray  

## 2017-02-21 NOTE — ED Notes (Signed)
Patient updated on delays and wait times 

## 2017-02-21 NOTE — ED Provider Notes (Signed)
[facility]ER[facility]ENCY DEPT Provider Note   CSN: [id number] Arrival date & time: 02/20/17  2152     History   Chief Complaint Chief Complaint  Patient presents with  . Multiple Sclerosis    flare up    HPI Norena Bratton is a 36 y.o. female.  The history is provided by the patient and the spouse.    Patient presents with multiple complaints.  She has h/o multiple sclerosis and she feels she may be having a MS flare She reports body aches and also "tailbone" pain, she also reports hip pain as well She reports ongoing fatigue for over a week She denies HA/fever/vomiting She does report mild blurred vision in left eye She reports generalized weakness Her condition is worsening and nothing improves her symptoms She also reports some dizziness with walking   Past Medical History:  Diagnosis Date  . Headache   . Multiple sclerosis (HCC) 10/28/2014  . Vision abnormalities     Patient Active Problem List   Diagnosis Date Noted  . Muscle spasticity 07/14/2015  . Obsessive-compulsive disorder 06/11/2015  . Left sided sciatica 06/11/2015  . Anxiety, generalized 01/27/2015  . Clinical depression 01/27/2015  . 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 Multiple sclerosis (HCC) 10/28/2014  . Other fatigue 10/28/2014  . Gait disorder 10/28/2014  . Lumbosacral radiculopathy at S1 10/28/2014  . Cognitive dysfunction 10/28/2014  . Urinary frequency 10/28/2014  . Dysesthesia 10/28/2014  . Blurred vision 12/28/2011  . Leg weakness 12/28/2011  . Exacerbation of multiple sclerosis (HCC) 12/28/2011  . Leg paresthesia 12/28/2011  . Compulsive tobacco user syndrome 12/28/2011  . Current tobacco use 12/28/2011  . Burning sensation of feet 10/10/2011  . DS (disseminated sclerosis) (HCC) 09/20/2011  . Family history of diabetes mellitus type II 04/21/2011  . Decreased body weight 04/21/2011  . Abnormal weight loss 04/21/2011    History reviewed. No pertinent surgical history.  OB History    No  data available       Home Medications    Prior to Admission medications   Medication Sig Start Date End Date Taking? Authorizing Provider  amphetamine-dextroamphetamine (ADDERALL) 10 MG tablet Take 1 tablet (10 mg total) by mouth 2 (two) times daily with a meal. 02/10/17  Yes Sater, Pearletha Furl, MD  baclofen (LIORESAL) 10 MG tablet Take 1 tablet (10 mg total) by mouth 3 (three) times daily. 08/07/15  Yes Sater, Pearletha Furl, MD  clonazePAM (KLONOPIN) 0.5 MG tablet Take 1 tablet (0.5 mg total) by mouth 3 (three) times daily as needed for anxiety. 10/17/16  Yes Sater, Pearletha Furl, MD  desvenlafaxine (PRISTIQ) 50 MG 24 hr tablet TAKE 1 TABLET(50 MG) BY MOUTH DAILY 02/20/17  Yes Sater, Pearletha Furl, MD  HYDROcodone-acetaminophen (NORCO) 10-325 MG tablet Take 1 tablet by mouth 2 (two) times daily as needed. 02/10/17  Yes Sater, Pearletha Furl, MD  lamoTRIgine (LAMICTAL) 150 MG tablet Take 1 tablet (150 mg total) by mouth 2 (two) times daily. 12/13/16  Yes Sater, Pearletha Furl, MD  megestrol (MEGACE) 40 MG tablet Take 1 tablet (40 mg total) by mouth daily. 10/17/16  Yes Sater, Pearletha Furl, MD  natalizumab (TYSABRI) 300 MG/15ML injection Inject 15 mLs (300 mg total) into the vein every 30 (thirty) days. 05/17/16  Yes Sater, Pearletha Furl, MD  methocarbamol (ROBAXIN) 500 MG tablet Take 1 tablet (500 mg total) by mouth 3 (three) times daily. Patient not taking: Reported on 02/21/2017 10/28/14   Asa Lente, MD  oxybutynin (DITROPAN) 5 MG tablet Take 1  tablet (5 mg total) by mouth 2 (two) times daily as needed for bladder spasms. Patient not taking: Reported on 02/21/2017 10/17/16   Asa Lente, MD    Family History Family History  Problem Relation Age of Onset  . Healthy Mother   . Diabetes type II Father   . Prostate cancer Father     Social History Social History  Substance Use Topics  . Smoking status: Current Every Day Smoker    Packs/day: 0.50    Types: Cigarettes  . Smokeless tobacco: Never Used  . Alcohol use No       Allergies   Amoxicillin; Penicillins; Sulfa antibiotics; and Codeine   Review of Systems Review of Systems  Constitutional: Positive for fatigue. Negative for fever.  Eyes: Positive for visual disturbance.  Cardiovascular: Negative for chest pain.  Gastrointestinal: Negative for vomiting.  Genitourinary: Negative for dysuria.  Musculoskeletal: Positive for back pain.  Neurological: Positive for weakness. Negative for headaches.  All other systems reviewed and are negative.    Physical Exam Updated Vital Signs BP 104/80   Pulse 72   Temp 97.8 F (36.6 C) (Oral)   Resp 16   LMP 02/13/2017   SpO2 95%   Physical Exam CONSTITUTIONAL: Cachectic, chronically ill appearing HEAD: Normocephalic/atraumatic EYES: EOMI/PERRL ENMT: Mucous membranes moist, poor dentition NECK: supple no meningeal signs SPINE/BACK:entire spine nontender, mild tenderness over lower lumbar region, bony prominence of coccyx but no erythema or skin breakdown CV: S1/S2 noted, no murmurs/rubs/gallops noted LUNGS: Lungs are clear to auscultation bilaterally, no apparent distress ABDOMEN: soft, nontender, no rebound or guarding, bowel sounds noted throughout abdomen GU:no cva tenderness NEURO: Pt is awake/alert/appropriate, moves all extremitiesx4.  No facial droop.  No arm/leg drift noted.   EXTREMITIES: pulses normal/equal, full ROM SKIN: warm, color normal PSYCH: no abnormalities of mood noted, alert and oriented to situation   ED Treatments / Results  Labs (all labs ordered are listed, but only abnormal results are displayed) Labs Reviewed  I-STAT BETA HCG BLOOD, ED ([facility], [facility], [facility] ONLY) - Abnormal; Notable for the following:       Result Value   I-stat hCG, quantitative 6.2 (*)    All other components within normal limits  BASIC METABOLIC PANEL  CBC WITH DIFFERENTIAL/PLATELET  HCG, QUANTITATIVE, PREGNANCY  URINALYSIS, ROUTINE W REFLEX MICROSCOPIC    EKG  EKG Interpretation None        Radiology No results found.  Procedures Procedures (including critical care time)  Medications Ordered in ED Medications  methylPREDNISolone sodium succinate (SOLU-MEDROL) 1,000 mg in sodium chloride 0.9 % 50 mL IVPB (not administered)  sodium chloride 0.9 % bolus 1,000 mL (0 mLs Intravenous Stopped 02/21/17 0412)  fentaNYL (SUBLIMAZE) injection 25 mcg (25 mcg Intravenous Given 02/21/17 0325)  sodium chloride 0.9 % bolus 500 mL (0 mLs Intravenous Stopped 02/21/17 0650)     Initial Impression / Assessment and Plan / ED Course  I have reviewed the triage vital signs and the nursing notes.  Pertinent labs   results that were available during my care of the patient were reviewed by me and considered in my medical decision making (see chart for details).     6:11 AM Pt stable Labs unremarkable I have d/w dr Otelia Limes with neurology and he will see patient Pt requesting more pain medications 7:14 AM D/w dr Otelia Limes He recommends solumedrol 1gm daily for 3 days and admit to hospitalist D/w triad for admission   Final Clinical Impressions(s) / ED Diagnoses  Final diagnoses:  Multiple sclerosis exacerbation (HCC)    New Prescriptions New Prescriptions   No medications on file     Zadie Rhine, MD 02/21/17 [phone]

## 2017-02-21 NOTE — H&P (Signed)
History and Physical    Sabrina [name] KWI:[id number] DOB: [birth year] DOA: 02/21/2017   PCP: Lorelei Pont, DO /UNASSIGNED  Attending physician: Konrad Dolores  Patient coming from/Resides with: Private residence  Chief Complaint: Multiple sclerosis exacerbation  HPI: Sabrina [name] is a 36 y.o. female with medical history significant for relapsing-remitting multiple sclerosis, OCD, generalized anxiety disorder, fibromyalgia and chronic pain syndrome. She presents to the ER complaining of one week of progressive bilateral leg weakness, "cloudy headedness", migratory visual blurriness. She also reported nocturnal urinary incontinence that awakens her from sleep. She states these symptoms are typical of her previous MS exacerbations. She's been evaluated by neurology since arrival and recommendations been made regarding treatment.  ED Course:  Vital Signs: BP 118/79   Pulse 75   Temp 97.8 F (36.6 C) (Oral)   Resp 16   LMP 02/13/2017   SpO2 100%  Lab data: Sodium 139, potassium 3.7, chloride 106, CO2 26, glucose 98, BUN 8, creatinine 0.54, anion gap 7, beta chain quantitative S hCG to, white count 9000 with normal differential, hemoglobin 14, platelets 254,000, urinalysis unremarkable Medications and treatments: Normal saline bolus 1.5 L, fentanyl 25 g IV 1, Solu-Medrol 1 g IV 1  Review of Systems:  In addition to the HPI above,  No Fever-chills, myalgias or other constitutional symptoms No Headache, changes with Vision or hearing, tingling, numbness in any extremity, dizziness, dysarthria or word finding difficulty, gait disturbance or imbalance, tremors or seizure activity No problems swallowing food or Liquids, indigestion/reflux, choking or coughing while eating, abdominal pain with or after eating No Chest pain, Cough or Shortness of Breath, palpitations, orthopnea or DOE No Abdominal pain, N/V, melena,hematochezia, dark tarry stools, constipation No dysuria, malodorous urine,  hematuria or flank pain No new skin rashes, lesions, masses or bruises, No new joint pains, aches, swelling or redness No recent unintentional weight gain or loss No polyuria, polydypsia or polyphagia   Past Medical History:  Diagnosis Date  . Chronic pain syndrome   . Fibromyalgia   . GAD (generalized anxiety disorder)   . Headache   . Multiple sclerosis (HCC) 10/28/2014  . Obsessive compulsive disorder   . Vision abnormalities     History reviewed. No pertinent surgical history.  Social History   Social History  . Marital status: Married    Spouse name: N/A  . Number of children: N/A  . Years of education: N/A   Occupational History  . Not on file.   Social History Main Topics  . Smoking status: Current Every Day Smoker    Packs/day: 0.50    Types: Cigarettes  . Smokeless tobacco: Never Used  . Alcohol use No  . Drug use: No  . Sexual activity: Not on file   Other Topics Concern  . Not on file   Social History Narrative  . No narrative on file    Mobility: Independent Work history: Disabled   Allergies  Allergen Reactions  . Amoxicillin Anaphylaxis  . Penicillins Anaphylaxis  . Sulfa Antibiotics Other (See Comments)    unknown  . Codeine Nausea And Vomiting    Family History  Problem Relation Age of Onset  . Healthy Mother   . Diabetes type II Father   . Prostate cancer Father      Prior to Admission medications   Medication Sig Start Date End Date Taking? Authorizing Provider  amphetamine-dextroamphetamine (ADDERALL) 10 MG tablet Take 1 tablet (10 mg total) by mouth 2 (two) times daily with a meal. 02/10/17  Yes Sater,  Pearletha Furl, MD  baclofen (LIORESAL) 10 MG tablet Take 1 tablet (10 mg total) by mouth 3 (three) times daily. 08/07/15  Yes Sater, Pearletha Furl, MD  clonazePAM (KLONOPIN) 0.5 MG tablet Take 1 tablet (0.5 mg total) by mouth 3 (three) times daily as needed for anxiety. 10/17/16  Yes Sater, Pearletha Furl, MD  desvenlafaxine (PRISTIQ) 50 MG 24  hr tablet TAKE 1 TABLET(50 MG) BY MOUTH DAILY 02/20/17  Yes Sater, Pearletha Furl, MD  HYDROcodone-acetaminophen (NORCO) 10-325 MG tablet Take 1 tablet by mouth 2 (two) times daily as needed. 02/10/17  Yes Sater, Pearletha Furl, MD  lamoTRIgine (LAMICTAL) 150 MG tablet Take 1 tablet (150 mg total) by mouth 2 (two) times daily. 12/13/16  Yes Sater, Pearletha Furl, MD  megestrol (MEGACE) 40 MG tablet Take 1 tablet (40 mg total) by mouth daily. 10/17/16  Yes Sater, Pearletha Furl, MD  natalizumab (TYSABRI) 300 MG/15ML injection Inject 15 mLs (300 mg total) into the vein every 30 (thirty) days. 05/17/16  Yes Sater, Pearletha Furl, MD  methocarbamol (ROBAXIN) 500 MG tablet Take 1 tablet (500 mg total) by mouth 3 (three) times daily. Patient not taking: Reported on 02/21/2017 10/28/14   Sater, Pearletha Furl, MD  oxybutynin (DITROPAN) 5 MG tablet Take 1 tablet (5 mg total) by mouth 2 (two) times daily as needed for bladder spasms. Patient not taking: Reported on 02/21/2017 10/17/16   Asa Lente, MD    Physical Exam: Vitals:   02/21/17 0515 02/21/17 0630 02/21/17 0645 02/21/17 0715  BP: (!) 92/58 103/71 105/66 118/79  Pulse: 71 89 78 75  Resp:   16   Temp:      TempSrc:      SpO2: 100% 99% 99% 100%  Weight:    43.1 kg (95 lb)  Height:    5\' 4"  (1.626 m)      Constitutional: NAD, calm, comfortable-Appears pale and quite underweight Eyes: PERRL, lids and conjunctivae normal ENMT: Mucous membranes are moist. Posterior pharynx clear of any exudate or lesions.Normal dentition.  Neck: normal, supple, no masses, no thyromegaly Respiratory: clear to auscultation bilaterally, no wheezing, no crackles. Normal respiratory effort. No accessory muscle use.  Cardiovascular: Regular rate and rhythm, no murmurs / rubs / gallops. No extremity edema. 2+ pedal pulses. No carotid bruits.  Abdomen: no tenderness, no masses palpated. No hepatosplenomegaly. Bowel sounds positive.  Musculoskeletal: no clubbing / cyanosis. No joint deformity upper and  lower extremities. Good ROM, no contractures. Normal muscle tone.  Skin: no rashes, lesions, ulcers. No induration Neurologic: CN 2-12 grossly intact. Sensation intact, DTR hyporeflexive. Strength 5/5 x bilateral upper extremities and right lower extremity. Subtle 4+/5 strength left lower extremity Psychiatric: Normal judgment and insight. Alert and oriented x 3. Normal mood.    Labs on Admission: I have personally reviewed following labs and imaging studies  CBC:  Recent Labs Lab 02/21/17 0319  WBC 9.0  NEUTROABS 4.3  HGB 14.0  HCT 42.1  MCV 91.1  PLT 254   Basic Metabolic Panel:  Recent Labs Lab 02/21/17 0319  NA 139  K 3.7  CL 106  CO2 26  GLUCOSE 98  BUN 8  CREATININE 0.54  CALCIUM 9.2   GFR: Estimated Creatinine Clearance: 64.9 mL/min (by C-G formula based on SCr of 0.54 mg/dL). Liver Function Tests: No results for input(s): AST, ALT, ALKPHOS, BILITOT, PROT, ALBUMIN in the last 168 hours. No results for input(s): LIPASE, AMYLASE in the last 168 hours. No results for input(s): AMMONIA in the  last 168 hours. Coagulation Profile: No results for input(s): INR, PROTIME in the last 168 hours. Cardiac Enzymes: No results for input(s): CKTOTAL, CKMB, CKMBINDEX, TROPONINI in the last 168 hours. BNP (last 3 results) No results for input(s): PROBNP in the last 8760 hours. HbA1C: No results for input(s): HGBA1C in the last 72 hours. CBG: No results for input(s): GLUCAP in the last 168 hours. Lipid Profile: No results for input(s): CHOL, HDL, LDLCALC, TRIG, CHOLHDL, LDLDIRECT in the last 72 hours. Thyroid Function Tests: No results for input(s): TSH, T4TOTAL, FREET4, T3FREE, THYROIDAB in the last 72 hours. Anemia Panel: No results for input(s): VITAMINB12, FOLATE, FERRITIN, TIBC, IRON, RETICCTPCT in the last 72 hours. Urine analysis:    Component Value Date/Time   COLORURINE YELLOW 02/21/2017 0435   APPEARANCEUR CLEAR 02/21/2017 0435   LABSPEC 1.008 02/21/2017  0435   PHURINE 6.0 02/21/2017 0435   GLUCOSEU NEGATIVE 02/21/2017 0435   HGBUR NEGATIVE 02/21/2017 0435   BILIRUBINUR NEGATIVE 02/21/2017 0435   KETONESUR NEGATIVE 02/21/2017 0435   PROTEINUR NEGATIVE 02/21/2017 0435   NITRITE NEGATIVE 02/21/2017 0435   LEUKOCYTESUR NEGATIVE 02/21/2017 0435   Sepsis Labs: @LABRCNTIP (procalcitonin:4,lacticidven:4) )No results found for this or any previous visit (from the past 240 hour(s)).   Radiological Exams on Admission: No results found.   Assessment/Plan Principal Problem:   Multiple sclerosis exacerbation  -Patient presents with typical MS exacerbation symptoms -Management at the direction of the neurology team -Continue high-dose Solu-Medrol IV for total of 3 days minimum -Last MRI brain was July 2017 and showed stability without new demyelinating plaque -Has outpatient MRI scheduled for 8/27-ordered by Dr. Sater/Neurology  Active Problems:   Chronic pain/Fibromyalgia -Managed by outpatient neurologist -Continue preadmission baclofen and Norco -Obtain TSH, phosphorus and magnesium    Severe protein-calorie malnutrition  -BMI equals 16 -Continue preadmission Megace -Nutrition consultation    OCD (obsessive compulsive disorder)/Generalized anxiety disorder -Continue preadmission Klonopin, Lamictal, and Effexor       DVT prophylaxis: Lovenox Code Status: Full  Family Communication: Family at bedside  Disposition Plan: Home Consults called: Neurology/[place]    ELLIS,ALLISON L. ANP-BC [facility] Pager [phone]   If 7PM-7AM, please contact night-coverage [email or web address] Password [email or web address]  02/21/2017, 8:09 AM

## 2017-02-21 NOTE — ED Notes (Signed)
Attempted Report 

## 2017-02-22 DIAGNOSIS — M797 Fibromyalgia: Secondary | ICD-10-CM

## 2017-02-22 DIAGNOSIS — G35 Multiple sclerosis: Principal | ICD-10-CM

## 2017-02-22 DIAGNOSIS — E876 Hypokalemia: Secondary | ICD-10-CM

## 2017-02-22 DIAGNOSIS — F429 Obsessive-compulsive disorder, unspecified: Secondary | ICD-10-CM

## 2017-02-22 DIAGNOSIS — D72829 Elevated white blood cell count, unspecified: Secondary | ICD-10-CM

## 2017-02-22 DIAGNOSIS — E43 Unspecified severe protein-calorie malnutrition: Secondary | ICD-10-CM

## 2017-02-22 DIAGNOSIS — F411 Generalized anxiety disorder: Secondary | ICD-10-CM

## 2017-02-22 DIAGNOSIS — G8929 Other chronic pain: Secondary | ICD-10-CM

## 2017-02-22 LAB — COMPREHENSIVE METABOLIC PANEL
ALK PHOS: 57 U/L (ref 38–126)
ALT: 11 U/L — ABNORMAL LOW (ref 14–54)
ANION GAP: 6 (ref 5–15)
AST: 19 U/L (ref 15–41)
Albumin: 3.5 g/dL (ref 3.5–5.0)
BILIRUBIN TOTAL: 0.7 mg/dL (ref 0.3–1.2)
BUN: 6 mg/dL (ref 6–20)
CALCIUM: 9 mg/dL (ref 8.9–10.3)
CO2: 25 mmol/L (ref 22–32)
Chloride: 112 mmol/L — ABNORMAL HIGH (ref 101–111)
Creatinine, Ser: 0.56 mg/dL (ref 0.44–1.00)
Glucose, Bld: 138 mg/dL — ABNORMAL HIGH (ref 65–99)
Potassium: 3.3 mmol/L — ABNORMAL LOW (ref 3.5–5.1)
SODIUM: 143 mmol/L (ref 135–145)
Total Protein: 5.7 g/dL — ABNORMAL LOW (ref 6.5–8.1)

## 2017-02-22 LAB — CBC
HCT: 38.6 % (ref 36.0–46.0)
Hemoglobin: 12.5 g/dL (ref 12.0–15.0)
MCH: 29.3 pg (ref 26.0–34.0)
MCHC: 32.4 g/dL (ref 30.0–36.0)
MCV: 90.4 fL (ref 78.0–100.0)
PLATELETS: 262 10*3/uL (ref 150–400)
RBC: 4.27 MIL/uL (ref 3.87–5.11)
RDW: 12.4 % (ref 11.5–15.5)
WBC: 16 10*3/uL — AB (ref 4.0–10.5)

## 2017-02-22 MED ORDER — ENSURE ENLIVE PO LIQD
237.0000 mL | Freq: Two times a day (BID) | ORAL | Status: DC
  Administered 2017-02-22: 237 mL via ORAL
  Filled 2017-02-22 (×5): qty 237

## 2017-02-22 MED ORDER — PANTOPRAZOLE SODIUM 40 MG PO TBEC
40.0000 mg | DELAYED_RELEASE_TABLET | Freq: Two times a day (BID) | ORAL | Status: DC
  Administered 2017-02-22 – 2017-02-23 (×3): 40 mg via ORAL
  Filled 2017-02-22 (×3): qty 1

## 2017-02-22 MED ORDER — POTASSIUM CHLORIDE 20 MEQ PO PACK
40.0000 meq | PACK | Freq: Two times a day (BID) | ORAL | Status: DC
  Administered 2017-02-22 (×2): 40 meq via ORAL
  Filled 2017-02-22 (×3): qty 2

## 2017-02-22 MED ORDER — GADOBENATE DIMEGLUMINE 529 MG/ML IV SOLN
10.0000 mL | Freq: Once | INTRAVENOUS | Status: AC | PRN
  Administered 2017-02-22: 9 mL via INTRAVENOUS

## 2017-02-22 NOTE — Telephone Encounter (Signed)
LMOM for Rosey Bath to call with update on pt. when she is able/fim

## 2017-02-22 NOTE — Care Management Note (Signed)
Case Management Note  Patient Details  Name: Sabrina [name] MRN: [id number] Date of Birth: 1980/08/17  Subjective/Objective:   Pt admitted with MS. She is from home with her spouse.                  Action/Plan: Awaiting PT/OT recommendations. CM following for d/c needs, physician orders.   Expected Discharge Date:                  Expected Discharge Plan:     In-House Referral:     Discharge planning Services     Post Acute Care Choice:    Choice offered to:     DME Arranged:    DME Agency:     HH Arranged:    HH Agency:     Status of Service:  In process, will continue to follow  If discussed at Long Length of Stay Meetings, dates discussed:    Additional Comments:  Kermit Balo, RN 02/22/2017, 11:35 AM

## 2017-02-22 NOTE — Progress Notes (Addendum)
Initial Nutrition Assessment  DOCUMENTATION CODES:   Severe malnutrition in context of chronic illness, Underweight  INTERVENTION:    Ensure Enlive po BID, each supplement provides 350 kcal and 20 grams of protein  NUTRITION DIAGNOSIS:   Malnutrition (severe) related to chronic illness (multiple sclerosis, chronic pain syndrome) as evidenced by severe depletion of body fat, severe depletion of muscle mass  GOAL:   Patient will meet greater than or equal to 90% of their needs  MONITOR:   PO intake, Supplement acceptance, Labs, Weight trends, Skin, I & O's  REASON FOR ASSESSMENT:   Consult Assessment of nutrition requirement/status  ASSESSMENT:   36 y.o. Female with medical history significant for relapsing-remitting multiple sclerosis, OCD, generalized anxiety disorder, fibromyalgia and chronic pain syndrome. She presents to the ER complaining of one week of progressive bilateral leg weakness, "cloudy headedness", migratory visual blurriness. She also reported nocturnal urinary incontinence that awakens her from sleep. She states these symptoms are typical of her previous MS exacerbations. She's been evaluated by neurology since arrival and recommendations been made regarding treatment.  Pt reports her appetite has been poor today bc "it's been a bad day." Mom at bedside. Reports pt's weight has been stable. Mom states pt has been trying to gain weight for the last year or so.  Pt amenable to trying Ensure Enlive during hospitalization. Medications reviewed and include Megace. Labs reviewed. K 3.3 (L). Ch 112 (H).  Diet recall includes: Breakfast >> [employer] Lunch >> none Dinner >> meat and starch  HS snack >> ice cream or peanut butter crackers  Nutrition-Focused physical exam completed.  Findings are severe fat depletion, severe muscle depletion, and no edema.  Diet Order:  Diet regular Room service appropriate? Yes; Fluid consistency:  Thin  Skin:  Reviewed, no issues  Last BM:  8/13  Height:   Ht Readings from Last 1 Encounters:  02/21/17 5\' 4"  (1.626 m)   Weight:   Wt Readings from Last 1 Encounters:  02/21/17 95 lb (43.1 kg)   Wt Readings from Last 10 Encounters:  02/21/17 95 lb (43.1 kg)  12/13/16 95 lb (43.1 kg)  06/15/16 100 lb (45.4 kg)  04/05/16 98 lb (44.5 kg)  10/19/15 98 lb (44.5 kg)  08/24/15 103 lb 3.2 oz (46.8 kg)  08/07/15 105 lb (47.6 kg)  07/14/15 105 lb 9.6 oz (47.9 kg)  06/11/15 109 lb (49.4 kg)  01/27/15 111 lb (50.3 kg)   Ideal Body Weight:  54.5 kg  BMI:  Body mass index is 16.31 kg/m.  Estimated Nutritional Needs:   Kcal:  1300-1500  Protein:  65-80 gm  Fluid:  >/= 1.5 L  EDUCATION NEEDS:   No education needs identified at this time  Maureen Chatters, RD, LDN Pager #: [phone] After-Hours Pager #: [phone]

## 2017-02-22 NOTE — Progress Notes (Signed)
PROGRESS NOTE    Sabrina [name]  ZOX:[id number] DOB: 22-Mar-1981 DOA: 02/21/2017 PCP: Lorelei Pont, DO   Brief Narrative:  Sabrina [name] is a 36 y.o. female with medical history significant for relapsing-remitting multiple sclerosis, OCD, generalized anxiety disorder, fibromyalgia and chronic pain syndrome and other comorbids who presented to the ER complaining of one week of progressive bilateral leg weakness, "cloudy headedness", migratory visual blurriness. She also reported nocturnal urinary incontinence that awakens her from sleep. She states these symptoms are typical of her previous MS exacerbations. She underwent an MRI of Head and Thoracic Spine and MRI of head showed No acute intracranial process. There was stable severe chronic demyelination with similar mild hazy supratentorial enhancement. No new lesions. Stable mild parenchymal brain volume loss for age. The MRI of the Thoracic spine showed Numerous demyelinating plaques throughout the thoracic spinal cord without enhancement. Considering mild expansile appearance at T6-7 suggesting acute lesion. No myelomalacia   Assessment & Plan:   Principal Problem:   Multiple sclerosis exacerbation (HCC) Active Problems:   OCD (obsessive compulsive disorder)   Generalized anxiety disorder   Chronic pain   Fibromyalgia   Severe protein-calorie malnutrition (HCC)   Leukocytosis   Hypokalemia   Multiple sclerosis exacerbation  -Patient presents with typical MS exacerbation symptoms -Management at the direction of the Neurology team -Continue high-dose Solu-Medrol IV 1000 mg Daily for total of 3 days minimum (possibly 5); Today was Day 2 -Added Pantoprazole 40 mg po BID -MRI of Brain showed: No acute intracranial process.  Stable severe chronic demyelination with similar mild hazy supratentorial enhancement. No new lesions. Stable mild parenchymal brain volume loss for age. -MRI of the Thoracic Spine showed: Numerous demyelinating plaques  throughout the thoracic spinal cord without enhancement. Considering mild expansile appearance at T6-7 suggesting acute lesion. No myelomalacia -Last MRI brain was July 2017 and showed stability without new demyelinating plaque -Has outpatient MRI scheduled for 8/27-ordered by Dr. Sater/Neurology -Appreciated Neuro Recc's -PT/OT Consulted and PT recommending Home Health PT with a 3 in 1 Bedside Commode   Chronic pain/Fibromyalgia -Managed by outpatient Neurologist -Continue preadmission Baclofen 10 mg TID and Norco 1 tablet po BIDprn Severe Pain -C/w Venlafaxine 75 mg po Daily  -Obtain TSH, phosphorus and magnesium  Severe Protein-calorie Malnutrition  -BMI equals 16 -Continue preadmission Megace 400 mg po Daily -Nutrition consultation appreciated -C/w Ensure Enlive po BID   Hypokalemia -Patient's K+ level this AM was 3.3 -Replete with po KCl 40 mEQ po BID -Continue to Monitor and Replete as Necessary -Repeat CMP in AM   OCD (obsessive compulsive disorder)/Generalized Anxiety Disorder/ Fibromyalgia -Continue preadmission Clonazepam 0.5 mg po TIDprn, Lamotrigine 150 mg po BID, and Venlafaxine 75 mg po Daily  -C/w Adderall 10 mg po BID  Leukocytosis -WBC went from 9.0 -> 16.0 -Likely from IV Steroid Demargination -Continue to Monitor for S/Sx of Infection -Repeat CBC in AM   DVT prophylaxis: Enoxaparin 30 mg sq q24h Code Status: FULL CODE Family Communication: Discussed with Family at bedside Disposition Plan: Remain Inpatient Currently  Consultants:   Neurology  Procedures:   None  Antimicrobials:  Anti-infectives    None     Subjective: Seen and examined and was in a lot of pain and felt nauseous. No CP or SOB. Thinks weakness is improving slightly. No other concerns or complaints at this time.   Objective: Vitals:   02/22/17 0527 02/22/17 0901 02/22/17 1309 02/22/17 1636  BP: 107/72 107/70 108/61 (!) 100/58  Pulse: 81 82 94 97  Resp: 18 18 18 18   Temp:  98.9 F (37.2 C) 98 F (36.7 C) 98.6 F (37 C) 98.6 F (37 C)  TempSrc: Oral Oral Oral Oral  SpO2: 99% 99% 100% 99%  Weight:      Height:        Intake/Output Summary (Last 24 hours) at 02/22/17 1851 Last data filed at 02/22/17 1753  Gross per 24 hour  Intake          1602.17 ml  Output                0 ml  Net          1602.17 ml   Filed Weights   02/21/17 0715  Weight: 43.1 kg (95 lb)   Examination: Physical Exam:  Constitutional: Thin cachectic appearing Caucasian female in NAD and appears calm and comfortable Eyes: Lids and conjunctivae normal, sclerae anicteric  ENMT: External Ears, Nose appear normal. Grossly normal hearing. Mucous membranes are moist.  Neck: Appears normal, supple, no cervical masses, normal ROM, no appreciable thyromegaly, no JVD Respiratory: Clear to auscultation bilaterally, no wheezing, rales, rhonchi or crackles. Normal respiratory effort and patient is not tachypenic. No accessory muscle use.  Cardiovascular: RRR, no murmurs / rubs / gallops. S1 and S2 auscultated. No extremity edema. Abdomen: Soft, non-tender, non-distended. No masses palpated. No appreciable hepatosplenomegaly. Bowel sounds positive.  GU: Deferred. Musculoskeletal: No clubbing / cyanosis of digits/nails. No joint deformity upper and lower extremities. Good ROM, no contractures.  Skin: No rashes, lesions, ulcers on limited skin eval. No induration; Warm and dry.  Neurologic: CN 2-12 grossly intact with no focal deficits. Sensation intact in all 4 Extremities, Strength 4/5 in LE. Romberg sign cerebellar reflexes not assessed.  Psychiatric: Normal judgment and insight. Alert and oriented x 3. Normal mood and appropriate affect.   Data Reviewed: I have personally reviewed following labs and imaging studies  CBC:  Recent Labs Lab 02/21/17 0319 02/22/17 0523  WBC 9.0 16.0*  NEUTROABS 4.3  --   HGB 14.0 12.5  HCT 42.1 38.6  MCV 91.1 90.4  PLT 254 262   Basic Metabolic  Panel:  Recent Labs Lab 02/21/17 0319 02/21/17 0740 02/22/17 0523  NA 139  --  143  K 3.7  --  3.3*  CL 106  --  112*  CO2 26  --  25  GLUCOSE 98  --  138*  BUN 8  --  6  CREATININE 0.54  --  0.56  CALCIUM 9.2  --  9.0  MG  --  1.9  --   PHOS  --  2.9  --    GFR: Estimated Creatinine Clearance: 64.9 mL/min (by C-G formula based on SCr of 0.56 mg/dL). Liver Function Tests:  Recent Labs Lab 02/22/17 0523  AST 19  ALT 11*  ALKPHOS 57  BILITOT 0.7  PROT 5.7*  ALBUMIN 3.5   No results for input(s): LIPASE, AMYLASE in the last 168 hours. No results for input(s): AMMONIA in the last 168 hours. Coagulation Profile: No results for input(s): INR, PROTIME in the last 168 hours. Cardiac Enzymes: No results for input(s): CKTOTAL, CKMB, CKMBINDEX, TROPONINI in the last 168 hours. BNP (last 3 results) No results for input(s): PROBNP in the last 8760 hours. HbA1C: No results for input(s): HGBA1C in the last 72 hours. CBG: No results for input(s): GLUCAP in the last 168 hours. Lipid Profile: No results for input(s): CHOL, HDL, LDLCALC, TRIG, CHOLHDL, LDLDIRECT in the last 72  hours. Thyroid Function Tests:  Recent Labs  02/21/17 0740  TSH 1.239   Anemia Panel: No results for input(s): VITAMINB12, FOLATE, FERRITIN, TIBC, IRON, RETICCTPCT in the last 72 hours. Sepsis Labs: No results for input(s): PROCALCITON, LATICACIDVEN in the last 168 hours.  No results found for this or any previous visit (from the past 240 hour(s)).   Radiology Studies: Mr Laqueta Jean Wo Contrast  Result Date: 02/22/2017 CLINICAL DATA:  One-week of progressive bilateral leg weakness, visual changes, nocturnal incontinence and cloudy headed ; symptoms associated with prior MS exacerbation. Relapsing remitting multiple sclerosis, chronic pain syndrome. EXAM: MRI HEAD WITHOUT AND WITH CONTRAST TECHNIQUE: Multiplanar, multiecho pulse sequences of the brain and surrounding structures were obtained without and  with intravenous contrast. CONTRAST:  9mL MULTIHANCE GADOBENATE DIMEGLUMINE 529 MG/ML IV SOLN COMPARISON:  MRI of the head January 17, 2016 an MRI of the cervical spine August 17, 2015 FINDINGS: BRAIN: Patchy expansile T2 bright signal within the ventral cervicomedullary junction. Subcentimeter LEFT cerebellar, LEFT brachium pontis and pons are unchanged. Far greater than 10 supratentorial white matter lesions including confluent periventricular white matter lesions and, juxta cortical lesions. Many lesions radiate from the periventricular margin with low T1 signal compatible with black holes of demyelination. Bilateral thalamus/ internal capsule lesions radiating to the cerebral peduncle. Similar hazy enhancement supratentorial white matter, and around multiple white matter lesions. No reduced diffusion to suggest acute ischemia or hyperacute demyelination. No susceptibility artifact to suggest hemorrhage. Mild prominence of ventricles and sulci for patient's age. No masses or mass effect. No abnormal extra-axial fluid collections or abnormal extra-axial enhancement. 10 mm pineal cyst. VASCULAR: Normal major intracranial vascular flow voids present at skull base. SKULL AND UPPER CERVICAL SPINE: No abnormal sellar expansion. No suspicious calvarial bone marrow signal. Craniocervical junction maintained. SINUSES/ORBITS: The mastoid air-cells and included paranasal sinuses are well-aerated. The included ocular globes and orbital contents are non-suspicious. OTHER: None. IMPRESSION: 1. No acute intracranial process. 2. Stable severe chronic demyelination with similar mild hazy supratentorial enhancement. No new lesions. 3. Stable mild parenchymal brain volume loss for age. Electronically Signed   By: Awilda Metro M.D.   On: 02/22/2017 03:11   Mr Thoracic Spine W Wo Contrast  Result Date: 02/22/2017 CLINICAL DATA:  One-week of progressive bilateral leg weakness, visual changes, nocturnal incontinence and cloudy  headed ; symptoms associated with prior MS exacerbation. Relapsing remitting multiple sclerosis, chronic pain syndrome EXAM: MRI THORACIC WITHOUT AND WITH CONTRAST TECHNIQUE: Multiplanar and multiecho pulse sequences of the thoracic spine were obtained without and with intravenous contrast. CONTRAST:  9mL MULTIHANCE GADOBENATE DIMEGLUMINE 529 MG/ML IV SOLN COMPARISON:  None. FINDINGS: ALIGNMENT: Maintenance of the thoracic kyphosis. No malalignment. VERTEBRAE/DISCS: Vertebral bodies are intact. Intervertebral discs morphology and signal are normal. No abnormal or acute bone marrow signal. No abnormal osseous or disc enhancement. CORD: Thoracic spinal cord is normal morphology. Patchy abnormal T2 bright signal throughout the spinal cord, mildly expansile at T6-7. No convincing evidence of spinal cord enhancement though, pulsation artifact limits assessment on axial sequences. No abnormal epidural or leptomeningeal enhancement. PREVERTEBRAL AND PARASPINAL SOFT TISSUES:  Normal. DISC LEVELS: No disc bulge, canal stenosis or neural foraminal narrowing at any level. IMPRESSION: 1. Numerous demyelinating plaques throughout the thoracic spinal cord without enhancement. Considering mild expansile appearance at T6-7 suggesting acute lesion. No myelomalacia. Electronically Signed   By: Awilda Metro M.D.   On: 02/22/2017 03:17   Scheduled Meds: . amphetamine-dextroamphetamine  10 mg Oral BID WC  . baclofen  10 mg Oral TID  . enoxaparin (LOVENOX) injection  30 mg Subcutaneous Q24H  . feeding supplement (ENSURE ENLIVE)  237 mL Oral BID BM  . lamoTRIgine  150 mg Oral BID  . megestrol  400 mg Oral Daily  . pantoprazole  40 mg Oral BID  . potassium chloride  40 mEq Oral BID  . venlafaxine XR  75 mg Oral Q breakfast   Continuous Infusions: . sodium chloride 50 mL/hr at 02/22/17 1113  . methylPREDNISolone (SOLU-MEDROL) injection Stopped (02/22/17 0931)    LOS: 1 day   Merlene Laughter, DO [facility] Pager [phone]  If 7PM-7AM, please contact night-coverage [email or web address] Password [facility] 02/22/2017, 6:51 PM

## 2017-02-22 NOTE — Evaluation (Signed)
Physical Therapy Evaluation Patient Details Name: Sabrina [name] MRN: [id number] DOB: 1980/07/29 Today's Date: 02/22/2017   History of Present Illness  36 y.o. female with medical history significant for relapsing-remitting multiple sclerosis, OCD, generalized anxiety disorder, fibromyalgia and chronic pain syndrome  Clinical Impression  Orders received for PT evaluation. Patient demonstrates deficits in functional mobility as indicated below. Will benefit from continued skilled PT to address deficits and maximize function. Will see as indicated and progress as tolerated.  Spoke with patient and family at length regarding strategies for management of energy conservation in setting of MS. Family express concerns regarding patients ability to perform basic ADLs and hygiene as well. At this time feel patient would benefit tremendously for home health follow up as well as an home health aide.  Recommend OT consult for patient. Will continue to see and progress as tolerated. Patient and family very receptive and appreciative.     Follow Up Recommendations Home health PT;Supervision for mobility/OOB (home health aide )    Equipment Recommendations  3in1 (PT)    Recommendations for Other Services OT consult     Precautions / Restrictions Precautions Precautions: Fall Restrictions Weight Bearing Restrictions: No      Mobility  Bed Mobility Overal bed mobility: Modified Independent             General bed mobility comments: increased time to perform  Transfers Overall transfer level: Needs assistance Equipment used: 1 person hand held assist Transfers: Sit to/from Stand Sit to Stand: Min assist         General transfer comment: Min assist for stability during elevation to upright  Ambulation/Gait Ambulation/Gait assistance: Min guard Ambulation Distance (Feet): 18 Feet Assistive device:  (pushing IV pole) Gait Pattern/deviations: Step-through pattern;Decreased stride  length;Drifts right/left;Trunk flexed;Narrow base of support Gait velocity: decreased Gait velocity interpretation: Below normal speed for age/gender General Gait Details: patient with noted instability during ambulation, atleerred gait pattern with noted LLE weakness impeding functional stride.   Stairs            Wheelchair Mobility    Modified Rankin (Stroke Patients Only)       Balance Overall balance assessment: Needs assistance Sitting-balance support: Feet supported Sitting balance-Leahy Scale: Good       Standing balance-Leahy Scale: Fair                               Pertinent Vitals/Pain Pain Assessment: Faces Faces Pain Scale: Hurts little more Pain Location: Back and LE hip/leg pain Pain Descriptors / Indicators: Aching;Guarding;Spasm Pain Intervention(s): Monitored during session;Repositioned    Home Living Family/patient expects to be discharged to:: Private residence Living Arrangements: Spouse/significant other;Other relatives Available Help at Discharge: Family Type of Home: House Home Access: Level entry     Home Layout: One level Home Equipment: Environmental consultant - 2 wheels      Prior Function Level of Independence: Needs assistance   Gait / Transfers Assistance Needed: independent with mobility     Comments: per mom and patient, patient has required assist for basic ADLS due to extreme fatigue and has not been performing basic self care and hygiene tasks at this time     Hand Dominance   Dominant Hand: Right    Extremity/Trunk Assessment   Upper Extremity Assessment Upper Extremity Assessment: Generalized weakness    Lower Extremity Assessment Lower Extremity Assessment: Generalized weakness;RLE deficits/detail;LLE deficits/detail RLE Deficits / Details: 4+/5 gross motions RLE Sensation: history of  peripheral neuropathy LLE Deficits / Details: 4/5 strength gross motions LLE Sensation: history of peripheral neuropathy LLE  Coordination: decreased fine motor;decreased gross motor       Communication   Communication: No difficulties  Cognition Arousal/Alertness: Awake/alert Behavior During Therapy: WFL for tasks assessed/performed Overall Cognitive Status: Within Functional Limits for tasks assessed                                        General Comments      Exercises     Assessment/Plan    PT Assessment Patient needs continued PT services  PT Problem List Decreased strength;Decreased activity tolerance;Decreased balance;Decreased mobility;Decreased coordination;Decreased safety awareness;Pain       PT Treatment Interventions DME instruction;Gait training;Stair training;Functional mobility training;Therapeutic activities;Therapeutic exercise;Balance training;Patient/family education    PT Goals (Current goals can be found in the Care Plan section)  Acute Rehab PT Goals Patient Stated Goal: to go home PT Goal Formulation: With patient/family Time For Goal Achievement: 03/08/17 Potential to Achieve Goals: Good    Frequency Min 3X/week   Barriers to discharge        Co-evaluation               AM-PAC PT "6 Clicks" Daily Activity  Outcome Measure Difficulty turning over in bed (including adjusting bedclothes, sheets and blankets)?: A Little Difficulty moving from lying on back to sitting on the side of the bed? : A Little Difficulty sitting down on and standing up from a chair with arms (e.g., wheelchair, bedside commode, etc,.)?: Total Help needed moving to and from a bed to chair (including a wheelchair)?: A Little Help needed walking in hospital room?: A Little Help needed climbing 3-5 steps with a railing? : A Lot 6 Click Score: 15    End of Session Equipment Utilized During Treatment: Gait belt Activity Tolerance: Patient limited by fatigue Patient left: in bed;with call bell/phone within reach;with bed alarm set;with family/visitor present Nurse  Communication: Mobility status PT Visit Diagnosis: Other abnormalities of gait and mobility (R26.89);Muscle weakness (generalized) (M62.81);Ataxic gait (R26.0);Difficulty in walking, not elsewhere classified (R26.2)    Time: 8101-7510 PT Time Calculation (min) (ACUTE ONLY): 27 min   Charges:   PT Evaluation $PT Eval Moderate Complexity: 1 Mod PT Treatments $Self Care/Home Management: 8-22   PT G Codes:        Charlotte Crumb, PT DPT  Board Certified Neurologic Specialist [phone]   Fabio Asa 02/22/2017, 3:37 PM

## 2017-02-22 NOTE — Progress Notes (Signed)
Subjective: Feeling stronger today and walking to the bathroom.   Exam: Vitals:   02/22/17 0527 02/22/17 0901  BP: 107/72 107/70  Pulse: 81 82  Resp: 18 18  Temp: 98.9 F (37.2 C) 98 F (36.7 C)  SpO2: 99% 99%    HEENT-  Normocephalic, no lesions, without obvious abnormality.  Normal external eye and conjunctiva.  Normal TM's bilaterally.  Normal auditory canals and external ears. Normal external nose, mucus membranes and septum.  Normal pharynx.    Neuro:  CN: Pupils are equal and round. They are symmetrically reactive from 3-->2 mm. EOMI without nystagmus. Facial sensation is intact to light touch. Face is symmetric at rest with normal strength and mobility. Hearing is intact to conversational voice. Palate elevates symmetrically and uvula is midline. Voice is normal in tone, pitch and quality. Bilateral SCM and trapezii are 5/5. Tongue is midline with normal bulk and mobility.  Motor: Right :  Upper extremity   4+/5                                                Left:     Upper extremity   4+/5             Lower extremity   4+5/5                                              Lower extremity   4-/5 Increased tone bilateral lower extremities. Sensory: Temp and light touch intact x 4. No extinction. Deep Tendon Reflexes: Hyperactive x 4. Right toe upgoing    Pertinent Labs/Diagnostics: MRI brain IMPRESSION: 1. No acute intracranial process. 2. Stable severe chronic demyelination with similar mild hazy supratentorial enhancement. No new lesions. 3. Stable mild parenchymal brain volume loss for age    MRI Thoracic IMPRESSION: 1. Numerous demyelinating plaques throughout the thoracic spinal cord without enhancement. Considering mild expansile appearance at T6-7 suggesting acute lesion. No myelomalacia   Felicie Morn PA-C [facility] [phone]  Impression: MS Exacerbation   Recommendations: 1) IV solumedrol 1000 mg qd x 3 days.--one more dose--may need 5  if PT feels no large improvement. 2) PT and OT consult   02/22/2017, 12:24 PM

## 2017-02-23 ENCOUNTER — Telehealth: Payer: Self-pay | Admitting: Neurology

## 2017-02-23 LAB — CBC WITH DIFFERENTIAL/PLATELET
BASOS ABS: 0 10*3/uL (ref 0.0–0.1)
BASOS PCT: 0 %
Eosinophils Absolute: 0 10*3/uL (ref 0.0–0.7)
Eosinophils Relative: 0 %
HEMATOCRIT: 36.4 % (ref 36.0–46.0)
Hemoglobin: 12 g/dL (ref 12.0–15.0)
Lymphocytes Relative: 13 %
Lymphs Abs: 2.4 10*3/uL (ref 0.7–4.0)
MCH: 30.8 pg (ref 26.0–34.0)
MCHC: 33 g/dL (ref 30.0–36.0)
MCV: 93.6 fL (ref 78.0–100.0)
MONO ABS: 0.7 10*3/uL (ref 0.1–1.0)
Monocytes Relative: 4 %
NEUTROS ABS: 15.2 10*3/uL — AB (ref 1.7–7.7)
NEUTROS PCT: 83 %
PLATELETS: 239 10*3/uL (ref 150–400)
RBC: 3.89 MIL/uL (ref 3.87–5.11)
RDW: 13.3 % (ref 11.5–15.5)
WBC: 18.3 10*3/uL — ABNORMAL HIGH (ref 4.0–10.5)

## 2017-02-23 LAB — COMPREHENSIVE METABOLIC PANEL
ALBUMIN: 3.3 g/dL — AB (ref 3.5–5.0)
ALK PHOS: 50 U/L (ref 38–126)
ALT: 13 U/L — AB (ref 14–54)
ANION GAP: 3 — AB (ref 5–15)
AST: 21 U/L (ref 15–41)
BUN: 11 mg/dL (ref 6–20)
CALCIUM: 9 mg/dL (ref 8.9–10.3)
CHLORIDE: 114 mmol/L — AB (ref 101–111)
CO2: 27 mmol/L (ref 22–32)
Creatinine, Ser: 0.6 mg/dL (ref 0.44–1.00)
GFR calc Af Amer: >60 mL/min (ref >60)
GFR calc non Af Amer: >60 mL/min (ref >60)
GLUCOSE: 138 mg/dL — AB (ref 65–99)
Potassium: 4.7 mmol/L (ref 3.5–5.1)
SODIUM: 144 mmol/L (ref 135–145)
Total Bilirubin: 0.6 mg/dL (ref 0.3–1.2)
Total Protein: 5.4 g/dL — ABNORMAL LOW (ref 6.5–8.1)

## 2017-02-23 LAB — MAGNESIUM: Magnesium: 2 mg/dL (ref 1.7–2.4)

## 2017-02-23 LAB — PHOSPHORUS: Phosphorus: 2.8 mg/dL (ref 2.5–4.6)

## 2017-02-23 MED ORDER — ENSURE ENLIVE PO LIQD: 237.0000 mL | Freq: Two times a day (BID) | ORAL | 12 refills | Status: AC

## 2017-02-23 MED ORDER — SENNA 8.6 MG PO TABS: 1.0000 | ORAL_TABLET | Freq: Every day | ORAL | Status: DC

## 2017-02-23 MED ORDER — SENNA 8.6 MG PO TABS: 1.0000 | ORAL_TABLET | Freq: Every day | ORAL | 0 refills | Status: DC

## 2017-02-23 MED ORDER — PANTOPRAZOLE SODIUM 40 MG PO TBEC: 40.0000 mg | DELAYED_RELEASE_TABLET | Freq: Two times a day (BID) | ORAL | 0 refills | Status: DC

## 2017-02-23 NOTE — Telephone Encounter (Signed)
Pt mother has called to inform that pt is @ [facility] and that she has an active lesion, she just wanted the update provided and welcomes a call back if further information is needed from her.  No call back requested

## 2017-02-23 NOTE — Telephone Encounter (Signed)
I have spoken with Sabrina [name] this am.  Sabrina [name] is being d/c from [facility] today.  Appt. given with RAS 02/28/17 to discuss MRI changes/fim

## 2017-02-23 NOTE — Telephone Encounter (Signed)
I have spoken with Dr. Laurence Slate and explained that I spoke with Nyliah's mom and gave appt. with RAS on 02/28/17/fim

## 2017-02-23 NOTE — Discharge Summary (Signed)
Physician Discharge Summary  Sabrina [name] ZOX:[id number] DOB: October 01, 1980 DOA: 02/21/2017  PCP: Lorelei Pont, DO  Admit date: 02/21/2017 Discharge date: 02/23/2017  Admitted From: Home Disposition:  Home with Home Health PT/OT/Aide/RN  Recommendations for Outpatient Follow-up:  1. Follow up with and establish with PCP in 1-2 weeks 2. Follow up with Neurology Dr. Epimenio Foot on 02/28/17 3. Please obtain CMP/CBC, Mag, Phos in one week  Home Health: YES Equipment/Devices: 3-in-1   Discharge Condition: Stable CODE STATUS: FULL CODE Diet recommendation: Regular Diet  Brief/Interim Summary: Sabrina [name] a 36 y.o.femalewith medical history significant for relapsing-remitting multiple sclerosis, OCD, generalized anxiety disorder, fibromyalgia and chronic pain syndrome and other comorbids who presented to the ER complaining of one week ofprogressive bilateral leg weakness, "cloudy headedness", migratory visual blurriness. She also reported nocturnal urinary incontinence that awakens her from sleep. She states these symptoms are typical of her previous MS exacerbations. She underwent an MRI of Head and Thoracic Spine and MRI of head showed No acute intracranial process. There was stable severe chronic demyelination with similar mild hazy supratentorial enhancement. No new lesions. Stable mild parenchymal brain volume loss for age. The MRI of the Thoracic spine showed Numerous demyelinating plaques throughout the thoracic spinal cord without enhancement. Considering mild expansile appearance at T6-7 suggesting acute lesion. No myelomalacia. She was admitted for a MS flare and Neurology was consulted. Patient was placed on High Dose IV Steroids for 3 days and improved. PT/OT was consulted and recommended Home Health PT/OT/RN/Aide. She was deemed medically stable to be D/C'd Home as she improved and will need to follow up with PCP and with Neurology as an outpatient to have follow up labwork done.    Discharge Diagnoses:  Principal Problem:   Multiple sclerosis exacerbation (HCC) Active Problems:   OCD (obsessive compulsive disorder)   Generalized anxiety disorder   Chronic pain   Fibromyalgia   Severe protein-calorie malnutrition (HCC)   Leukocytosis   Hypokalemia  Multiple Sclerosis Exacerbation, improving -Patient presented with typical MS exacerbation symptoms -Management at the direction of the Neurology team -Completed high-dose Solu-Medrol IV 1000 mg Daily for total of 3 days  -Added Pantoprazole 40 mg po BID and will D/C Home -MRI of Brain showed: No acute intracranial process.  Stable severe chronic demyelination with similar mild hazy supratentorial enhancement. No new lesions. Stable mild parenchymal brain volume loss for age. -MRI of the Thoracic Spine showed: Numerous demyelinating plaques throughout the thoracic spinal cord without enhancement. Considering mild expansile appearance at T6-7 suggesting acute lesion. No myelomalacia -Last MRI brain was July 2017 and showed stability without new demyelinating plaque -Has outpatient MRI scheduled for 8/27-ordered by Dr. Sater/Neurology -Appreciated Neuro Recc's -PT/OT Consulted and recommending Home Health PT/OT/Aide with a 3 in 1 Bedside Commode  -Follow up with Neurology on 02/28/17 with Dr. Epimenio Foot   Chronic pain/Fibromyalgia -Managed by outpatient Neurologist and will need to follow up with Dr. Epimenio Foot -Continue preadmission Baclofen 10 mg TID and Norco 1 tablet po BIDprn Severe Pain -C/w Venlafaxine 75 mg po Daily  -Obtained TSH and was 1.239, Phosphorus and magnesium were WNL  Severe Protein-calorie Malnutrition  -BMI equals 16 -Continue preadmission Megace 400 mg po Daily -Nutrition consultation appreciated -C/w Ensure Enlive po BID and will send home with D/C  Hypokalemia, improved -Patient's K+ level this AM was 3.3 -> 4.7 -Replete with po KCl 40 mEQ po BID yesterday -Continue to Monitor and Replete as  Necessary -Repeat CMP in AM   OCD (obsessive compulsive disorder)/Generalized Anxiety  Disorder/ Fibromyalgia -Continue preadmission Clonazepam 0.5 mg po TIDprn, Lamotrigine 150 mg po BID, and Venlafaxine 75 mg po Daily  -C/w Adderall 10 mg po BID  Leukocytosis -WBC went from 9.0 -> 16.0 -> 18.3 -Likely from IV Steroid Demargination -Continue to Monitor for S/Sx of Infection -Repeat CBC as an outpatient with PCP   Discharge Instructions  Discharge Instructions    Call MD for:  difficulty breathing, headache or visual disturbances    Complete by:  As directed    Call MD for:  extreme fatigue    Complete by:  As directed    Call MD for:  hives    Complete by:  As directed    Call MD for:  persistant dizziness or light-headedness    Complete by:  As directed    Call MD for:  persistant nausea and vomiting    Complete by:  As directed    Call MD for:  redness, tenderness, or signs of infection (pain, swelling, redness, odor or green/yellow discharge around incision site)    Complete by:  As directed    Call MD for:  severe uncontrolled pain    Complete by:  As directed    Call MD for:  temperature >100.4    Complete by:  As directed    Diet general    Complete by:  As directed    Discharge instructions    Complete by:  As directed    Follow up with PCP and with Neurology as an outpatient. Take all medications as prescribed. If symptoms change or worsen please return to the ED for evaluation.   Increase activity slowly    Complete by:  As directed      Allergies as of 02/23/2017      Reactions   Amoxicillin Anaphylaxis   Penicillins Anaphylaxis   Sulfa Antibiotics Other (See Comments)   unknown   Codeine Nausea And Vomiting      Medication List    TAKE these medications   amphetamine-dextroamphetamine 10 MG tablet Commonly known as:  ADDERALL Take 1 tablet (10 mg total) by mouth 2 (two) times daily with a meal.   baclofen 10 MG tablet Commonly known as:   LIORESAL Take 1 tablet (10 mg total) by mouth 3 (three) times daily.   clonazePAM 0.5 MG tablet Commonly known as:  KLONOPIN Take 1 tablet (0.5 mg total) by mouth 3 (three) times daily as needed for anxiety.   desvenlafaxine 50 MG 24 hr tablet Commonly known as:  PRISTIQ TAKE 1 TABLET(50 MG) BY MOUTH DAILY   feeding supplement (ENSURE ENLIVE) Liqd Take 237 mLs by mouth 2 (two) times daily between meals.   HYDROcodone-acetaminophen 10-325 MG tablet Commonly known as:  NORCO Take 1 tablet by mouth 2 (two) times daily as needed.   lamoTRIgine 150 MG tablet Commonly known as:  LAMICTAL Take 1 tablet (150 mg total) by mouth 2 (two) times daily.   megestrol 40 MG tablet Commonly known as:  MEGACE Take 1 tablet (40 mg total) by mouth daily.   methocarbamol 500 MG tablet Commonly known as:  ROBAXIN Take 1 tablet (500 mg total) by mouth 3 (three) times daily.   natalizumab 300 MG/15ML injection Commonly known as:  TYSABRI Inject 15 mLs (300 mg total) into the vein every 30 (thirty) days.   oxybutynin 5 MG tablet Commonly known as:  DITROPAN Take 1 tablet (5 mg total) by mouth 2 (two) times daily as needed for bladder spasms.   pantoprazole  40 MG tablet Commonly known as:  PROTONIX Take 1 tablet (40 mg total) by mouth 2 (two) times daily.   senna 8.6 MG Tabs tablet Commonly known as:  SENOKOT Take 1 tablet (8.6 mg total) by mouth daily.       Allergies  Allergen Reactions  . Amoxicillin Anaphylaxis  . Penicillins Anaphylaxis  . Sulfa Antibiotics Other (See Comments)    unknown  . Codeine Nausea And Vomiting   Consultations:  Neurology   Procedures/Studies: Mr Laqueta Jean Wo Contrast  Result Date: 02/22/2017 CLINICAL DATA:  One-week of progressive bilateral leg weakness, visual changes, nocturnal incontinence and cloudy headed ; symptoms associated with prior MS exacerbation. Relapsing remitting multiple sclerosis, chronic pain syndrome. EXAM: MRI HEAD WITHOUT AND  WITH CONTRAST TECHNIQUE: Multiplanar, multiecho pulse sequences of the brain and surrounding structures were obtained without and with intravenous contrast. CONTRAST:  9mL MULTIHANCE GADOBENATE DIMEGLUMINE 529 MG/ML IV SOLN COMPARISON:  MRI of the head January 17, 2016 an MRI of the cervical spine August 17, 2015 FINDINGS: BRAIN: Patchy expansile T2 bright signal within the ventral cervicomedullary junction. Subcentimeter LEFT cerebellar, LEFT brachium pontis and pons are unchanged. Far greater than 10 supratentorial white matter lesions including confluent periventricular white matter lesions and, juxta cortical lesions. Many lesions radiate from the periventricular margin with low T1 signal compatible with black holes of demyelination. Bilateral thalamus/ internal capsule lesions radiating to the cerebral peduncle. Similar hazy enhancement supratentorial white matter, and around multiple white matter lesions. No reduced diffusion to suggest acute ischemia or hyperacute demyelination. No susceptibility artifact to suggest hemorrhage. Mild prominence of ventricles and sulci for patient's age. No masses or mass effect. No abnormal extra-axial fluid collections or abnormal extra-axial enhancement. 10 mm pineal cyst. VASCULAR: Normal major intracranial vascular flow voids present at skull base. SKULL AND UPPER CERVICAL SPINE: No abnormal sellar expansion. No suspicious calvarial bone marrow signal. Craniocervical junction maintained. SINUSES/ORBITS: The mastoid air-cells and included paranasal sinuses are well-aerated. The included ocular globes and orbital contents are non-suspicious. OTHER: None. IMPRESSION: 1. No acute intracranial process. 2. Stable severe chronic demyelination with similar mild hazy supratentorial enhancement. No new lesions. 3. Stable mild parenchymal brain volume loss for age. Electronically Signed   By: Awilda Metro M.D.   On: 02/22/2017 03:11   Mr Thoracic Spine W Wo Contrast  Result  Date: 02/22/2017 CLINICAL DATA:  One-week of progressive bilateral leg weakness, visual changes, nocturnal incontinence and cloudy headed ; symptoms associated with prior MS exacerbation. Relapsing remitting multiple sclerosis, chronic pain syndrome EXAM: MRI THORACIC WITHOUT AND WITH CONTRAST TECHNIQUE: Multiplanar and multiecho pulse sequences of the thoracic spine were obtained without and with intravenous contrast. CONTRAST:  9mL MULTIHANCE GADOBENATE DIMEGLUMINE 529 MG/ML IV SOLN COMPARISON:  None. FINDINGS: ALIGNMENT: Maintenance of the thoracic kyphosis. No malalignment. VERTEBRAE/DISCS: Vertebral bodies are intact. Intervertebral discs morphology and signal are normal. No abnormal or acute bone marrow signal. No abnormal osseous or disc enhancement. CORD: Thoracic spinal cord is normal morphology. Patchy abnormal T2 bright signal throughout the spinal cord, mildly expansile at T6-7. No convincing evidence of spinal cord enhancement though, pulsation artifact limits assessment on axial sequences. No abnormal epidural or leptomeningeal enhancement. PREVERTEBRAL AND PARASPINAL SOFT TISSUES:  Normal. DISC LEVELS: No disc bulge, canal stenosis or neural foraminal narrowing at any level. IMPRESSION: 1. Numerous demyelinating plaques throughout the thoracic spinal cord without enhancement. Considering mild expansile appearance at T6-7 suggesting acute lesion. No myelomalacia. Electronically Signed   By: Michel Santee.D.  On: 02/22/2017 03:17     Subjective: Seen and examined at bedside and she was sitting in chair and was improved. No CP or SOB and states she was not nauseous this AM. Ready to go home. Discussed with Neurology and she was deemed medically stable to be D/C'd Home today.   Discharge Exam: Vitals:   02/23/17 0536 02/23/17 0947  BP: 96/63 124/79  Pulse: 79 (!) 108  Resp: 20 18  Temp: 98 F (36.7 C) 98.4 F (36.9 C)  SpO2: 100% 100%   Vitals:   02/22/17 1636 02/22/17 2120  02/23/17 0536 02/23/17 0947  BP: (!) 100/58 (!) 97/52 96/63 124/79  Pulse: 97 82 79 (!) 108  Resp: 18 20 20 18   Temp: 98.6 F (37 C) 98.7 F (37.1 C) 98 F (36.7 C) 98.4 F (36.9 C)  TempSrc: Oral Oral Oral Oral  SpO2: 99% 99% 100% 100%  Weight:      Height:       General: Pt is a thin cachetic appearing [place] female who is alert, awake, not in acute distress Cardiovascular: Slightly tachycardic Rate but regular rhythm, S1/S2 +, no rubs, no gallops Respiratory: CTA bilaterally, no wheezing, no rhonchi Abdominal: Soft, NT, ND, bowel sounds + Extremities: no edema, no cyanosis  The results of significant diagnostics from this hospitalization (including imaging, microbiology, ancillary and laboratory) are listed below for reference.    Microbiology: No results found for this or any previous visit (from the past 240 hour(s)).   Labs: BNP (last 3 results) No results for input(s): BNP in the last 8760 hours. Basic Metabolic Panel:  Recent Labs Lab 02/21/17 0319 02/21/17 0740 02/22/17 0523 02/23/17 0452  NA 139  --  143 144  K 3.7  --  3.3* 4.7  CL 106  --  112* 114*  CO2 26  --  25 27  GLUCOSE 98  --  138* 138*  BUN 8  --  6 11  CREATININE 0.54  --  0.56 0.60  CALCIUM 9.2  --  9.0 9.0  MG  --  1.9  --  2.0  PHOS  --  2.9  --  2.8   Liver Function Tests:  Recent Labs Lab 02/22/17 0523 02/23/17 0452  AST 19 21  ALT 11* 13*  ALKPHOS 57 50  BILITOT 0.7 0.6  PROT 5.7* 5.4*  ALBUMIN 3.5 3.3*   No results for input(s): LIPASE, AMYLASE in the last 168 hours. No results for input(s): AMMONIA in the last 168 hours. CBC:  Recent Labs Lab 02/21/17 0319 02/22/17 0523 02/23/17 0452  WBC 9.0 16.0* 18.3*  NEUTROABS 4.3  --  15.2*  HGB 14.0 12.5 12.0  HCT 42.1 38.6 36.4  MCV 91.1 90.4 93.6  PLT 254 262 239   Cardiac Enzymes: No results for input(s): CKTOTAL, CKMB, CKMBINDEX, TROPONINI in the last 168 hours. BNP: Invalid input(s): POCBNP CBG: No results for  input(s): GLUCAP in the last 168 hours. D-Dimer No results for input(s): DDIMER in the last 72 hours. Hgb A1c No results for input(s): HGBA1C in the last 72 hours. Lipid Profile No results for input(s): CHOL, HDL, LDLCALC, TRIG, CHOLHDL, LDLDIRECT in the last 72 hours. Thyroid function studies  Recent Labs  02/21/17 0740  TSH 1.239   Anemia work up No results for input(s): VITAMINB12, FOLATE, FERRITIN, TIBC, IRON, RETICCTPCT in the last 72 hours. Urinalysis    Component Value Date/Time   COLORURINE YELLOW 02/21/2017 0435   APPEARANCEUR CLEAR 02/21/2017 0435  LABSPEC 1.008 02/21/2017 0435   PHURINE 6.0 02/21/2017 0435   GLUCOSEU NEGATIVE 02/21/2017 0435   HGBUR NEGATIVE 02/21/2017 0435   BILIRUBINUR NEGATIVE 02/21/2017 0435   KETONESUR NEGATIVE 02/21/2017 0435   PROTEINUR NEGATIVE 02/21/2017 0435   NITRITE NEGATIVE 02/21/2017 0435   LEUKOCYTESUR NEGATIVE 02/21/2017 0435   Sepsis Labs Invalid input(s): PROCALCITONIN,  WBC,  LACTICIDVEN Microbiology No results found for this or any previous visit (from the past 240 hour(s)).  Time coordinating discharge: 35 minutes  SIGNED:  Merlene Laughter, DO [facility] 02/23/2017, 11:17 AM Pager [phone]  If 7PM-7AM, please contact night-coverage [email or web address] Password [email or web address]

## 2017-02-23 NOTE — Evaluation (Signed)
Occupational Therapy Evaluation and Defer Further Services to [facility] Patient Details Name: Sabrina [name] MRN: [id number] DOB: August 21, 1980 Today's Date: 02/23/2017    History of Present Illness 36 y.o. female with medical history significant for relapsing-remitting multiple sclerosis, OCD, generalized anxiety disorder, fibromyalgia and chronic pain syndrome   Clinical Impression   PTA Pt requiring increasing assistance in ADL due to fatigue. OT spent significant time reviewing Energy conservation strategies and handout for ADL and IADL. Pt reports feeling much better today. No questions or concerns for OT at the end of session. Pt will require HHOT to reinforce safety and independence in ADL in her own environment.    Follow Up Recommendations  Home health OT    Equipment Recommendations  None recommended by OT    Recommendations for Other Services       Precautions / Restrictions Precautions Precautions: Fall Restrictions Weight Bearing Restrictions: No      Mobility Bed Mobility               General bed mobility comments: Pt recieved OOB in recliner  Transfers Overall transfer level: Needs assistance Equipment used: None Transfers: Sit to/from Stand Sit to Stand: Supervision         General transfer comment: no assist needed    Balance Overall balance assessment: Needs assistance Sitting-balance support: Feet supported Sitting balance-Leahy Scale: Good     Standing balance support: No upper extremity supported Standing balance-Leahy Scale: Good Standing balance comment: able to dress in standing without LOB                           ADL either performed or assessed with clinical judgement   ADL Overall ADL's : Needs assistance/impaired Eating/Feeding: Independent   Grooming: Supervision/safety;Standing;Wash/dry hands;Wash/dry face Grooming Details (indicate cue type and reason): sink level Upper Body Bathing: Supervision/  safety;Sitting Upper Body Bathing Details (indicate cue type and reason): reviewed energy conservation strategies Lower Body Bathing: With adaptive equipment;Supervison/ safety;Sitting/lateral leans Lower Body Bathing Details (indicate cue type and reason): energy conservation startegies reviewed Upper Body Dressing : Modified independent;Standing   Lower Body Dressing: Modified independent;Sit to/from stand   Toilet Transfer: Supervision/safety;Ambulation   Toileting- Clothing Manipulation and Hygiene: Modified independent;Sit to/from stand   Tub/ Shower Transfer: Tub transfer;Min guard;Ambulation;Shower seat   Functional mobility during ADLs: Supervision/safety (supervision for safety) General ADL Comments: Pt generally supervision for ADL - Pt provided with handout for energy conservation strategies     Vision Patient Visual Report: No change from baseline Vision Assessment?: No apparent visual deficits     Perception     Praxis      Pertinent Vitals/Pain Pain Assessment: Faces Faces Pain Scale: Hurts a little bit Pain Location: Back , IV site Pain Descriptors / Indicators: Aching;Discomfort Pain Intervention(s): Limited activity within patient's tolerance;Repositioned;Monitored during session     Hand Dominance Right   Extremity/Trunk Assessment Upper Extremity Assessment Upper Extremity Assessment: RUE deficits/detail;LUE deficits/detail;Generalized weakness RUE Deficits / Details: grossly 4/5 RUE Sensation: history of peripheral neuropathy LUE Deficits / Details: grossly 4/5 LUE Sensation: history of peripheral neuropathy   Lower Extremity Assessment Lower Extremity Assessment: Defer to PT evaluation   Cervical / Trunk Assessment Cervical / Trunk Assessment: Normal   Communication Communication Communication: No difficulties   Cognition Arousal/Alertness: Awake/alert Behavior During Therapy: WFL for tasks assessed/performed Overall Cognitive Status: Within  Functional Limits for tasks assessed  General Comments  Energy Electronics engineer provided along with handout    Exercises     Shoulder Instructions      Home Living Family/patient expects to be discharged to:: Private residence Living Arrangements: Spouse/significant other;Other relatives Available Help at Discharge: Family Type of Home: House Home Access: Level entry     Home Layout: One level     Bathroom Shower/Tub: Chief Strategy Officer: Standard Bathroom Accessibility: Yes   Home Equipment: Environmental consultant - 2 wheels;Shower seat          Prior Functioning/Environment Level of Independence: Needs assistance  Gait / Transfers Assistance Needed: independent with mobility ADL's / Homemaking Assistance Needed: needing increasing assist during bathing and dressing   Comments: per mom and patient, patient has required assist for basic ADLS due to extreme fatigue and has not been performing basic self care and hygiene tasks at this time        OT Problem List: Decreased strength;Decreased activity tolerance;Decreased knowledge of use of DME or AE      OT Treatment/Interventions:      OT Goals(Current goals can be found in the care plan section) Acute Rehab OT Goals Patient Stated Goal: to go home OT Goal Formulation: With patient Time For Goal Achievement: 03/09/17 Potential to Achieve Goals: Good  OT Frequency:     Barriers to D/C:            Co-evaluation              AM-PAC PT "6 Clicks" Daily Activity     Outcome Measure Help from another person eating meals?: None Help from another person taking care of personal grooming?: A Little Help from another person toileting, which includes using toliet, bedpan, or urinal?: None Help from another person bathing (including washing, rinsing, drying)?: A Little Help from another person to put on and taking off regular upper body clothing?:  None Help from another person to put on and taking off regular lower body clothing?: None 6 Click Score: 22   End of Session Nurse Communication: Mobility status;Other (comment) (IV site bleeding a little)  Activity Tolerance: Patient tolerated treatment well Patient left: in chair;with call bell/phone within reach  OT Visit Diagnosis: Muscle weakness (generalized) (M62.81)                Time: 0865-7846 OT Time Calculation (min): 32 min Charges:  OT General Charges $OT Visit: 1 Procedure OT Evaluation $OT Eval Moderate Complexity: 1 Procedure OT Treatments $Self Care/Home Management : 8-22 mins G-Codes:     Sherryl Manges OTR/L [phone]  Evern Bio Jeffory Snelgrove 02/23/2017, 10:37 AM

## 2017-02-23 NOTE — Telephone Encounter (Signed)
Dr. Laurence Slate with [facility] hospital called to update patient is still in the hospital for MS flare up should be getting discharged this week.  Per Dr. Laurence Slate he suggested patient to be seen sooner.  FYI

## 2017-02-23 NOTE — Progress Notes (Signed)
Discharge instructions reviewed with patient. All questions answered at this time. Pt is waiting for her transportation from [place], [place] at this time.     Sim Boast, RN

## 2017-02-23 NOTE — Progress Notes (Signed)
Subjective: Feeling much improved and ready to go home.   Exam: Vitals:   02/23/17 0536 02/23/17 0947  BP: 96/63 124/79  Pulse: 79 (!) 108  Resp: 20 18  Temp: 98 F (36.7 C) 98.4 F (36.9 C)  SpO2: 100% 100%    HEENT-  Normocephalic, no lesions, without obvious abnormality.  Normal external eye and conjunctiva.  Normal TM's bilaterally.  Normal auditory canals and external ears. Normal external nose, mucus membranes and septum.  Normal pharynx.   Neuro:  CN: Pupils are equal and round. They are symmetrically reactive from 3-->2 mm. EOMI without nystagmus. Facial sensation is intact to light touch. Face is symmetric at rest with normal strength and mobility. Hearing is intact to conversational voice. Palate elevates symmetrically and uvula is midline. Voice is normal in tone, pitch and quality. Bilateral SCM and trapezii are 5/5. Tongue is midline with normal bulk and mobility.  Motor: Normal bulk, tone, and strength. 5/5 throughout. No drift.  Sensation: Intact to light touch.  DTRs: 4+, symmetric , up going right toe,      Pertinent Labs/Diagnostics: none   Impression: MS Exacerbation   Recommendations: 1) no further solumedrol doses needed as patient has significant improvement.  2) PT recommended home health.   Neurology S/O--discussed with primary    02/23/2017, 9:53 AM

## 2017-02-23 NOTE — Care Management Note (Signed)
Case Management Note  Patient Details  Name: Sabrina [name] MRN: [id number] Date of Birth: 1980/12/05  Subjective/Objective:                    Action/Plan: Pt discharging home with [facility] services. CM met with the patient and provided her a list of [facility] agencies. She selected [facility]. CM spoke to [facility] and faxed them the information they requested.  Pt with orders for 3 in 1. Pt states she has one at home.  Pt states she is inbetween PCP's. She states she will find a PCP in [place] on her own. Currently she sees Dr Felecia Shelling (neurology) for most of her care.  Pts mother to provide transportation home.   Expected Discharge Date:  02/23/17               Expected Discharge Plan:  [place]  In-House Referral:     Discharge planning Services  CM Consult  Post Acute Care Choice:  Home Health Choice offered to:  Patient  DME Arranged:    DME Agency[facility]  HH Arranged:  PT, OT, RN, Nurse's Aide [place] Agency:   [facility])  Status of Service:  Completed, signed off  If discussed at [place] of Stay Meetings, dates discussed:    Additional Comments:  Pollie Friar, RN 02/23/2017, 12:48 PM

## 2017-02-24 DIAGNOSIS — G35 Multiple sclerosis: Secondary | ICD-10-CM | POA: Diagnosis not present

## 2017-02-24 DIAGNOSIS — F429 Obsessive-compulsive disorder, unspecified: Secondary | ICD-10-CM | POA: Diagnosis not present

## 2017-02-24 DIAGNOSIS — F411 Generalized anxiety disorder: Secondary | ICD-10-CM | POA: Diagnosis not present

## 2017-02-24 DIAGNOSIS — F17210 Nicotine dependence, cigarettes, uncomplicated: Secondary | ICD-10-CM | POA: Diagnosis not present

## 2017-02-24 DIAGNOSIS — M797 Fibromyalgia: Secondary | ICD-10-CM | POA: Diagnosis not present

## 2017-02-24 DIAGNOSIS — G894 Chronic pain syndrome: Secondary | ICD-10-CM | POA: Diagnosis not present

## 2017-02-28 ENCOUNTER — Ambulatory Visit (INDEPENDENT_AMBULATORY_CARE_PROVIDER_SITE_OTHER): Payer: Medicare Other | Admitting: Neurology

## 2017-02-28 ENCOUNTER — Encounter: Payer: Self-pay | Admitting: Neurology

## 2017-02-28 VITALS — BP 114/73 | HR 98 | Resp 16 | Ht 64.0 in | Wt 88.0 lb

## 2017-02-28 DIAGNOSIS — G8929 Other chronic pain: Secondary | ICD-10-CM | POA: Diagnosis not present

## 2017-02-28 DIAGNOSIS — R269 Unspecified abnormalities of gait and mobility: Secondary | ICD-10-CM

## 2017-02-28 DIAGNOSIS — R634 Abnormal weight loss: Secondary | ICD-10-CM | POA: Diagnosis not present

## 2017-02-28 DIAGNOSIS — G35 Multiple sclerosis: Secondary | ICD-10-CM

## 2017-02-28 DIAGNOSIS — M5432 Sciatica, left side: Secondary | ICD-10-CM

## 2017-02-28 DIAGNOSIS — F09 Unspecified mental disorder due to known physiological condition: Secondary | ICD-10-CM | POA: Diagnosis not present

## 2017-02-28 DIAGNOSIS — R5383 Other fatigue: Secondary | ICD-10-CM

## 2017-02-28 DIAGNOSIS — R208 Other disturbances of skin sensation: Secondary | ICD-10-CM

## 2017-02-28 MED ORDER — CLONAZEPAM 0.5 MG PO TABS: 0.5000 mg | ORAL_TABLET | Freq: Three times a day (TID) | ORAL | 5 refills | Status: DC | PRN

## 2017-02-28 MED ORDER — DULOXETINE HCL 60 MG PO CPEP: 60.0000 mg | ORAL_CAPSULE | Freq: Every day | ORAL | 11 refills | Status: DC

## 2017-02-28 MED ORDER — AMPHETAMINE-DEXTROAMPHETAMINE 10 MG PO TABS: 10.0000 mg | ORAL_TABLET | Freq: Two times a day (BID) | ORAL | 0 refills | Status: DC

## 2017-02-28 MED ORDER — HYDROCODONE-ACETAMINOPHEN 10-325 MG PO TABS: 1.0000 | ORAL_TABLET | Freq: Two times a day (BID) | ORAL | 0 refills | Status: DC | PRN

## 2017-02-28 NOTE — Progress Notes (Signed)
u  [facility]  PATIENT: Sabrina [name] DOB: 1981-01-08    _________________________________   HISTORICAL  CHIEF COMPLAINT:  Chief Complaint  Patient presents with  . Multiple Sclerosis    On Tysabri and denies missed doses.  New right leg weakness, hospitalized last wk. at [facility], where MRI shows new Thoracic lesion.  Here to discuss tx. options/fim    HISTORY OF PRESENT ILLNESS:  Sabrina [name] is a 36 year old woman with multiple sclerosis and chronic pain.       She went to [facility] last week after noting more fogginess and having more pain and weakness in her legs, especially the right leg.   She fell down in the seat and couch having trouble getting up.   She was more emotional and foggy headed.   She went to [facility] ED and had MRI's.  The brain looks the same.     She has multiple thoracic spine lesions and one at C6C7 looked expansile but did not enhance (unclear if subacute or not).   She had several days of IV Steroids.    She is now feeling back to her baseline. I personally reviewed the MRI of the brain and MRI of the thoracic spine performed during her recent hospitalization. The MRI of the brain shows many T2/FLAIR hyperintense foci in the periventricular, juxtacortical and deep white matter. Her to be any change when compared to her previous MRI. The MRI of the thoracic spine shows multiple T2 hyperintense lesions. The T6-T7 lesion is fairly large and there could be slight expansion of the spinal cord. However, there is no enhancement.      MS:   She continues to do monthly Tysabri (in [place]) since 2016.   She is tolerating the infusions well.   This is her first possible exacerbations since starting Tysabri. She has done much better on Tysabri that she did on Copaxone.  Gait/strength/sensation/pain:   Currently her gait is back to baseline. She is able to walk without a cane most of the time. However, when pain accepts uses cane or walker. Mild  weakness in her legs. There are some painful dysesthesias in the legs.  Vision:      She notes no change in the right blurry vision.    She denies any diplopia.  Sciatica/pain:  She continues to experience sciatic type pain in the back and left leg. In the past, piriformis injections have helped her. Pain is not too bad since she received several days of steroids to the hospital.  In the past, she received the best benefit with Botox but her co-pay was too high.  Other med's including gabapentin, Lyrica and Tegretol are not well tolerated.  Lamictal helps the pain some.      Lumbar MRI was normal.      Bladder:  She notes stable urinary frequency and urgency and has nocturnal incontinence once a month.  She has 1 - 2 times nocturia and sometimes has nocturnal incontinence.   In the past she was on oxybutynin with benefit.  She denies hesitrancy.   Fatigue/sleep:   She has fatigue is physical more than mental. Adderall has helped her fatigue quite a bit. .  Even though she has weight loss, she feels appetite is not suppressed.    Sometimes, she will take only one Adderall pill and occasionally does not take any..       She feels she sleeps much better now than last year and RLS/PLMS is better.  Mood/cognition:   Depression and anxiety are doing a little bit better this visit than last visit..  She notes stress with family and the holidays.   She has had some panic attacks and clonazepam helps the most.   She notes mild cognitive issues with poor focus and attention and reduced verbal fluency.    She is on Pristiq and lamotrigine with benefit  MS History:  She had right optic neuritis in December 2012 and had an MRI of the brain showing optic nerve inflammation and many white matter spots.    She saw Dr. Macon Large in [place].   She was started on Copaxone.  She likely had an exacerbation in 2013 when she had trouble walking x 2 months.   She received a few days of IV Solu-Medrol.  Last year, she also  received a few days of IV Solu-Medrol but she is uncertain   She felt she did well for the first 2 years but switched to [place] because she was tired of the shots last year.   At first, she tolerated it well but then her depression got much worse and she had some stomach issues so she returned to Copaxone last month (20 mg daily).   MRI early 2016 showed multiple old MS plaques and 1  Enhancing focus in the right frontal lobe prompting change to Tysabi.     REVIEW OF SYSTEMS: Constitutional: No fevers, chills, sweats, or change in appetite.  Notes a lot of fatigue Eyes: No visual changes, double vision, eye pain Ear, nose and throat: No hearing loss, ear pain, nasal congestion, sore throat Cardiovascular: No chest pain, palpitations Respiratory: No shortness of breath at rest or with exertion.   No wheezes GastrointestinaI: No nausea, vomiting, diarrhea, abdominal pain, fecal incontinence Genitourinary: as above. Musculoskeletal: No neck pain, back pain Integumentary: No rash, pruritus, skin lesions Neurological: as above Psychiatric: Notes some depression and anxiety Endocrine: No palpitations, diaphoresis, change in appetite, change in weigh or increased thirst Hematologic/Lymphatic: No anemia, purpura, petechiae. Allergic/Immunologic: No itchy/runny eyes, nasal congestion, recent allergic reactions, rashes  ALLERGIES: Allergies  Allergen Reactions  . Amoxicillin Anaphylaxis  . Penicillins Anaphylaxis  . Sulfa Antibiotics Other (See Comments)    unknown  . Codeine Nausea And Vomiting    HOME MEDICATIONS:  Current Outpatient Prescriptions:  .  amphetamine-dextroamphetamine (ADDERALL) 10 MG tablet, Take 1 tablet (10 mg total) by mouth 2 (two) times daily with a meal., Disp: 60 tablet, Rfl: 0 .  baclofen (LIORESAL) 10 MG tablet, Take 1 tablet (10 mg total) by mouth 3 (three) times daily., Disp: 90 each, Rfl: 3 .  clonazePAM (KLONOPIN) 0.5 MG tablet, Take 1 tablet (0.5 mg total)  by mouth 3 (three) times daily as needed for anxiety., Disp: 90 tablet, Rfl: 5 .  feeding supplement, ENSURE ENLIVE, (ENSURE ENLIVE) LIQD, Take 237 mLs by mouth 2 (two) times daily between meals., Disp: 237 mL, Rfl: 12 .  HYDROcodone-acetaminophen (NORCO) 10-325 MG tablet, Take 1 tablet by mouth 2 (two) times daily as needed., Disp: 60 tablet, Rfl: 0 .  lamoTRIgine (LAMICTAL) 150 MG tablet, Take 1 tablet (150 mg total) by mouth 2 (two) times daily., Disp: 60 tablet, Rfl: 11 .  megestrol (MEGACE) 40 MG tablet, Take 1 tablet (40 mg total) by mouth daily., Disp: 30 tablet, Rfl: 11 .  methocarbamol (ROBAXIN) 500 MG tablet, Take 1 tablet (500 mg total) by mouth 3 (three) times daily., Disp: 90 tablet, Rfl: 5 .  natalizumab (TYSABRI) 300 MG/15ML  injection, Inject 15 mLs (300 mg total) into the vein every 30 (thirty) days., Disp: 15 mL, Rfl: 12 .  oxybutynin (DITROPAN) 5 MG tablet, Take 1 tablet (5 mg total) by mouth 2 (two) times daily as needed for bladder spasms., Disp: 60 tablet, Rfl: 11 .  pantoprazole (PROTONIX) 40 MG tablet, Take 1 tablet (40 mg total) by mouth 2 (two) times daily., Disp: 60 tablet, Rfl: 0 .  senna (SENOKOT) 8.6 MG TABS tablet, Take 1 tablet (8.6 mg total) by mouth daily., Disp: 120 each, Rfl: 0 .  DULoxetine (CYMBALTA) 60 MG capsule, Take 1 capsule (60 mg total) by mouth daily., Disp: 30 capsule, Rfl: 11  PAST MEDICAL HISTORY: Past Medical History:  Diagnosis Date  . Chronic pain syndrome   . Depression   . Fibromyalgia   . GAD (generalized anxiety disorder)   . Headache   . Multiple sclerosis (HCC) 10/28/2014  . Obsessive compulsive disorder   . Vision abnormalities     PAST SURGICAL HISTORY: Past Surgical History:  Procedure Laterality Date  . NO PAST SURGERIES      FAMILY HISTORY: Family History  Problem Relation Age of Onset  . Healthy Mother   . Diabetes type II Father   . Prostate cancer Father     SOCIAL HISTORY:  Social History   Social History  .  Marital status: Married    Spouse name: N/A  . Number of children: N/A  . Years of education: N/A   Occupational History  . Not on file.   Social History Main Topics  . Smoking status: Current Every Day Smoker    Packs/day: 0.50    Types: Cigarettes  . Smokeless tobacco: Never Used  . Alcohol use No  . Drug use: No  . Sexual activity: Not on file   Other Topics Concern  . Not on file   Social History Narrative  . No narrative on file     PHYSICAL EXAM  Vitals:   02/28/17 0916  BP: 114/73  Pulse: 98  Resp: 16  Weight: 88 lb (39.9 kg)  Height: 5\' 4"  (1.626 m)    Body mass index is 15.11 kg/m.   General: The patient is well-developed and well-nourished and in no acute distress  Musculoskeletal:   She is tender over the left > right piriformis muscle. No trochanteric bursa tenderness. Paraspinal muscles are just mildly tender.  Neurologic Exam  Mental status:  The patient is alert and oriented x 3 at the time of the examination. The patient has apparent normal recent and remote memory, with a mildly reduced attention span and concentration ability.     Cranial nerves: Extraocular movements are full.  Facial symmetry is present. There is good facial sensation to soft touch bilaterally.Facial strength is normal.  Trapezius and sternocleidomastoid strength is normal. No dysarthria is noted.   No obvious hearing deficits are noted.  Motor:  Muscle bulk is normal.   Tone is mildly increased in legs. Strength is  5/5.   Sensory: She has normal sensation in her arms. She reports reduced sensation to vibration in the legs.   Coordination: Cerebellar testing reveals good finger-nose-finger bilaterally.  Gait and station: Station is normal.   Her gait is mildly wide but better than last visit. Tandem gait is wide. Romberg is negative.   Reflexes: Deep tendon reflexes are symmetric and increased bilaterally with 2 beats clonue at the ankles.      DIAGNOSTIC DATA (LABS,  IMAGING, TESTING) - I  reviewed patient records, labs, notes, testing and imaging myself where available.       ASSESSMENT AND PLAN  Multiple sclerosis (HCC)  Gait disorder  Other fatigue  Cognitive dysfunction  Dysesthesia  Left sided sciatica  Other chronic pain  Abnormal weight loss   1.   I do not think that the recent worsening was due to an exacerbation she is now back at her baseline. She will Continue Tysabri.   Infusions in [place]   2.   Refill Adderall for fatigue and attentional deficits and hydrocodone for pain. 3.    ContinueLamotrigine to 150 mg po bid  4.   rtc 4 months, sooner if problems and sooner for Botox  40 minutes face-to-face evaluation with greater than one half of the time counseling correlating care about her recent MS related symptoms and treatment options.  Richard A. Epimenio Foot, MD, PhD 02/28/2017, 5:23 PM Certified in Neurology, Clinical Neurophysiology, Sleep Medicine, Pain Medicine and Neuroimaging  [facility] [address] [place], [place] [address] [phone]

## 2017-03-06 ENCOUNTER — Inpatient Hospital Stay: Admission: RE | Admit: 2017-03-06 | Payer: Medicare Other | Source: Ambulatory Visit

## 2017-03-06 ENCOUNTER — Other Ambulatory Visit: Payer: Medicare Other

## 2017-03-21 ENCOUNTER — Telehealth: Payer: Self-pay | Admitting: Neurology

## 2017-03-21 NOTE — Telephone Encounter (Signed)
I have spoken with Victorino Dike, RN at infusion center and explained that pt. was hospitalized, only received SoluMedrol IV.  RAS has seen her since the hospitalization and would like her to continue Tysabri.  It is ok to infuse her as regularly scheduled/fim

## 2017-03-21 NOTE — Telephone Encounter (Signed)
Rn Victorino Dike calling to inform that pt was admitted 2 weeks ago to [facility] and has had treatment.  RN Victorino Dike states she needs to know plan of action since pt has MS and will not be able to get an infusion since she has had treatment.  Please call

## 2017-03-23 DIAGNOSIS — G35 Multiple sclerosis: Secondary | ICD-10-CM | POA: Diagnosis not present

## 2017-04-11 ENCOUNTER — Telehealth: Payer: Self-pay | Admitting: Neurology

## 2017-04-11 MED ORDER — HYDROCODONE-ACETAMINOPHEN 10-325 MG PO TABS: 1.0000 | ORAL_TABLET | Freq: Two times a day (BID) | ORAL | 0 refills | Status: DC | PRN

## 2017-04-11 MED ORDER — AMPHETAMINE-DEXTROAMPHETAMINE 10 MG PO TABS: 10.0000 mg | ORAL_TABLET | Freq: Two times a day (BID) | ORAL | 0 refills | Status: DC

## 2017-04-11 NOTE — Telephone Encounter (Signed)
Rx's up front [facility]/fm

## 2017-04-11 NOTE — Addendum Note (Signed)
Addended by: Candis Schatz I on: 04/11/2017 01:21 PM   Modules accepted: Orders

## 2017-04-11 NOTE — Telephone Encounter (Signed)
Rx's awaiting RAS sig/fim 

## 2017-04-11 NOTE — Telephone Encounter (Signed)
Patient called office requesting refills for HYDROcodone-acetaminophen (NORCO) 10-325 MG tablet and amphetamine-dextroamphetamine (ADDERALL) 10 MG tablet

## 2017-04-20 DIAGNOSIS — G35 Multiple sclerosis: Secondary | ICD-10-CM | POA: Diagnosis not present

## 2017-04-24 ENCOUNTER — Telehealth: Payer: Self-pay | Admitting: *Deleted

## 2017-04-24 MED ORDER — NATALIZUMAB 300 MG/15ML IV CONC: 300.0000 mg | INTRAVENOUS | 12 refills | Status: DC

## 2017-04-24 NOTE — Telephone Encounter (Signed)
Tysabri rx. faxed to [facility] (infusion suite) per faxed request/fim

## 2017-04-25 ENCOUNTER — Ambulatory Visit: Payer: Medicare Other | Admitting: Neurology

## 2017-05-11 ENCOUNTER — Telehealth: Payer: Self-pay | Admitting: Neurology

## 2017-05-11 MED ORDER — AMPHETAMINE-DEXTROAMPHETAMINE 10 MG PO TABS: 10.0000 mg | ORAL_TABLET | Freq: Two times a day (BID) | ORAL | 0 refills | Status: DC

## 2017-05-11 MED ORDER — HYDROCODONE-ACETAMINOPHEN 10-325 MG PO TABS: 1.0000 | ORAL_TABLET | Freq: Two times a day (BID) | ORAL | 0 refills | Status: DC | PRN

## 2017-05-11 NOTE — Telephone Encounter (Signed)
Pt request refill for amphetamine-dextroamphetamine (ADDERALL) 10 MG tablet and HYDROcodone-acetaminophen (NORCO) 10-325 MG tablet . Pt is aware clinic closes at noon on Friday

## 2017-05-11 NOTE — Telephone Encounter (Signed)
Rx's up front [facility]/fim 

## 2017-05-11 NOTE — Telephone Encounter (Signed)
Rx's awaiting RAS sig/fim 

## 2017-05-18 ENCOUNTER — Telehealth: Payer: Self-pay | Admitting: Neurology

## 2017-05-18 DIAGNOSIS — G35 Multiple sclerosis: Secondary | ICD-10-CM | POA: Diagnosis not present

## 2017-05-18 MED ORDER — NATALIZUMAB 300 MG/15ML IV CONC: 300.0000 mg | INTRAVENOUS | 12 refills | Status: DC

## 2017-05-18 NOTE — Telephone Encounter (Signed)
Ty rx. faxed to Amy at [facility] as requested/fim

## 2017-05-18 NOTE — Addendum Note (Signed)
Addended by: Candis Schatz I on: 05/18/2017 09:10 AM   Modules accepted: Orders

## 2017-05-18 NOTE — Telephone Encounter (Signed)
Amy called stating pt ran out of Tysabri on yesterday and is due back within a few hours, Amy is requesting an order for the Tysabri .  You can call her at [phone] or fax it to her at [phone]

## 2017-05-24 ENCOUNTER — Encounter: Payer: Self-pay | Admitting: *Deleted

## 2017-06-09 ENCOUNTER — Telehealth: Payer: Self-pay | Admitting: Neurology

## 2017-06-09 MED ORDER — AMPHETAMINE-DEXTROAMPHETAMINE 10 MG PO TABS: 10.0000 mg | ORAL_TABLET | Freq: Two times a day (BID) | ORAL | 0 refills | Status: DC

## 2017-06-09 MED ORDER — HYDROCODONE-ACETAMINOPHEN 10-325 MG PO TABS: 1.0000 | ORAL_TABLET | Freq: Two times a day (BID) | ORAL | 0 refills | Status: DC | PRN

## 2017-06-09 NOTE — Telephone Encounter (Signed)
Rx's awaiting RAS sig/fim 

## 2017-06-09 NOTE — Telephone Encounter (Signed)
Rx's up front [facility]/fim 

## 2017-06-09 NOTE — Telephone Encounter (Signed)
Pt request refill for HYDROcodone-acetaminophen (NORCO) 10-325 MG tablet and amphetamine-dextroamphetamine (ADDERALL) 10 MG tablet .

## 2017-06-15 DIAGNOSIS — G35 Multiple sclerosis: Secondary | ICD-10-CM | POA: Diagnosis not present

## 2017-06-29 ENCOUNTER — Other Ambulatory Visit: Payer: Self-pay | Admitting: Neurology

## 2017-07-07 ENCOUNTER — Telehealth: Payer: Self-pay | Admitting: Neurology

## 2017-07-07 NOTE — Telephone Encounter (Signed)
Pt request refill for HYDROcodone-acetaminophen (NORCO) 10-325 MG tablet and amphetamine-dextroamphetamine (ADDERALL) 10 MG tablet . Pt is aware the clinic closes at noon today and will reopen on 07/12/17

## 2017-07-12 MED ORDER — AMPHETAMINE-DEXTROAMPHETAMINE 10 MG PO TABS: 10.0000 mg | ORAL_TABLET | Freq: Two times a day (BID) | ORAL | 0 refills | Status: DC

## 2017-07-12 MED ORDER — HYDROCODONE-ACETAMINOPHEN 10-325 MG PO TABS: 1.0000 | ORAL_TABLET | Freq: Two times a day (BID) | ORAL | 0 refills | Status: DC | PRN

## 2017-07-12 NOTE — Telephone Encounter (Signed)
Rx's up front [facility]/fim 

## 2017-07-12 NOTE — Telephone Encounter (Signed)
Pt called back checking on refill request, I told her they are not ready yet and to call back this afternoon. She was appreciative

## 2017-07-12 NOTE — Telephone Encounter (Signed)
Rx's awaiting RAS sig/fim 

## 2017-07-17 DIAGNOSIS — G35 Multiple sclerosis: Secondary | ICD-10-CM | POA: Diagnosis not present

## 2017-07-26 ENCOUNTER — Encounter: Payer: Self-pay | Admitting: Neurology

## 2017-07-26 ENCOUNTER — Ambulatory Visit (INDEPENDENT_AMBULATORY_CARE_PROVIDER_SITE_OTHER): Payer: Medicare Other | Admitting: Neurology

## 2017-07-26 ENCOUNTER — Other Ambulatory Visit: Payer: Self-pay

## 2017-07-26 VITALS — BP 105/70 | HR 78 | Resp 14 | Ht 64.0 in | Wt 86.5 lb

## 2017-07-26 DIAGNOSIS — M5432 Sciatica, left side: Secondary | ICD-10-CM | POA: Diagnosis not present

## 2017-07-26 DIAGNOSIS — G8929 Other chronic pain: Secondary | ICD-10-CM

## 2017-07-26 DIAGNOSIS — R269 Unspecified abnormalities of gait and mobility: Secondary | ICD-10-CM | POA: Diagnosis not present

## 2017-07-26 DIAGNOSIS — M5417 Radiculopathy, lumbosacral region: Secondary | ICD-10-CM

## 2017-07-26 DIAGNOSIS — R350 Frequency of micturition: Secondary | ICD-10-CM | POA: Diagnosis not present

## 2017-07-26 DIAGNOSIS — G35 Multiple sclerosis: Secondary | ICD-10-CM | POA: Diagnosis not present

## 2017-07-26 DIAGNOSIS — R5383 Other fatigue: Secondary | ICD-10-CM

## 2017-07-26 DIAGNOSIS — R208 Other disturbances of skin sensation: Secondary | ICD-10-CM | POA: Diagnosis not present

## 2017-07-26 DIAGNOSIS — R634 Abnormal weight loss: Secondary | ICD-10-CM | POA: Diagnosis not present

## 2017-07-26 MED ORDER — HYDROCODONE-ACETAMINOPHEN 10-325 MG PO TABS: 1.0000 | ORAL_TABLET | Freq: Two times a day (BID) | ORAL | 0 refills | Status: DC | PRN

## 2017-07-26 MED ORDER — MODAFINIL 200 MG PO TABS: 200.0000 mg | ORAL_TABLET | Freq: Every day | ORAL | 5 refills | Status: DC

## 2017-07-26 NOTE — Progress Notes (Signed)
u  [facility]  PATIENT: Sabrina [name] DOB: 11/18/1980    _________________________________   HISTORICAL  CHIEF COMPLAINT:  Chief Complaint  Patient presents with  . Multiple Sclerosis    Sts. she continues to tolerate Tysabri well.  JCV ab last checked 10/17/16 and was negaitve at 0.17.  Sts. she continues to have intermittent pain in bilat buttocks, legs/fim    HISTORY OF PRESENT ILLNESS:  Sabrina [name] is a 37 year old woman with multiple sclerosis and chronic pain.       Update 07/26/2017: She is doing her monthly Tysabri in [place]. She tolerates the infusions well. She has done much better on Tysabri than on Copaxone. She is JCV antibody negative. The last test was 10/17/2016 and was negative and 0.17. Imbalance is unchanged.    She has had a couple falls with tripping but is no worse..  . She is able to walk without a cane but will sometimes use a cane or walker if she feels weaker or more tired. He has painful dysesthesias in her legs.    She has mildly reduced vision out of the right eye. She has urinary frequency and urgency. This is fairly stable and she has rare night time incontinence.     He continues to report fatigue.  She has been prescribed Adderall but does not take on a regular basis. Her weight is a little lower than the last visit at only 86 pounds. Megace had not helped her weight gain. Sleep is variable and she still has some trouble falling asleep and staying asleep. She notes some depression but feels better currently than much of last year. She notes some decreased focus and attention, also helped by Adderall when she takes it.  She has bilateral sciatica with pain in the buttocks radiating to the legs. Trigger point injections have helped (piriformis).   Lumbar MRI was ok.   Lamotrigine helps some.     Hydrocodone helps the pain.      From 02/28/2017: She went to [facility] last week after noting more fogginess and having more pain and  weakness in her legs, especially the right leg.   She fell down in the seat and couch having trouble getting up.   She was more emotional and foggy headed.   She went to [facility] ED and had MRI's.  The brain looks the same.     She has multiple thoracic spine lesions and one at C6C7 looked expansile but did not enhance (unclear if subacute or not).   She had several days of IV Steroids.    She is now feeling back to her baseline. I personally reviewed the MRI of the brain and MRI of the thoracic spine performed during her recent hospitalization. The MRI of the brain shows many T2/FLAIR hyperintense foci in the periventricular, juxtacortical and deep white matter. Her to be any change when compared to her previous MRI. The MRI of the thoracic spine shows multiple T2 hyperintense lesions. The T6-T7 lesion is fairly large and there could be slight expansion of the spinal cord. However, there is no enhancement.      MS:   She continues to do monthly Tysabri (in [place]) since 2016.   She is tolerating the infusions well.   This is her first possible exacerbations since starting Tysabri. She has done much better on Tysabri that she did on Copaxone.  Gait/strength/sensation/pain:   Currently her gait is back to baseline. She is able to walk without a  cane most of the time. However, when pain accepts uses cane or walker. Mild weakness in her legs. There are some painful dysesthesias in the legs.  Vision:      She notes no change in the right blurry vision.    She denies any diplopia.  Sciatica/pain:  She continues to experience sciatic type pain in the back and left leg. In the past, piriformis injections have helped her. Pain is not too bad since she received several days of steroids to the hospital.  In the past, she received the best benefit with Botox but her co-pay was too high.  Other med's including gabapentin, Lyrica and Tegretol are not well tolerated.  Lamictal helps the pain some.      Lumbar MRI was  normal.      Bladder:  She notes stable urinary frequency and urgency and has nocturnal incontinence once a month.  She has 1 - 2 times nocturia and sometimes has nocturnal incontinence.   In the past she was on oxybutynin with benefit.  She denies hesitrancy.   Fatigue/sleep:   She has fatigue is physical more than mental. Adderall has helped her fatigue quite a bit. .  Even though she has weight loss, she feels appetite is not suppressed.    Sometimes, she will take only one Adderall pill and occasionally does not take any..       She feels she sleeps much better now than last year and RLS/PLMS is better.    Mood/cognition:   Depression and anxiety are doing a little bit better this visit than last visit..  She notes stress with family and the holidays.   She has had some panic attacks and clonazepam helps the most.   She notes mild cognitive issues with poor focus and attention and reduced verbal fluency.    She is on Pristiq and lamotrigine with benefit  MS History:  She had right optic neuritis in December 2012 and had an MRI of the brain showing optic nerve inflammation and many white matter spots.    She saw Dr. Macon Large in [place].   She was started on Copaxone.  She likely had an exacerbation in 2013 when she had trouble walking x 2 months.   She received a few days of IV Solu-Medrol.  Last year, she also received a few days of IV Solu-Medrol but she is uncertain   She felt she did well for the first 2 years but switched to Gilenya because she was tired of the shots last year.   At first, she tolerated it well but then her depression got much worse and she had some stomach issues so she returned to Copaxone last month (20 mg daily).   MRI early 2016 showed multiple old MS plaques and 1  Enhancing focus in the right frontal lobe prompting change to Tysabi.     REVIEW OF SYSTEMS: Constitutional: No fevers, chills, sweats, or change in appetite.  Notes a lot of fatigue Eyes: No visual changes,  double vision, eye pain Ear, nose and throat: No hearing loss, ear pain, nasal congestion, sore throat Cardiovascular: No chest pain, palpitations Respiratory: No shortness of breath at rest or with exertion.   No wheezes GastrointestinaI: No nausea, vomiting, diarrhea, abdominal pain, fecal incontinence Genitourinary: as above. Musculoskeletal: No neck pain, back pain Integumentary: No rash, pruritus, skin lesions Neurological: as above Psychiatric: Notes some depression and anxiety Endocrine: No palpitations, diaphoresis, change in appetite, change in weigh or increased thirst Hematologic/Lymphatic: No  anemia, purpura, petechiae. Allergic/Immunologic: No itchy/runny eyes, nasal congestion, recent allergic reactions, rashes  ALLERGIES: Allergies  Allergen Reactions  . Amoxicillin Anaphylaxis  . Penicillins Anaphylaxis  . Sulfa Antibiotics Other (See Comments)    unknown  . Codeine Nausea And Vomiting    HOME MEDICATIONS:  Current Outpatient Medications:  .  amphetamine-dextroamphetamine (ADDERALL) 10 MG tablet, Take 1 tablet (10 mg total) by mouth 2 (two) times daily with a meal., Disp: 60 tablet, Rfl: 0 .  baclofen (LIORESAL) 10 MG tablet, Take 1 tablet (10 mg total) by mouth 3 (three) times daily., Disp: 90 each, Rfl: 3 .  clonazePAM (KLONOPIN) 0.5 MG tablet, Take 1 tablet (0.5 mg total) by mouth 3 (three) times daily as needed for anxiety., Disp: 90 tablet, Rfl: 5 .  DULoxetine (CYMBALTA) 60 MG capsule, TAKE 1 CAPSULE(60 MG) BY MOUTH DAILY, Disp: 90 capsule, Rfl: 3 .  feeding supplement, ENSURE ENLIVE, (ENSURE ENLIVE) LIQD, Take 237 mLs by mouth 2 (two) times daily between meals., Disp: 237 mL, Rfl: 12 .  HYDROcodone-acetaminophen (NORCO) 10-325 MG tablet, Take 1 tablet by mouth 2 (two) times daily as needed., Disp: 60 tablet, Rfl: 0 .  lamoTRIgine (LAMICTAL) 150 MG tablet, Take 1 tablet (150 mg total) by mouth 2 (two) times daily., Disp: 60 tablet, Rfl: 11 .  megestrol  (MEGACE) 40 MG tablet, Take 1 tablet (40 mg total) by mouth daily., Disp: 30 tablet, Rfl: 11 .  natalizumab (TYSABRI) 300 MG/15ML injection, Inject 15 mLs (300 mg total) every 30 (thirty) days into the vein., Disp: 15 mL, Rfl: 12 .  oxybutynin (DITROPAN) 5 MG tablet, Take 1 tablet (5 mg total) by mouth 2 (two) times daily as needed for bladder spasms., Disp: 60 tablet, Rfl: 11 .  pantoprazole (PROTONIX) 40 MG tablet, Take 1 tablet (40 mg total) by mouth 2 (two) times daily., Disp: 60 tablet, Rfl: 0 .  senna (SENOKOT) 8.6 MG TABS tablet, Take 1 tablet (8.6 mg total) by mouth daily., Disp: 120 each, Rfl: 0 .  methocarbamol (ROBAXIN) 500 MG tablet, Take 1 tablet (500 mg total) by mouth 3 (three) times daily., Disp: 90 tablet, Rfl: 5 .  modafinil (PROVIGIL) 200 MG tablet, Take 1 tablet (200 mg total) by mouth daily., Disp: 30 tablet, Rfl: 5  PAST MEDICAL HISTORY: Past Medical History:  Diagnosis Date  . Chronic pain syndrome   . Depression   . Fibromyalgia   . GAD (generalized anxiety disorder)   . Headache   . Multiple sclerosis (HCC) 10/28/2014  . Obsessive compulsive disorder   . Vision abnormalities     PAST SURGICAL HISTORY: Past Surgical History:  Procedure Laterality Date  . NO PAST SURGERIES      FAMILY HISTORY: Family History  Problem Relation Age of Onset  . Healthy Mother   . Diabetes type II Father   . Prostate cancer Father     SOCIAL HISTORY:  Social History   Socioeconomic History  . Marital status: Married    Spouse name: Not on file  . Number of children: Not on file  . Years of education: Not on file  . Highest education level: Not on file  Social Needs  . Financial resource strain: Not on file  . Food insecurity - worry: Not on file  . Food insecurity - inability: Not on file  . Transportation needs - medical: Not on file  . Transportation needs - non-medical: Not on file  Occupational History  . Not on file  Tobacco  Use  . Smoking status: Current  Every Day Smoker    Packs/day: 0.50    Types: Cigarettes  . Smokeless tobacco: Never Used  Substance and Sexual Activity  . Alcohol use: No    Alcohol/week: 0.0 oz  . Drug use: No  . Sexual activity: Not on file  Other Topics Concern  . Not on file  Social History Narrative  . Not on file     PHYSICAL EXAM  Vitals:   07/26/17 1458  BP: 105/70  Pulse: 78  Resp: 14  Weight: 86 lb 8 oz (39.2 kg)  Height: 5\' 4"  (1.626 m)    Body mass index is 14.85 kg/m.   General: The patient is well-developed and well-nourished and in no acute distress  Musculoskeletal:   She has mild tenderness over the piriformis muscles, more on the left..  Neurologic Exam  Mental status:  The patient is alert and oriented x 3 at the time of the examination. The patient has apparent normal recent and remote memory, with a mildly reduced attention span and concentration ability.     Cranial nerves: Extraocular movements are full.  Facial symmetry is present. Facial strength and sensation is normal. Trapezius strength is strong. No dysarthria is noted.   No obvious hearing deficits are noted.  Motor:  Muscle bulk is normal.   Muscle tone is mildly increased in legs. Strength is 5/5.Marland Kitchen   Sensory: She has normal sensation in her arms. She reports reduced sensation to vibration in the legs.   Coordination: Cerebellar testing reveals good finger-nose-finger bilaterally.  Gait and station: Station is normal.   Her gait is near normal now. Tandem gait is still wide. Romberg is negative.   Reflexes: Deep tendon reflexes are symmetric and increased bilaterally with 2 beats clonue at the ankles.      DIAGNOSTIC DATA (LABS, IMAGING, TESTING) - I reviewed patient records, labs, notes, testing and imaging myself where available.       ASSESSMENT AND PLAN  Multiple sclerosis (HCC) - Plan: Stratify JCV Antibody Test ([facility]), CBC with Differential/Platelet  Lumbosacral radiculopathy at S1  Left sided  sciatica  Other fatigue  Gait disorder  Dysesthesia  Urinary frequency  Abnormal weight loss  Other chronic pain   1.   Continue Tysabri every 4 weeks. She will get the infusions closer to home in [place].   2.   Due to her continued weight loss, we will stop the Adderall and switch to Provigil which should have less of an appetite suppressant activity. 3.    Continue Lamotrigine bid  4.    rtc 4 months, sooner if problems and sooner for Botox   Sevana Grandinetti A. Epimenio Foot, MD, PhD 07/26/2017, 3:22 PM Certified in Neurology, Clinical Neurophysiology, Sleep Medicine, Pain Medicine and Neuroimaging  [facility] [address] [place], [place] [address] [phone]

## 2017-07-27 LAB — CBC WITH DIFFERENTIAL/PLATELET
BASOS: 0 %
Basophils Absolute: 0 10*3/uL (ref 0.0–0.2)
EOS (ABSOLUTE): 0.1 10*3/uL (ref 0.0–0.4)
EOS: 2 %
HEMATOCRIT: 41.5 % (ref 34.0–46.6)
Hemoglobin: 13.9 g/dL (ref 11.1–15.9)
IMMATURE GRANULOCYTES: 0 %
Immature Grans (Abs): 0 10*3/uL (ref 0.0–0.1)
LYMPHS ABS: 3.2 10*3/uL — AB (ref 0.7–3.1)
Lymphs: 36 %
MCH: 30.1 pg (ref 26.6–33.0)
MCHC: 33.5 g/dL (ref 31.5–35.7)
MCV: 90 fL (ref 79–97)
MONOS ABS: 0.7 10*3/uL (ref 0.1–0.9)
Monocytes: 8 %
NEUTROS ABS: 4.8 10*3/uL (ref 1.4–7.0)
Neutrophils: 54 %
PLATELETS: 263 10*3/uL (ref 150–379)
RBC: 4.62 x10E6/uL (ref 3.77–5.28)
RDW: 13.9 % (ref 12.3–15.4)
WBC: 8.9 10*3/uL (ref 3.4–10.8)

## 2017-07-28 ENCOUNTER — Telehealth: Payer: Self-pay | Admitting: Neurology

## 2017-07-28 MED ORDER — MODAFINIL 200 MG PO TABS: 200.0000 mg | ORAL_TABLET | Freq: Every day | ORAL | 5 refills | Status: DC

## 2017-07-28 NOTE — Telephone Encounter (Signed)
Spoke with Sabrina [name] this morning.  She sts. pharmacy is not able to fill Modafinil rx. b/c it says dispense as written.  New rx., without dispense as written, faxed to [facility] per her request/fim

## 2017-07-28 NOTE — Telephone Encounter (Signed)
Pt calling stating she has been advised by the pharmacy that pt's insurance will not process the prescription because of the way in which it was written, pt states it has to do with the generic not being mentioned.  Please call pt.  Pharmacy used was [facility] [id number] - [place], [place] - [address] AT [facility] [address] [phone] (Phone) [phone] (Fax)   Please call pt

## 2017-07-28 NOTE — Addendum Note (Signed)
Addended by: Candis Schatz I on: 07/28/2017 11:48 AM   Modules accepted: Orders

## 2017-08-01 NOTE — Telephone Encounter (Signed)
Pt called said RX needs PA. Otherwise it will cost her $800.

## 2017-08-02 NOTE — Telephone Encounter (Signed)
Spoke with Francee Piccolo at [facility] and requested to initiate a PA for Modafinil 200mg  tablets #30/30, but was told that the pharmacy or member must first fax [facility] proof that Medicare has already been billed.  I have spoken with Savina's husband and explained this to him.  He will contact their retail pharmacy to see if they can do this/fim

## 2017-08-02 NOTE — Telephone Encounter (Signed)
Pt needs modafinil

## 2017-08-07 ENCOUNTER — Telehealth: Payer: Self-pay

## 2017-08-07 NOTE — Telephone Encounter (Signed)
JCV lab negative, 0.16

## 2017-08-07 NOTE — Telephone Encounter (Signed)
Pt's husband called back said he just spoke with [facility] and medication needs PA thru [facility].

## 2017-08-07 NOTE — Telephone Encounter (Signed)
Pts husband(on DPR) is requesting a call back to discuss the hold up with the medication. He cant get a hold of anyone with [facility] please call back to help. [employer] is saying they need proof that medicare has been billed from  [facility] [id number] - [place], [place] - [address] AT [facility] [address] [phone] (Phone) [phone] (Fax)  But [facility] is saying that they don't do that so pts husband is stuck on what to do

## 2017-08-08 NOTE — Telephone Encounter (Signed)
Submitted PA via cover my meds, will recheck status later today.

## 2017-08-08 NOTE — Telephone Encounter (Signed)
I called patient b/c I got a response back from [facility] that we needed to go through her Part D plan but [employer] could not find her in the system. I found out from patient that her Part D plan goes through [employer]. The number for PA's is [phone].

## 2017-08-08 NOTE — Telephone Encounter (Signed)
Pt husband(on DPR) has called re: the status of pt's medication being processed thru [employer].  Her is asking for a call when CMA Joni Reining is available to provide and update

## 2017-08-09 NOTE — Telephone Encounter (Signed)
APPROVED. Effective 07/11/2017-08/09/2018. Patient is aware.

## 2017-08-09 NOTE — Telephone Encounter (Signed)
Expedited PA submitted with [employer] Part [employer]. Awaiting response - determination can take up to 24hrs.

## 2017-08-17 DIAGNOSIS — Z881 Allergy status to other antibiotic agents status: Secondary | ICD-10-CM | POA: Diagnosis not present

## 2017-08-17 DIAGNOSIS — Z880 Allergy status to penicillin: Secondary | ICD-10-CM | POA: Diagnosis not present

## 2017-08-17 DIAGNOSIS — Z882 Allergy status to sulfonamides status: Secondary | ICD-10-CM | POA: Diagnosis not present

## 2017-08-17 DIAGNOSIS — Z885 Allergy status to narcotic agent status: Secondary | ICD-10-CM | POA: Diagnosis not present

## 2017-08-17 DIAGNOSIS — G35 Multiple sclerosis: Secondary | ICD-10-CM | POA: Diagnosis not present

## 2017-09-05 ENCOUNTER — Telehealth: Payer: Self-pay | Admitting: Neurology

## 2017-09-05 MED ORDER — HYDROCODONE-ACETAMINOPHEN 10-325 MG PO TABS: 1.0000 | ORAL_TABLET | Freq: Two times a day (BID) | ORAL | 0 refills | Status: DC | PRN

## 2017-09-05 NOTE — Telephone Encounter (Signed)
Rx last filled on 08/12/17 and next OV is 11/23/17. Rx printed, awaiting sig.

## 2017-09-05 NOTE — Telephone Encounter (Signed)
Patient requesting refill of  HYDROcodone-acetaminophen (NORCO) 10-325 MG tablet . ° ° °

## 2017-09-05 NOTE — Telephone Encounter (Signed)
Rx signed and at the front 

## 2017-09-05 NOTE — Addendum Note (Signed)
Addended by: Rocky Link A on: 09/05/2017 01:32 PM   Modules accepted: Orders

## 2017-09-10 ENCOUNTER — Other Ambulatory Visit: Payer: Self-pay | Admitting: Neurology

## 2017-09-14 ENCOUNTER — Telehealth: Payer: Self-pay | Admitting: Neurology

## 2017-09-14 NOTE — Telephone Encounter (Signed)
I checked with [facility].  Pt. last filled Clonazepam on 08/12/17.  I did give the pharmacist a verbal ok to go ahead and fill Clonazepam now. I have spoken with pt's husband and explained rx. can be filled today./fim

## 2017-09-14 NOTE — Telephone Encounter (Signed)
Pts husband called wanting to know why clonazePAM (KLONOPIN) 0.5 MG tablet cannot be refilled til 3/19 but pt ran out of tablets "a few days ago" please call to situate. Sent to [facility] [id number] - [place], [place] - [address] AT [facility] [address]

## 2017-09-18 DIAGNOSIS — G35 Multiple sclerosis: Secondary | ICD-10-CM | POA: Diagnosis not present

## 2017-10-03 ENCOUNTER — Telehealth: Payer: Self-pay | Admitting: Neurology

## 2017-10-03 MED ORDER — HYDROCODONE-ACETAMINOPHEN 10-325 MG PO TABS: 1.0000 | ORAL_TABLET | Freq: Two times a day (BID) | ORAL | 0 refills | Status: DC | PRN

## 2017-10-03 MED ORDER — CLONAZEPAM 0.5 MG PO TABS: 0.5000 mg | ORAL_TABLET | Freq: Three times a day (TID) | ORAL | 0 refills | Status: DC | PRN

## 2017-10-03 NOTE — Addendum Note (Signed)
Addended by: Candis Schatz I on: 10/03/2017 01:43 PM   Modules accepted: Orders

## 2017-10-03 NOTE — Telephone Encounter (Signed)
Rx's awaiting RAS sig/fim 

## 2017-10-03 NOTE — Telephone Encounter (Signed)
Rx's up front [facility]/fim 

## 2017-10-03 NOTE — Telephone Encounter (Signed)
Pt calling for refill prescription for clonazePAM (KLONOPIN) 0.5 MG tablet [facility] [id number] - [place], [place] - [address] AT [facility] [address] [phone] (Phone) [phone] (Fax)     )and  HYDROcodone-acetaminophen (NORCO) 10-325 MG tablet

## 2017-10-23 DIAGNOSIS — G35 Multiple sclerosis: Secondary | ICD-10-CM | POA: Diagnosis not present

## 2017-11-01 ENCOUNTER — Telehealth: Payer: Self-pay | Admitting: Neurology

## 2017-11-01 MED ORDER — HYDROCODONE-ACETAMINOPHEN 10-325 MG PO TABS: 1.0000 | ORAL_TABLET | Freq: Two times a day (BID) | ORAL | 0 refills | Status: DC | PRN

## 2017-11-01 NOTE — Telephone Encounter (Signed)
Patient requesting refill of  HYDROcodone-acetaminophen (NORCO) 10-325 MG tablet . ° ° °

## 2017-11-01 NOTE — Telephone Encounter (Signed)
Rx. up front [facility]/fim 

## 2017-11-01 NOTE — Telephone Encounter (Signed)
Rx. awaiting RAS sig/fim 

## 2017-11-01 NOTE — Addendum Note (Signed)
Addended by: Candis Schatz I on: 11/01/2017 03:57 PM   Modules accepted: Orders

## 2017-11-07 ENCOUNTER — Other Ambulatory Visit: Payer: Self-pay | Admitting: Neurology

## 2017-11-14 ENCOUNTER — Encounter: Payer: Self-pay | Admitting: *Deleted

## 2017-11-20 ENCOUNTER — Telehealth: Payer: Self-pay | Admitting: Neurology

## 2017-11-20 MED ORDER — CLONAZEPAM 0.5 MG PO TABS: 0.5000 mg | ORAL_TABLET | Freq: Three times a day (TID) | ORAL | 0 refills | Status: DC | PRN

## 2017-11-20 NOTE — Telephone Encounter (Signed)
Rx. awaiting RAS sig/fim 

## 2017-11-20 NOTE — Telephone Encounter (Signed)
Pt requesting a refill for clonazePAM (KLONOPIN) 0.5 MG tablet sent to [facility] on [address]

## 2017-11-20 NOTE — Telephone Encounter (Signed)
Rx. [facility]ed to [facility] [address] as requested/fim

## 2017-11-22 DIAGNOSIS — G35 Multiple sclerosis: Secondary | ICD-10-CM | POA: Diagnosis not present

## 2017-11-23 ENCOUNTER — Ambulatory Visit (INDEPENDENT_AMBULATORY_CARE_PROVIDER_SITE_OTHER): Payer: Medicare Other | Admitting: Neurology

## 2017-11-23 ENCOUNTER — Encounter: Payer: Self-pay | Admitting: Neurology

## 2017-11-23 VITALS — BP 106/67 | HR 75 | Ht 64.0 in | Wt 81.0 lb

## 2017-11-23 DIAGNOSIS — M5432 Sciatica, left side: Secondary | ICD-10-CM | POA: Diagnosis not present

## 2017-11-23 DIAGNOSIS — R269 Unspecified abnormalities of gait and mobility: Secondary | ICD-10-CM | POA: Diagnosis not present

## 2017-11-23 DIAGNOSIS — R634 Abnormal weight loss: Secondary | ICD-10-CM | POA: Diagnosis not present

## 2017-11-23 DIAGNOSIS — R350 Frequency of micturition: Secondary | ICD-10-CM

## 2017-11-23 DIAGNOSIS — R208 Other disturbances of skin sensation: Secondary | ICD-10-CM | POA: Diagnosis not present

## 2017-11-23 DIAGNOSIS — R5383 Other fatigue: Secondary | ICD-10-CM

## 2017-11-23 DIAGNOSIS — G35 Multiple sclerosis: Secondary | ICD-10-CM | POA: Diagnosis not present

## 2017-11-23 MED ORDER — MODAFINIL 200 MG PO TABS: 200.0000 mg | ORAL_TABLET | Freq: Every day | ORAL | 5 refills | Status: DC

## 2017-11-23 MED ORDER — CLONAZEPAM 0.5 MG PO TABS: 0.5000 mg | ORAL_TABLET | Freq: Three times a day (TID) | ORAL | 0 refills | Status: DC | PRN

## 2017-11-23 MED ORDER — OXYCODONE-ACETAMINOPHEN 5-325 MG PO TABS
ORAL_TABLET | ORAL | 0 refills | Status: DC
Start: 2017-11-23 — End: 2018-01-01

## 2017-11-23 NOTE — Progress Notes (Signed)
u  [facility]  PATIENT: Sabrina [name] DOB: March 15, 1981    _________________________________   HISTORICAL  CHIEF COMPLAINT:  Chief Complaint  Patient presents with  . Follow-up    Rm 13, father with pt.  . Multiple Sclerosis    Tysabri last infusion 11-22-17, increased pain in Leg and hips.  Needs refill of norco clonazepam.  Modafinil working ok per pt.     HISTORY OF PRESENT ILLNESS:  Sabrina [name] is a 37 year old woman with multiple sclerosis and chronic pain.       Update 11/23/2017: Her MS has been stable with no new exacerbations.   She is on Tysabri and she tolerates it well.    She does her infusions in [place], las tone yesterday.    Hr JCV ws negative at 0.27 July 2017.   Neurologically she is doing well.  Her gait is actually better than it was a couple years ago and she no longer needs to use a cane.  Her stride is good though she still feels a little off balance at times.  She denies any significant weakness or numbness in the limbs.  She has some urinary urgency with occasional incontinence.  Vision is fine.  Her fatigue has done as well on Provigil as I did on Adderall.  She continues to have difficulty with her weight and weights only 82 pounds.   She eats and wish she would gain weight.  She is off the Adderall.     She repots a lot of pain, especially in the left hip and buttock.   Sometimes the right hip hurts more when she puts weight on it.       Update 07/26/2017: She is doing her monthly Tysabri in [place]. She tolerates the infusions well. She has done much better on Tysabri than on Copaxone. She is JCV antibody negative. The last test was 10/17/2016 and was negative and 0.17. Imbalance is unchanged.    She has had a couple falls with tripping but is no worse..  . She is able to walk without a cane but will sometimes use a cane or walker if she feels weaker or more tired. He has painful dysesthesias in her legs.    She has mildly reduced  vision out of the right eye. She has urinary frequency and urgency. This is fairly stable and she has rare night time incontinence.     He continues to report fatigue.  She has been prescribed Adderall but does not take on a regular basis. Her weight is a little lower than the last visit at only 86 pounds. Megace had not helped her weight gain. Sleep is variable and she still has some trouble falling asleep and staying asleep. She notes some depression but feels better currently than much of last year. She notes some decreased focus and attention, also helped by Adderall when she takes it.  She has bilateral sciatica with pain in the buttocks radiating to the legs. Trigger point injections have helped (piriformis).   Lumbar MRI was ok.   Lamotrigine helps some.     Hydrocodone helps the pain.      From 02/28/2017: She went to [facility] last week after noting more fogginess and having more pain and weakness in her legs, especially the right leg.   She fell down in the seat and couch having trouble getting up.   She was more emotional and foggy headed.   She went to [facility] ED and had MRI's.  The brain looks the same.     She has multiple thoracic spine lesions and one at C6C7 looked expansile but did not enhance (unclear if subacute or not).   She had several days of IV Steroids.    She is now feeling back to her baseline. I personally reviewed the MRI of the brain and MRI of the thoracic spine performed during her recent hospitalization. The MRI of the brain shows many T2/FLAIR hyperintense foci in the periventricular, juxtacortical and deep white matter. Her to be any change when compared to her previous MRI. The MRI of the thoracic spine shows multiple T2 hyperintense lesions. The T6-T7 lesion is fairly large and there could be slight expansion of the spinal cord. However, there is no enhancement.      MS:   She continues to do monthly Tysabri (in [place]) since 2016.   She is tolerating the  infusions well.   This is her first possible exacerbations since starting Tysabri. She has done much better on Tysabri that she did on Copaxone.  Gait/strength/sensation/pain:   Currently her gait is back to baseline. She is able to walk without a cane most of the time. However, when pain accepts uses cane or walker. Mild weakness in her legs. There are some painful dysesthesias in the legs.  Vision:      She notes no change in the right blurry vision.    She denies any diplopia.  Sciatica/pain:  She continues to experience sciatic type pain in the back and left leg. In the past, piriformis injections have helped her. Pain is not too bad since she received several days of steroids to the hospital.  In the past, she received the best benefit with Botox but her co-pay was too high.  Other med's including gabapentin, Lyrica and Tegretol are not well tolerated.  Lamictal helps the pain some.      Lumbar MRI was normal.      Bladder:  She notes stable urinary frequency and urgency and has nocturnal incontinence once a month.  She has 1 - 2 times nocturia and sometimes has nocturnal incontinence.   In the past she was on oxybutynin with benefit.  She denies hesitrancy.   Fatigue/sleep:   She has fatigue is physical more than mental. Adderall has helped her fatigue quite a bit. .  Even though she has weight loss, she feels appetite is not suppressed.    Sometimes, she will take only one Adderall pill and occasionally does not take any..       She feels she sleeps much better now than last year and RLS/PLMS is better.    Mood/cognition:   Depression and anxiety are doing a little bit better this visit than last visit..  She notes stress with family and the holidays.   She has had some panic attacks and clonazepam helps the most.   She notes mild cognitive issues with poor focus and attention and reduced verbal fluency.    She is on Pristiq and lamotrigine with benefit  MS History:  She had right optic neuritis  in December 2012 and had an MRI of the brain showing optic nerve inflammation and many white matter spots.    She saw Dr. Macon Large in [place].   She was started on Copaxone.  She likely had an exacerbation in 2013 when she had trouble walking x 2 months.   She received a few days of IV Solu-Medrol.  Last year, she also received a few days  of IV Solu-Medrol but she is uncertain   She felt she did well for the first 2 years but switched to Gilenya because she was tired of the shots last year.   At first, she tolerated it well but then her depression got much worse and she had some stomach issues so she returned to Copaxone last month (20 mg daily).   MRI early 2016 showed multiple old MS plaques and 1  Enhancing focus in the right frontal lobe prompting change to Tysabi.     REVIEW OF SYSTEMS: Constitutional: No fevers, chills, sweats, or change in appetite.  Notes a lot of fatigue Eyes: No visual changes, double vision, eye pain Ear, nose and throat: No hearing loss, ear pain, nasal congestion, sore throat Cardiovascular: No chest pain, palpitations Respiratory: No shortness of breath at rest or with exertion.   No wheezes GastrointestinaI: No nausea, vomiting, diarrhea, abdominal pain, fecal incontinence Genitourinary: as above. Musculoskeletal: No neck pain, back pain Integumentary: No rash, pruritus, skin lesions Neurological: as above Psychiatric: Notes some depression and anxiety Endocrine: No palpitations, diaphoresis, change in appetite, change in weigh or increased thirst Hematologic/Lymphatic: No anemia, purpura, petechiae. Allergic/Immunologic: No itchy/runny eyes, nasal congestion, recent allergic reactions, rashes  ALLERGIES: Allergies  Allergen Reactions  . Amoxicillin Anaphylaxis  . Penicillins Anaphylaxis  . Sulfa Antibiotics Other (See Comments)    unknown  . Codeine Nausea And Vomiting    HOME MEDICATIONS:  Current Outpatient Medications:  .  baclofen (LIORESAL) 10  MG tablet, Take 1 tablet (10 mg total) by mouth 3 (three) times daily., Disp: 90 each, Rfl: 3 .  clonazePAM (KLONOPIN) 0.5 MG tablet, Take 1 tablet (0.5 mg total) by mouth 3 (three) times daily as needed. for anxiety, Disp: 60 tablet, Rfl: 0 .  DULoxetine (CYMBALTA) 60 MG capsule, TAKE 1 CAPSULE(60 MG) BY MOUTH DAILY, Disp: 90 capsule, Rfl: 3 .  feeding supplement, ENSURE ENLIVE, (ENSURE ENLIVE) LIQD, Take 237 mLs by mouth 2 (two) times daily between meals., Disp: 237 mL, Rfl: 12 .  lamoTRIgine (LAMICTAL) 150 MG tablet, Take 1 tablet (150 mg total) by mouth 2 (two) times daily., Disp: 60 tablet, Rfl: 11 .  megestrol (MEGACE) 40 MG tablet, TAKE 1 TABLET(40 MG) BY MOUTH DAILY, Disp: 30 tablet, Rfl: 0 .  modafinil (PROVIGIL) 200 MG tablet, Take 1 tablet (200 mg total) by mouth daily., Disp: 30 tablet, Rfl: 5 .  natalizumab (TYSABRI) 300 MG/15ML injection, Inject 15 mLs (300 mg total) every 30 (thirty) days into the vein., Disp: 15 mL, Rfl: 12 .  oxybutynin (DITROPAN) 5 MG tablet, Take 1 tablet (5 mg total) by mouth 2 (two) times daily as needed for bladder spasms., Disp: 60 tablet, Rfl: 11 .  pantoprazole (PROTONIX) 40 MG tablet, Take 1 tablet (40 mg total) by mouth 2 (two) times daily., Disp: 60 tablet, Rfl: 0 .  senna (SENOKOT) 8.6 MG TABS tablet, Take 1 tablet (8.6 mg total) by mouth daily., Disp: 120 each, Rfl: 0 .  oxyCODONE-acetaminophen (PERCOCET/ROXICET) 5-325 MG tablet, One po tid prn, Disp: 90 tablet, Rfl: 0  PAST MEDICAL HISTORY: Past Medical History:  Diagnosis Date  . Chronic pain syndrome   . Depression   . Fibromyalgia   . GAD (generalized anxiety disorder)   . Headache   . Multiple sclerosis (HCC) 10/28/2014  . Obsessive compulsive disorder   . Vision abnormalities     PAST SURGICAL HISTORY: Past Surgical History:  Procedure Laterality Date  . NO PAST SURGERIES  FAMILY HISTORY: Family History  Problem Relation Age of Onset  . Healthy Mother   . Diabetes type II  Father   . Prostate cancer Father     SOCIAL HISTORY:  Social History   Socioeconomic History  . Marital status: Married    Spouse name: Not on file  . Number of children: Not on file  . Years of education: Not on file  . Highest education level: Not on file  Occupational History  . Not on file  Social Needs  . Financial resource strain: Not on file  . Food insecurity:    Worry: Not on file    Inability: Not on file  . Transportation needs:    Medical: Not on file    Non-medical: Not on file  Tobacco Use  . Smoking status: Current Every Day Smoker    Packs/day: 0.50    Types: Cigarettes  . Smokeless tobacco: Never Used  Substance and Sexual Activity  . Alcohol use: No    Alcohol/week: 0.0 oz  . Drug use: No  . Sexual activity: Not on file  Lifestyle  . Physical activity:    Days per week: Not on file    Minutes per session: Not on file  . Stress: Not on file  Relationships  . Social connections:    Talks on phone: Not on file    Gets together: Not on file    Attends religious service: Not on file    Active member of club or organization: Not on file    Attends meetings of clubs or organizations: Not on file    Relationship status: Not on file  . Intimate partner violence:    Fear of current or ex partner: Not on file    Emotionally abused: Not on file    Physically abused: Not on file    Forced sexual activity: Not on file  Other Topics Concern  . Not on file  Social History Narrative  . Not on file     PHYSICAL EXAM  Vitals:   11/23/17 1119  BP: 106/67  Pulse: 75  Weight: 81 lb (36.7 kg)  Height: 5\' 4"  (1.626 m)    Body mass index is 13.9 kg/m.   General: The patient is well-developed and well-nourished and in no acute distress  Musculoskeletal:   There is mild tenderness over the piriformis muscles, more on the left..   She has poor posture while sitting  Neurologic Exam  Mental status:  The patient is alert and oriented x 3 at the time  of the examination. The patient has apparent normal recent and remote memory, with a mildly reduced attention span and concentration ability.     Cranial nerves: Extraocular movements are full.  Facial strength and sensation is normal.  Trapezius strength is normal.  No dysarthria is noted.   No obvious hearing deficits are noted.  Motor:  Muscle bulk is normal.   Muscle tone is mildly increased in legs. Strength is 5/5.Marland Kitchen   Sensory: She has normal sensation in her arms. She reports reduced sensation to vibration in the legs.   Coordination: Cerebellar testing reveals good finger-nose-finger bilaterally.  Heel-to-shin is performed fairly well.  Gait and station: Station is normal.  The gait is near normal.  The tandem gait is wide.. Romberg is negative.   Reflexes: Deep tendon reflexes are symmetric and increased bilaterally with 2 beats clonue at the ankles.      DIAGNOSTIC DATA (LABS, IMAGING, TESTING) - I reviewed  patient records, labs, notes, testing and imaging myself where available.       ASSESSMENT AND PLAN  Multiple sclerosis (HCC)  Other fatigue  Left sided sciatica  Gait disorder  Dysesthesia  Urinary frequency  Abnormal weight loss   1.   She will continue the Tysabri infusions near her home in [place]..   2.   Continue Provigil for fatigue rather than Adderall.  Continue Megace. 3.   Continue other medications for pain and bladder.  I will switch the hydrocodone 10 mg twice a day to oxycodone 5 mg 3 times a day as she felt she did better with oxycodone in the past.   Clonazepam 0.5 mg twice a day for muscle spasms and anxiety 4.    rtc 4 months, or sooner if new or worsening neurologic symptoms  Roselee Tayloe A. Epimenio Foot, MD, PhD 11/23/2017, 2:21 PM Certified in Neurology, Clinical Neurophysiology, Sleep Medicine, Pain Medicine and Neuroimaging  [facility] [address] [place], [place] [address] [phone]

## 2017-12-26 DIAGNOSIS — G35 Multiple sclerosis: Secondary | ICD-10-CM | POA: Diagnosis not present

## 2017-12-27 ENCOUNTER — Telehealth: Payer: Self-pay | Admitting: Neurology

## 2017-12-27 NOTE — Telephone Encounter (Signed)
Spoke with husband who sts. pt. is having more lower back, bilat leg pain, not helped much by Hydrocodone. Had a normal MRI L-spine in 2016.  Appt. with RAS moved to 01/01/18/fim

## 2017-12-27 NOTE — Telephone Encounter (Signed)
Pt husband(on DPR) has called to inform pt is having a lot of leg and bottom pain. Husband states pain has been for a couple of days now. Husband would like to know if a steroid could be called in.  Please call

## 2018-01-01 ENCOUNTER — Ambulatory Visit (INDEPENDENT_AMBULATORY_CARE_PROVIDER_SITE_OTHER): Payer: Medicare Other | Admitting: Neurology

## 2018-01-01 ENCOUNTER — Encounter: Payer: Self-pay | Admitting: Neurology

## 2018-01-01 VITALS — BP 104/66 | HR 88 | Resp 16 | Ht 64.0 in | Wt 88.0 lb

## 2018-01-01 DIAGNOSIS — R5383 Other fatigue: Secondary | ICD-10-CM | POA: Diagnosis not present

## 2018-01-01 DIAGNOSIS — M5432 Sciatica, left side: Secondary | ICD-10-CM | POA: Diagnosis not present

## 2018-01-01 DIAGNOSIS — R208 Other disturbances of skin sensation: Secondary | ICD-10-CM | POA: Diagnosis not present

## 2018-01-01 DIAGNOSIS — R269 Unspecified abnormalities of gait and mobility: Secondary | ICD-10-CM | POA: Diagnosis not present

## 2018-01-01 DIAGNOSIS — G35 Multiple sclerosis: Secondary | ICD-10-CM

## 2018-01-01 DIAGNOSIS — R634 Abnormal weight loss: Secondary | ICD-10-CM

## 2018-01-01 DIAGNOSIS — G8929 Other chronic pain: Secondary | ICD-10-CM

## 2018-01-01 MED ORDER — MEGESTROL ACETATE 40 MG PO TABS: 40.0000 mg | ORAL_TABLET | Freq: Every day | ORAL | 0 refills | Status: DC

## 2018-01-01 MED ORDER — OXYCODONE-ACETAMINOPHEN 5-325 MG PO TABS: ORAL_TABLET | ORAL | 0 refills | Status: DC

## 2018-01-01 MED ORDER — ARMODAFINIL 250 MG PO TABS
250.0000 mg | ORAL_TABLET | Freq: Every day | ORAL | 5 refills | Status: DC
Start: 2018-01-01 — End: 2018-01-02

## 2018-01-01 MED ORDER — CYCLOBENZAPRINE HCL 5 MG PO TABS: 5.0000 mg | ORAL_TABLET | Freq: Three times a day (TID) | ORAL | 5 refills | Status: DC | PRN

## 2018-01-01 NOTE — Progress Notes (Signed)
u  [facility]  PATIENT: Sabrina [name] DOB: 04-Aug-1980    _________________________________   HISTORICAL  CHIEF COMPLAINT:  Chief Complaint  Patient presents with  . Multiple Sclerosis    Sts. she continues to tolerate Tysabri well.  JCV ab last checked 07/26/17 and was negative at 0.16.  She is here today with c/o worsening lower back, left buttock pain radiating down inner left leg to foot, and increased fatigue.  Would like to discuss increasing Oxycodone, Cymbalta, Provigil, and changing from Baclofen to Flexeril/fim  . Back Pain    HISTORY OF PRESENT ILLNESS:  Sabrina [name] is a 37 year old woman with multiple sclerosis and chronic pain.     Update 01/01/2018: Her MS has been doing about the same.  Specifically, she has not had any exacerbations.  She is on Tysabri and she gets her infusions in [place].  Her JCV antibodies have been negative.  She still has some difficulties with her gait being mildly wide or balance at times.  She notes no major problems with focal weakness (does feel generalized weak).   She does have some difficulties with dysesthetic pain.  Cymbalta helped some when we added that at the last visit.    She is reporting more pain in her left lower back/buttock and down the left leg.    Her fatigue has been worse.    She gets some benefit from Provigil but the benefit seems less than it was in the past and wears off by early afternoon.  She has gained a few pounds.   Appetite is still poor.     Update 11/23/2017: Her MS has been stable with no new exacerbations.   She is on Tysabri and she tolerates it well.    She does her infusions in [place], last one yesterday.    Hr JCV ws negative at 0.27 July 2017.   Neurologically she is doing well.  Her gait is actually better than it was a couple years ago and she no longer needs to use a cane.  Her stride is good though she still feels a little off balance at times.  She denies any significant  weakness or numbness in the limbs.  She has some urinary urgency with occasional incontinence.  Vision is fine.  Her fatigue has done as well on Provigil as I did on Adderall.  She continues to have difficulty with her weight and weights only 82 pounds.   She eats and wish she would gain weight.  She is off the Adderall.     She repots a lot of pain, especially in the left hip and buttock.   Sometimes the right hip hurts more when she puts weight on it.     Currently, she is on oxycodone 5 mg p.o. 3 times daily.  Update 07/26/2017: She is doing her monthly Tysabri in [place]. She tolerates the infusions well. She has done much better on Tysabri than on Copaxone. She is JCV antibody negative. The last test was 10/17/2016 and was negative and 0.17. Imbalance is unchanged.    She has had a couple falls with tripping but is no worse..  . She is able to walk without a cane but will sometimes use a cane or walker if she feels weaker or more tired. He has painful dysesthesias in her legs.    She has mildly reduced vision out of the right eye. She has urinary frequency and urgency. This is fairly stable and she has rare night time  incontinence.     He continues to report fatigue.  She has been prescribed Adderall but does not take on a regular basis. Her weight is a little lower than the last visit at only 86 pounds. Megace had not helped her weight gain. Sleep is variable and she still has some trouble falling asleep and staying asleep. She notes some depression but feels better currently than much of last year. She notes some decreased focus and attention, also helped by Adderall when she takes it.  She has bilateral sciatica with pain in the buttocks radiating to the legs. Trigger point injections have helped (piriformis).   Lumbar MRI was ok.   Lamotrigine helps some.     Hydrocodone helps the pain.      From 02/28/2017: She went to [facility] last week after noting more fogginess and having more pain and  weakness in her legs, especially the right leg.   She fell down in the seat and couch having trouble getting up.   She was more emotional and foggy headed.   She went to [facility] ED and had MRI's.  The brain looks the same.     She has multiple thoracic spine lesions and one at C6C7 looked expansile but did not enhance (unclear if subacute or not).   She had several days of IV Steroids.    She is now feeling back to her baseline. I personally reviewed the MRI of the brain and MRI of the thoracic spine performed during her recent hospitalization. The MRI of the brain shows many T2/FLAIR hyperintense foci in the periventricular, juxtacortical and deep white matter. Her to be any change when compared to her previous MRI. The MRI of the thoracic spine shows multiple T2 hyperintense lesions. The T6-T7 lesion is fairly large and there could be slight expansion of the spinal cord. However, there is no enhancement.      MS:   She continues to do monthly Tysabri (in [place]) since 2016.   She is tolerating the infusions well.   This is her first possible exacerbations since starting Tysabri. She has done much better on Tysabri that she did on Copaxone.  Gait/strength/sensation/pain:   Currently her gait is back to baseline. She is able to walk without a cane most of the time. However, when pain accepts uses cane or walker. Mild weakness in her legs. There are some painful dysesthesias in the legs.  Vision:      She notes no change in the right blurry vision.    She denies any diplopia.  Sciatica/pain:  She continues to experience sciatic type pain in the back and left leg. In the past, piriformis injections have helped her. Pain is not too bad since she received several days of steroids to the hospital.  In the past, she received the best benefit with Botox but her co-pay was too high.  Other med's including gabapentin, Lyrica and Tegretol are not well tolerated.  Lamictal helps the pain some.      Lumbar MRI was  normal.      Bladder:  She notes stable urinary frequency and urgency and has nocturnal incontinence once a month.  She has 1 - 2 times nocturia and sometimes has nocturnal incontinence.   In the past she was on oxybutynin with benefit.  She denies hesitrancy.   Fatigue/sleep:   She has fatigue is physical more than mental. Adderall has helped her fatigue quite a bit. .  Even though she has weight  loss, she feels appetite is not suppressed.    Sometimes, she will take only one Adderall pill and occasionally does not take any..       She feels she sleeps much better now than last year and RLS/PLMS is better.    Mood/cognition:   Depression and anxiety are doing a little bit better this visit than last visit..  She notes stress with family and the holidays.   She has had some panic attacks and clonazepam helps the most.   She notes mild cognitive issues with poor focus and attention and reduced verbal fluency.    She is on Pristiq and lamotrigine with benefit  MS History:  She had right optic neuritis in December 2012 and had an MRI of the brain showing optic nerve inflammation and many white matter spots.    She saw Dr. Macon Large in [place].   She was started on Copaxone.  She likely had an exacerbation in 2013 when she had trouble walking x 2 months.   She received a few days of IV Solu-Medrol.  Last year, she also received a few days of IV Solu-Medrol but she is uncertain   She felt she did well for the first 2 years but switched to Gilenya because she was tired of the shots last year.   At first, she tolerated it well but then her depression got much worse and she had some stomach issues so she returned to Copaxone last month (20 mg daily).   MRI early 2016 showed multiple old MS plaques and 1  Enhancing focus in the right frontal lobe prompting change to Tysabi.     REVIEW OF SYSTEMS: Constitutional: No fevers, chills, sweats, or change in appetite.  Notes a lot of fatigue Eyes: No visual changes,  double vision, eye pain Ear, nose and throat: No hearing loss, ear pain, nasal congestion, sore throat Cardiovascular: No chest pain, palpitations Respiratory: No shortness of breath at rest or with exertion.   No wheezes GastrointestinaI: No nausea, vomiting, diarrhea, abdominal pain, fecal incontinence Genitourinary: as above. Musculoskeletal: No neck pain, back pain Integumentary: No rash, pruritus, skin lesions Neurological: as above Psychiatric: Notes some depression and anxiety Endocrine: No palpitations, diaphoresis, change in appetite, change in weigh or increased thirst Hematologic/Lymphatic: No anemia, purpura, petechiae. Allergic/Immunologic: No itchy/runny eyes, nasal congestion, recent allergic reactions, rashes  ALLERGIES: Allergies  Allergen Reactions  . Amoxicillin Anaphylaxis  . Penicillins Anaphylaxis  . Sulfa Antibiotics Other (See Comments)    unknown  . Codeine Nausea And Vomiting    HOME MEDICATIONS:  Current Outpatient Medications:  .  baclofen (LIORESAL) 10 MG tablet, Take 1 tablet (10 mg total) by mouth 3 (three) times daily., Disp: 90 each, Rfl: 3 .  clonazePAM (KLONOPIN) 0.5 MG tablet, Take 1 tablet (0.5 mg total) by mouth 3 (three) times daily as needed. for anxiety, Disp: 60 tablet, Rfl: 0 .  DULoxetine (CYMBALTA) 60 MG capsule, TAKE 1 CAPSULE(60 MG) BY MOUTH DAILY, Disp: 90 capsule, Rfl: 3 .  feeding supplement, ENSURE ENLIVE, (ENSURE ENLIVE) LIQD, Take 237 mLs by mouth 2 (two) times daily between meals., Disp: 237 mL, Rfl: 12 .  lamoTRIgine (LAMICTAL) 150 MG tablet, Take 1 tablet (150 mg total) by mouth 2 (two) times daily., Disp: 60 tablet, Rfl: 11 .  megestrol (MEGACE) 40 MG tablet, TAKE 1 TABLET(40 MG) BY MOUTH DAILY, Disp: 30 tablet, Rfl: 0 .  natalizumab (TYSABRI) 300 MG/15ML injection, Inject 15 mLs (300 mg total) every 30 (thirty) days  into the vein., Disp: 15 mL, Rfl: 12 .  oxybutynin (DITROPAN) 5 MG tablet, Take 1 tablet (5 mg total) by  mouth 2 (two) times daily as needed for bladder spasms., Disp: 60 tablet, Rfl: 11 .  oxyCODONE-acetaminophen (PERCOCET/ROXICET) 5-325 MG tablet, One po tid prn, Disp: 90 tablet, Rfl: 0 .  ranitidine (ZANTAC) 150 MG tablet, Take 150 mg by mouth 2 (two) times daily., Disp: , Rfl:   PAST MEDICAL HISTORY: Past Medical History:  Diagnosis Date  . Chronic pain syndrome   . Depression   . Fibromyalgia   . GAD (generalized anxiety disorder)   . Headache   . Multiple sclerosis (HCC) 10/28/2014  . Obsessive compulsive disorder   . Vision abnormalities     PAST SURGICAL HISTORY: Past Surgical History:  Procedure Laterality Date  . NO PAST SURGERIES      FAMILY HISTORY: Family History  Problem Relation Age of Onset  . Healthy Mother   . Diabetes type II Father   . Prostate cancer Father     SOCIAL HISTORY:  Social History   Socioeconomic History  . Marital status: Married    Spouse name: Not on file  . Number of children: Not on file  . Years of education: Not on file  . Highest education level: Not on file  Occupational History  . Not on file  Social Needs  . Financial resource strain: Not on file  . Food insecurity:    Worry: Not on file    Inability: Not on file  . Transportation needs:    Medical: Not on file    Non-medical: Not on file  Tobacco Use  . Smoking status: Current Every Day Smoker    Packs/day: 0.50    Types: Cigarettes  . Smokeless tobacco: Never Used  Substance and Sexual Activity  . Alcohol use: No    Alcohol/week: 0.0 oz  . Drug use: No  . Sexual activity: Not on file  Lifestyle  . Physical activity:    Days per week: Not on file    Minutes per session: Not on file  . Stress: Not on file  Relationships  . Social connections:    Talks on phone: Not on file    Gets together: Not on file    Attends religious service: Not on file    Active member of club or organization: Not on file    Attends meetings of clubs or organizations: Not on file      Relationship status: Not on file  . Intimate partner violence:    Fear of current or ex partner: Not on file    Emotionally abused: Not on file    Physically abused: Not on file    Forced sexual activity: Not on file  Other Topics Concern  . Not on file  Social History Narrative  . Not on file     PHYSICAL EXAM  Vitals:   01/01/18 1311  BP: 104/66  Pulse: 88  Resp: 16  Weight: 88 lb (39.9 kg)  Height: 5\' 4"  (1.626 m)    Body mass index is 15.11 kg/m.   General: The patient is well-developed and well-nourished and in no acute distress  Musculoskeletal:   There is mild tenderness over the piriformis muscles, more on the left..   She has poor posture while sitting  Neurologic Exam  Mental status:  The patient is alert and oriented x 3 at the time of the examination. The patient has apparent normal recent and  remote memory, with a mildly reduced attention span and concentration ability.     Cranial nerves: Extraocular movements are full.  Facial strength and sensation is normal.  Trapezius strength is normal.  No obvious hearing deficits are noted.  Motor:  Muscle bulk is normal.   Muscle tone is mildly increased in legs. Strength is 5/5.Marland Kitchen   Sensory: She has normal sensation in her arms. She reports reduced sensation to vibration in the legs.   Coordination: Cerebellar testing reveals good finger-nose-finger bilaterally.  She has mildly reduced left heel-to-shin.  Well.  Gait and station: Station is normal.  The gait is near normal.  The tandem gait is wide.. Romberg is negative.   Reflexes: Deep tendon reflexes are symmetric and increased bilaterally with 2 beats clonue at the ankles.      DIAGNOSTIC DATA (LABS, IMAGING, TESTING) - I reviewed patient records, labs, notes, testing and imaging myself where available.       ASSESSMENT AND PLAN  Multiple sclerosis (HCC)  Left sided sciatica  Other fatigue  Gait disorder  Dysesthesia  Abnormal weight  loss  Other chronic pain   1.   Continue Tysabri infusions in [place].  We will check a JCV antibody today.  Er home in [place]..   2.   Change Provigil to Nuvigil 250 mg. 3.  Continue clonazepam for anxiety and spasms.  Oxycodone 5 mg p.o. 4 times daily.  We discussed that if this does not help her enough that she would need to go to pain management clinic.   4.    rtc 4 months, or sooner if new or worsening neurologic symptoms  Richard A. Epimenio Foot, MD, PhD 01/01/2018, 1:40 PM Certified in Neurology, Clinical Neurophysiology, Sleep Medicine, Pain Medicine and Neuroimaging  [facility] [address] [place], [place] [address] [phone]

## 2018-01-02 ENCOUNTER — Telehealth: Payer: Self-pay | Admitting: Neurology

## 2018-01-02 LAB — CBC WITH DIFFERENTIAL/PLATELET
BASOS ABS: 0 10*3/uL (ref 0.0–0.2)
BASOS: 0 %
EOS (ABSOLUTE): 0.2 10*3/uL (ref 0.0–0.4)
Eos: 2 %
Hematocrit: 40.3 % (ref 34.0–46.6)
Hemoglobin: 13.8 g/dL (ref 11.1–15.9)
Immature Grans (Abs): 0.1 10*3/uL (ref 0.0–0.1)
Immature Granulocytes: 1 %
LYMPHS: 36 %
Lymphocytes Absolute: 3.5 10*3/uL — ABNORMAL HIGH (ref 0.7–3.1)
MCH: 30.6 pg (ref 26.6–33.0)
MCHC: 34.2 g/dL (ref 31.5–35.7)
MCV: 89 fL (ref 79–97)
MONOCYTES: 9 %
MONOS ABS: 0.9 10*3/uL (ref 0.1–0.9)
NEUTROS ABS: 5 10*3/uL (ref 1.4–7.0)
NEUTROS PCT: 52 %
PLATELETS: 263 10*3/uL (ref 150–450)
RBC: 4.51 x10E6/uL (ref 3.77–5.28)
RDW: 13.9 % (ref 12.3–15.4)
WBC: 9.6 10*3/uL (ref 3.4–10.8)

## 2018-01-02 NOTE — Addendum Note (Signed)
Addended by: Candis Schatz I on: 01/02/2018 12:12 PM   Modules accepted: Orders

## 2018-01-02 NOTE — Telephone Encounter (Signed)
Spoke with Leonette Most and explained that I have completed the PA for Armodafinil 250mg , but pt's ins. plan only covers it for narcolepsy, shift work sleep disorder, or sleep apnea, so I anticipate a denial of coverage.  Armodafinil 250mg  is fairly reasonable, at [facility] the cash price for #30 tablets is $38.52.  He verbalized understanding of same, sts. Julisa would like to cancel the Armodafinil rx. and go back to Modafinil 200mg  qd.  I spoke with Gerilyn Pilgrim at [facility] in [place] and cancelled the Armodafinil rx., advised pt. will be coming to pick up the r/f of Modafinil she had on file/fim

## 2018-01-02 NOTE — Telephone Encounter (Signed)
Patient calling stating [facility] on [address] in [place] needs PA for Armodafinil 250 MG tablet.

## 2018-01-02 NOTE — Telephone Encounter (Signed)
PA for Armodafinil 250mg  tablets #30/30 completed with [employer], via Cover My Meds.  Key# [id number].Sabrina [name]

## 2018-01-02 NOTE — Telephone Encounter (Signed)
Fax received from [employer], phone# [phone].  Armodafinil denied under Medicare Part D because pt. does not have OSA, Narcolepsy, or shift work sleep disorder/fim

## 2018-01-08 ENCOUNTER — Encounter: Payer: Self-pay | Admitting: *Deleted

## 2018-01-22 ENCOUNTER — Other Ambulatory Visit: Payer: Self-pay | Admitting: Neurology

## 2018-01-25 DIAGNOSIS — G35 Multiple sclerosis: Secondary | ICD-10-CM | POA: Diagnosis not present

## 2018-01-30 ENCOUNTER — Telehealth: Payer: Self-pay | Admitting: Neurology

## 2018-01-30 MED ORDER — OXYCODONE-ACETAMINOPHEN 5-325 MG PO TABS: ORAL_TABLET | ORAL | 0 refills | Status: DC

## 2018-01-30 NOTE — Addendum Note (Signed)
Addended by: Candis Schatz I on: 01/30/2018 11:45 AM   Modules accepted: Orders

## 2018-01-30 NOTE — Telephone Encounter (Signed)
Rx. awaiting RAS sig/fim 

## 2018-01-30 NOTE — Telephone Encounter (Signed)
Pt has called for a refill on her oxyCODONE-acetaminophen (PERCOCET/ROXICET) 5-325 MG tablet 4 mo f/u scheduled for 05-03-2018

## 2018-01-30 NOTE — Telephone Encounter (Signed)
Rx. up front [facility]/fim 

## 2018-02-22 ENCOUNTER — Other Ambulatory Visit: Payer: Self-pay | Admitting: Neurology

## 2018-02-22 DIAGNOSIS — G35 Multiple sclerosis: Secondary | ICD-10-CM | POA: Diagnosis not present

## 2018-02-28 ENCOUNTER — Telehealth: Payer: Self-pay | Admitting: Neurology

## 2018-02-28 MED ORDER — OXYCODONE-ACETAMINOPHEN 5-325 MG PO TABS: ORAL_TABLET | ORAL | 0 refills | Status: DC

## 2018-02-28 NOTE — Telephone Encounter (Signed)
Pt has called for a refill on oxyCODONE-acetaminophen (PERCOCET/ROXICET) 5-325 MG tablet

## 2018-02-28 NOTE — Telephone Encounter (Signed)
Rx. up front [facility]/fim 

## 2018-02-28 NOTE — Addendum Note (Signed)
Addended by: Candis Schatz I on: 02/28/2018 01:32 PM   Modules accepted: Orders

## 2018-02-28 NOTE — Telephone Encounter (Signed)
Rx. awaiting RAS sig/fim 

## 2018-03-27 ENCOUNTER — Ambulatory Visit: Payer: Medicare Other | Admitting: Neurology

## 2018-03-27 DIAGNOSIS — G35 Multiple sclerosis: Secondary | ICD-10-CM | POA: Diagnosis not present

## 2018-03-28 ENCOUNTER — Telehealth: Payer: Self-pay | Admitting: Neurology

## 2018-03-28 MED ORDER — CLONAZEPAM 0.5 MG PO TABS: 0.5000 mg | ORAL_TABLET | Freq: Three times a day (TID) | ORAL | 0 refills | Status: DC | PRN

## 2018-03-28 NOTE — Telephone Encounter (Signed)
Pt requesting refills for clonazePAM (KLONOPIN) 0.5 MG tablet sent to [facility]

## 2018-03-28 NOTE — Telephone Encounter (Signed)
Rx. awaiting RAS sig/fim 

## 2018-03-29 NOTE — Telephone Encounter (Signed)
Phentermine rx. was faxed to [facility] yesterday/fim

## 2018-04-02 ENCOUNTER — Telehealth: Payer: Self-pay | Admitting: Neurology

## 2018-04-02 MED ORDER — OXYCODONE-ACETAMINOPHEN 5-325 MG PO TABS: ORAL_TABLET | ORAL | 0 refills | Status: DC

## 2018-04-02 NOTE — Telephone Encounter (Signed)
Rx. up front [facility]/fim 

## 2018-04-02 NOTE — Telephone Encounter (Signed)
Rx. awaiting RAS sig/fim 

## 2018-04-02 NOTE — Telephone Encounter (Signed)
Pt request refill for oxyCODONE-acetaminophen (PERCOCET/ROXICET) 5-325 MG tablet. °

## 2018-04-27 DIAGNOSIS — G35 Multiple sclerosis: Secondary | ICD-10-CM | POA: Diagnosis not present

## 2018-05-03 ENCOUNTER — Encounter: Payer: Self-pay | Admitting: Neurology

## 2018-05-03 ENCOUNTER — Ambulatory Visit (INDEPENDENT_AMBULATORY_CARE_PROVIDER_SITE_OTHER): Payer: Medicare Other | Admitting: Neurology

## 2018-05-03 ENCOUNTER — Telehealth: Payer: Self-pay | Admitting: *Deleted

## 2018-05-03 VITALS — BP 119/72 | HR 77 | Ht 60.0 in | Wt 79.0 lb

## 2018-05-03 DIAGNOSIS — R208 Other disturbances of skin sensation: Secondary | ICD-10-CM

## 2018-05-03 DIAGNOSIS — R634 Abnormal weight loss: Secondary | ICD-10-CM

## 2018-05-03 DIAGNOSIS — R5383 Other fatigue: Secondary | ICD-10-CM

## 2018-05-03 DIAGNOSIS — R269 Unspecified abnormalities of gait and mobility: Secondary | ICD-10-CM | POA: Diagnosis not present

## 2018-05-03 DIAGNOSIS — G35 Multiple sclerosis: Secondary | ICD-10-CM | POA: Diagnosis not present

## 2018-05-03 DIAGNOSIS — G8929 Other chronic pain: Secondary | ICD-10-CM | POA: Diagnosis not present

## 2018-05-03 DIAGNOSIS — M5432 Sciatica, left side: Secondary | ICD-10-CM

## 2018-05-03 MED ORDER — LAMOTRIGINE 100 MG PO TABS: 100.0000 mg | ORAL_TABLET | Freq: Two times a day (BID) | ORAL | 11 refills | Status: DC

## 2018-05-03 MED ORDER — CLONAZEPAM 0.5 MG PO TABS: 0.5000 mg | ORAL_TABLET | Freq: Three times a day (TID) | ORAL | 0 refills | Status: DC | PRN

## 2018-05-03 MED ORDER — MEGESTROL ACETATE 40 MG PO TABS: 40.0000 mg | ORAL_TABLET | Freq: Every day | ORAL | 5 refills | Status: DC

## 2018-05-03 MED ORDER — OXYCODONE-ACETAMINOPHEN 5-325 MG PO TABS: ORAL_TABLET | ORAL | 0 refills | Status: DC

## 2018-05-03 MED ORDER — MODAFINIL 200 MG PO TABS: 200.0000 mg | ORAL_TABLET | Freq: Every day | ORAL | 5 refills | Status: DC

## 2018-05-03 NOTE — Patient Instructions (Signed)
The pharmacy has the prescription for lamotrigine 100 mg tablets. For 5 days, just take one half pill a day. For the next 5 days, take one half pill twice a day. For the next 5 days, take one half pill 3 times a day Then start taking one pill twice a day from this point on.    In the future, we may increase the dose further.  If you get a rash, need to stop the medication and not take it again. 

## 2018-05-03 NOTE — Telephone Encounter (Signed)
Placed JCV lab in [facility] lock box for routine lab pick up.  

## 2018-05-03 NOTE — Progress Notes (Signed)
u  [facility]  PATIENT: Sabrina [name] DOB: Apr 10, 1981    _________________________________   HISTORICAL  CHIEF COMPLAINT:  Chief Complaint  Patient presents with  . Follow-up    RM 12, alone. Last seen 01/01/18. No vision changes. No falls since last seen.  . Multiple Sclerosis    On Tysabri. Last infusion: 04/27/18. Doing well on medication. Day of infusion she feels "knocked down"  but ok otherwise.  . Pain    She has not been taking lamictal. She could not remember what it was for. She would like to start taking this again for sciatic pain.    HISTORY OF PRESENT ILLNESS:  Sabrina [name] is a 37 y.o. woman with multiple sclerosis and chronic pain.     Update 05/03/18: She feels her MS has been stable.  She denies any exacerbations.  She is on Tysabri with her last infusion 04/27/2018 generally when she takes Tysabri, she feels very tired that day but then better the rest of the month.   She has some fluctuations in symptoms.   She is walking ok and uses a cane for longer distance.    Balance is off.    No recent falls.     She has pain that is most severe in the left leg and hip.    She is on Percocet with benefit and is not escalating.     She has anxiety helped by clonazepam (0.5 mg tid)  Her appetite is ok but she still has not gained any weight recently.   Megace has not helped much.   Update 01/01/2018: Her MS has been doing about the same.  Specifically, she has not had any exacerbations.  She is on Tysabri and she gets her infusions in [place].  Her JCV antibodies have been negative.  She still has some difficulties with her gait being mildly wide or balance at times.  She notes no major problems with focal weakness (does feel generalized weak).   She does have some difficulties with dysesthetic pain.  Cymbalta helped some when we added that at the last visit.    She is reporting more pain in her left lower back/buttock and down the left leg.    Her  fatigue has been worse.    She gets some benefit from Provigil but the benefit seems less than it was in the past and wears off by early afternoon.  She has gained a few pounds.   Appetite is still poor.     Update 11/23/2017: Her MS has been stable with no new exacerbations.   She is on Tysabri and she tolerates it well.    She does her infusions in [place], last one yesterday.    Hr JCV ws negative at 0.27 July 2017.   Neurologically she is doing well.  Her gait is actually better than it was a couple years ago and she no longer needs to use a cane.  Her stride is good though she still feels a little off balance at times.  She denies any significant weakness or numbness in the limbs.  She has some urinary urgency with occasional incontinence.  Vision is fine.  Her fatigue has done as well on Provigil as I did on Adderall.  She continues to have difficulty with her weight and weights only 82 pounds.   She eats and wish she would gain weight.  She is off the Adderall.     She repots a lot of pain, especially in  the left hip and buttock.   Sometimes the right hip hurts more when she puts weight on it.     Currently, she is on oxycodone 5 mg p.o. 3 times daily.  Update 07/26/2017: She is doing her monthly Tysabri in [place]. She tolerates the infusions well. She has done much better on Tysabri than on Copaxone. She is JCV antibody negative. The last test was 10/17/2016 and was negative and 0.17. Imbalance is unchanged.    She has had a couple falls with tripping but is no worse..  . She is able to walk without a cane but will sometimes use a cane or walker if she feels weaker or more tired. He has painful dysesthesias in her legs.    She has mildly reduced vision out of the right eye. She has urinary frequency and urgency. This is fairly stable and she has rare night time incontinence.     He continues to report fatigue.  She has been prescribed Adderall but does not take on a regular basis. Her  weight is a little lower than the last visit at only 86 pounds. Megace had not helped her weight gain. Sleep is variable and she still has some trouble falling asleep and staying asleep. She notes some depression but feels better currently than much of last year. She notes some decreased focus and attention, also helped by Adderall when she takes it.  She has bilateral sciatica with pain in the buttocks radiating to the legs. Trigger point injections have helped (piriformis).   Lumbar MRI was ok.   Lamotrigine helps some.     Hydrocodone helps the pain.      From 02/28/2017: She went to [facility] week after noting more fogginess and having more pain and weakness in her legs, especially the right leg.   She fell down in the seat and couch having trouble getting up.   She was more emotional and foggy headed.   She went to [facility] ED and had MRI's.  The brain looks the same.     She has multiple thoracic spine lesions and one at C6C7 looked expansile but did not enhance (unclear if subacute or not).   She had several days of IV Steroids.    She is now feeling back to her baseline. I personally reviewed the MRI of the brain and MRI of the thoracic spine performed during her recent hospitalization. The MRI of the brain shows many T2/FLAIR hyperintense foci in the periventricular, juxtacortical and deep white matter. Her to be any change when compared to her previous MRI. The MRI of the thoracic spine shows multiple T2 hyperintense lesions. The T6-T7 lesion is fairly large and there could be slight expansion of the spinal cord. However, there is no enhancement.      MS:   She continues to do monthly Tysabri (in [place]) since 2016.   She is tolerating the infusions well.   This is her first possible exacerbations since starting Tysabri. She has done much better on Tysabri that she did on Copaxone.  Gait/strength/sensation/pain:   Currently her gait is back to baseline. She is able to walk without a cane  most of the time. However, when pain accepts uses cane or walker. Mild weakness in her legs. There are some painful dysesthesias in the legs.  Vision:      She notes no change in the right blurry vision.    She denies any diplopia.  Sciatica/pain:  She continues to  experience sciatic type pain in the back and left leg. In the past, piriformis injections have helped her. Pain is not too bad since she received several days of steroids to the hospital.  In the past, she received the best benefit with Botox but her co-pay was too high.  Other med's including gabapentin, Lyrica and Tegretol are not well tolerated.  Lamictal helps the pain some.      Lumbar MRI was normal.      Bladder:  She notes stable urinary frequency and urgency and has nocturnal incontinence once a month.  She has 1 - 2 times nocturia and sometimes has nocturnal incontinence.   In the past she was on oxybutynin with benefit.  She denies hesitrancy.   Fatigue/sleep:   She has fatigue is physical more than mental. Adderall has helped her fatigue quite a bit. .  Even though she has weight loss, she feels appetite is not suppressed.    Sometimes, she will take only one Adderall pill and occasionally does not take any..       She feels she sleeps much better now than last year and RLS/PLMS is better.    Mood/cognition:   Depression and anxiety are doing a little bit better this visit than last visit..  She notes stress with family and the holidays.   She has had some panic attacks and clonazepam helps the most.   She notes mild cognitive issues with poor focus and attention and reduced verbal fluency.    She is on Pristiq and lamotrigine with benefit  MS History:  She had right optic neuritis in December 2012 and had an MRI of the brain showing optic nerve inflammation and many white matter spots.    She saw Dr. Macon Large in [place].   She was started on Copaxone.  She likely had an exacerbation in 2013 when she had trouble walking x 2 months.    She received a few days of IV Solu-Medrol.  Last year, she also received a few days of IV Solu-Medrol but she is uncertain   She felt she did well for the first 2 years but switched to Gilenya because she was tired of the shots last year.   At first, she tolerated it well but then her depression got much worse and she had some stomach issues so she returned to Copaxone last month (20 mg daily).   MRI early 2016 showed multiple old MS plaques and 1  Enhancing focus in the right frontal lobe prompting change to Tysabi.     REVIEW OF SYSTEMS: Constitutional: No fevers, chills, sweats, or change in appetite.  Notes a lot of fatigue Eyes: No visual changes, double vision, eye pain Ear, nose and throat: No hearing loss, ear pain, nasal congestion, sore throat Cardiovascular: No chest pain, palpitations Respiratory: No shortness of breath at rest or with exertion.   No wheezes GastrointestinaI: No nausea, vomiting, diarrhea, abdominal pain, fecal incontinence Genitourinary: as above. Musculoskeletal: No neck pain, back pain Integumentary: No rash, pruritus, skin lesions Neurological: as above Psychiatric: Notes some depression and anxiety Endocrine: No palpitations, diaphoresis, change in appetite, change in weigh or increased thirst Hematologic/Lymphatic: No anemia, purpura, petechiae. Allergic/Immunologic: No itchy/runny eyes, nasal congestion, recent allergic reactions, rashes  ALLERGIES: Allergies  Allergen Reactions  . Amoxicillin Anaphylaxis  . Penicillins Anaphylaxis  . Sulfa Antibiotics Other (See Comments)    unknown  . Codeine Nausea And Vomiting    HOME MEDICATIONS:  Current Outpatient Medications:  .  clonazePAM (KLONOPIN) 0.5 MG tablet, Take 1 tablet (0.5 mg total) by mouth 3 (three) times daily as needed. for anxiety, Disp: 60 tablet, Rfl: 0 .  cyclobenzaprine (FLEXERIL) 5 MG tablet, Take 1 tablet (5 mg total) by mouth every 8 (eight) hours as needed for muscle spasms.,  Disp: 90 tablet, Rfl: 5 .  DULoxetine (CYMBALTA) 60 MG capsule, TAKE 1 CAPSULE(60 MG) BY MOUTH DAILY, Disp: 90 capsule, Rfl: 3 .  feeding supplement, ENSURE ENLIVE, (ENSURE ENLIVE) LIQD, Take 237 mLs by mouth 2 (two) times daily between meals., Disp: 237 mL, Rfl: 12 .  megestrol (MEGACE) 40 MG tablet, Take 1 tablet (40 mg total) by mouth daily., Disp: 30 tablet, Rfl: 5 .  modafinil (PROVIGIL) 200 MG tablet, Take 1 tablet (200 mg total) by mouth daily., Disp: 30 tablet, Rfl: 5 .  natalizumab (TYSABRI) 300 MG/15ML injection, Inject 15 mLs (300 mg total) every 30 (thirty) days into the vein., Disp: 15 mL, Rfl: 12 .  oxybutynin (DITROPAN) 5 MG tablet, Take 1 tablet (5 mg total) by mouth 2 (two) times daily as needed for bladder spasms., Disp: 60 tablet, Rfl: 11 .  oxyCODONE-acetaminophen (PERCOCET/ROXICET) 5-325 MG tablet, One po qid prn, Disp: 120 tablet, Rfl: 0 .  ranitidine (ZANTAC) 150 MG tablet, Take 150 mg by mouth 2 (two) times daily., Disp: , Rfl:  .  lamoTRIgine (LAMICTAL) 100 MG tablet, Take 1 tablet (100 mg total) by mouth 2 (two) times daily., Disp: 60 tablet, Rfl: 11  PAST MEDICAL HISTORY: Past Medical History:  Diagnosis Date  . Chronic pain syndrome   . Depression   . Fibromyalgia   . GAD (generalized anxiety disorder)   . Headache   . Multiple sclerosis (HCC) 10/28/2014  . Obsessive compulsive disorder   . Vision abnormalities     PAST SURGICAL HISTORY: Past Surgical History:  Procedure Laterality Date  . NO PAST SURGERIES      FAMILY HISTORY: Family History  Problem Relation Age of Onset  . Healthy Mother   . Diabetes type II Father   . Prostate cancer Father     SOCIAL HISTORY:  Social History   Socioeconomic History  . Marital status: Married    Spouse name: Not on file  . Number of children: Not on file  . Years of education: Not on file  . Highest education level: Not on file  Occupational History  . Not on file  Social Needs  . Financial resource  strain: Not on file  . Food insecurity:    Worry: Not on file    Inability: Not on file  . Transportation needs:    Medical: Not on file    Non-medical: Not on file  Tobacco Use  . Smoking status: Current Every Day Smoker    Packs/day: 0.50    Types: Cigarettes  . Smokeless tobacco: Never Used  Substance and Sexual Activity  . Alcohol use: No    Alcohol/week: 0.0 standard drinks  . Drug use: No  . Sexual activity: Not on file  Lifestyle  . Physical activity:    Days per week: Not on file    Minutes per session: Not on file  . Stress: Not on file  Relationships  . Social connections:    Talks on phone: Not on file    Gets together: Not on file    Attends religious service: Not on file    Active member of club or organization: Not on file    Attends meetings of  clubs or organizations: Not on file    Relationship status: Not on file  . Intimate partner violence:    Fear of current or ex partner: Not on file    Emotionally abused: Not on file    Physically abused: Not on file    Forced sexual activity: Not on file  Other Topics Concern  . Not on file  Social History Narrative  . Not on file     PHYSICAL EXAM  Vitals:   05/03/18 1059  BP: 119/72  Pulse: 77  Weight: 79 lb (35.8 kg)  Height: 5' (1.524 m)    Body mass index is 15.43 kg/m.   General: The patient is well-developed and well-nourished and in no acute distress  Musculoskeletal:   She is tender over the piriformis muscles/sacroiliac joints bilaterally, more on the left.  She has very poor posture.  Neurologic Exam  Mental status:  The patient is alert and oriented x 3 at the time of the examination. The patient has apparent normal recent and remote memory, with a mildly reduced attention span and concentration ability.     Cranial nerves: Extraocular movements are full.  Facial strength and sensation was normal.  Trapezius strength was normal.  Hearing was normal and symmetric.  Motor:  Muscle bulk  is normal.   Muscle tone is mildly increased in legs. Strength is 5/5.Marland Kitchen   Sensory: She has normal sensation in her arms.  She has reduced vibration sensation in the toes and ankles.  Coordination: Cerebellar testing reveals good finger-nose-finger bilaterally.  She has mildly reduced heel-to-shin bilaterally.  Gait and station: Station is normal.  The gait is near normal.  The tandem gait is wide.. Romberg is negative.  Reflexes: Deep tendon reflexes are normal in the arms increased bilaterally with nonsustained clonus.  At the ankles.      DIAGNOSTIC DATA (LABS, IMAGING, TESTING) - I reviewed patient records, labs, notes, testing and imaging myself where available.       ASSESSMENT AND PLAN  Multiple sclerosis (HCC) - Plan: CBC with Differential/Platelet, Stratify JCV Antibody Test ([facility])  Left sided sciatica  Other fatigue  Gait disorder  Dysesthesia  Abnormal weight loss  Other chronic pain   1.  We will check a JCV antibody today.  She will continue her Tysabri infusions in [place].   2.  Renew clonazepam for anxiety and spasms, oxycodone (5 mg p.o. 4 times a day) for pain.  [place] controlled substance database was reviewed and she is not taking more than the prescribed dose.  She understands not to take more than the prescribed amount. 3.   Renew Provigil and Megace.  Restart lamotrigine and titrate up to 100 mg twice daily. 4.    rtc 4 months, or sooner if new or worsening neurologic symptoms  Richard A. Epimenio Foot, MD, PhD 05/03/2018, 5:56 PM Certified in Neurology, Clinical Neurophysiology, Sleep Medicine, Pain Medicine and Neuroimaging  [facility] [address] [place], [place] [address] [phone]

## 2018-05-04 LAB — CBC WITH DIFFERENTIAL/PLATELET
BASOS: 1 %
Basophils Absolute: 0.1 10*3/uL (ref 0.0–0.2)
EOS (ABSOLUTE): 0.1 10*3/uL (ref 0.0–0.4)
Eos: 1 %
HEMATOCRIT: 43.8 % (ref 34.0–46.6)
HEMOGLOBIN: 15 g/dL (ref 11.1–15.9)
Immature Grans (Abs): 0 10*3/uL (ref 0.0–0.1)
Immature Granulocytes: 0 %
LYMPHS ABS: 3.5 10*3/uL — AB (ref 0.7–3.1)
LYMPHS: 44 %
MCH: 30.2 pg (ref 26.6–33.0)
MCHC: 34.2 g/dL (ref 31.5–35.7)
MCV: 88 fL (ref 79–97)
MONOCYTES: 6 %
Monocytes Absolute: 0.5 10*3/uL (ref 0.1–0.9)
NEUTROS ABS: 3.7 10*3/uL (ref 1.4–7.0)
Neutrophils: 48 %
Platelets: 323 10*3/uL (ref 150–450)
RBC: 4.97 x10E6/uL (ref 3.77–5.28)
RDW: 13 % (ref 12.3–15.4)
WBC: 7.8 10*3/uL (ref 3.4–10.8)

## 2018-05-05 ENCOUNTER — Other Ambulatory Visit: Payer: Self-pay | Admitting: Neurology

## 2018-05-10 NOTE — Telephone Encounter (Signed)
Received results from [facility] for JCV lab drawn 05/03/18: negative 0.17

## 2018-05-15 IMAGING — MR MR THORACIC SPINE WO/W CM
16 of 26 series · 22 of 48 positions shown · IV contrast (multihance)
Comparison: None.

CLINICAL DATA: One-week of progressive bilateral leg weakness,
visual changes, nocturnal incontinence and cloudy headed ; symptoms
associated with prior MS exacerbation. Relapsing remitting multiple
sclerosis, chronic pain syndrome

EXAM:
MRI THORACIC WITHOUT AND WITH CONTRAST
TECHNIQUE: Multiplanar and multiecho pulse sequences of the thoracic spine were
obtained without and with intravenous contrast.
CONTRAST:  9mL MULTIHANCE GADOBENATE DIMEGLUMINE 529 MG/ML IV SOLN

[Series 4: DWI · axial · 3.0mm · 0.94mm/px · z∈[-25,+118]mm · 3 of 99 slices shown (1 of 2)]
[im 1/99]
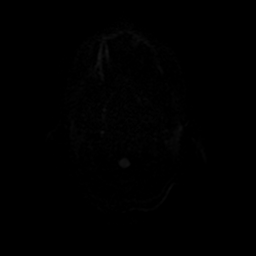
[im 50/99]
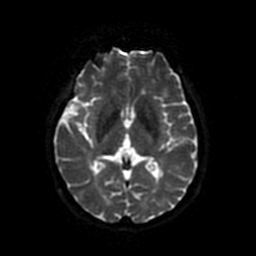
[im 99/99]
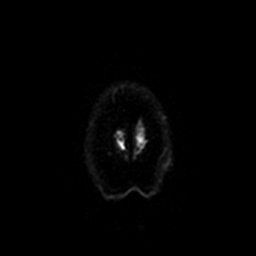

[Series 5: DWI · coronal · 4.0mm · 0.94mm/px · 1 of 69 slices shown (2 of 2)]
[im 1/69]
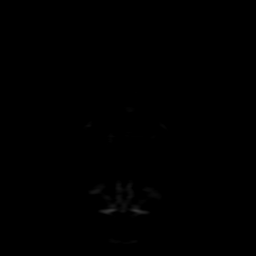

[Series 7: T2 · axial · 5.0mm · 0.43mm/px · 1 of 25 slices shown (1 of 5)]
[im 1/25]
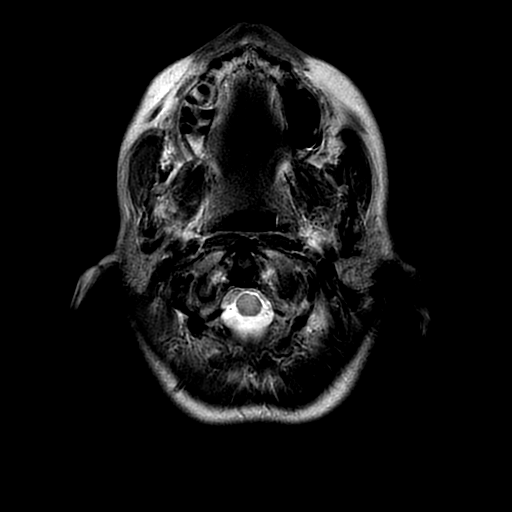

[Series 12: T2 · coronal · 5.0mm · 0.39mm/px · 1 of 29 slices shown (2 of 5)]
[im 1/29]
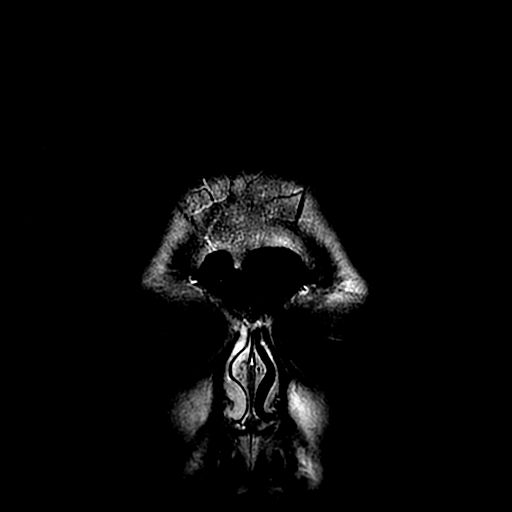

[Series 14: T1 · sagittal · 3.0mm · 0.90mm/px · 1 of 12 slices shown (1 of 5)]
[im 1/12]
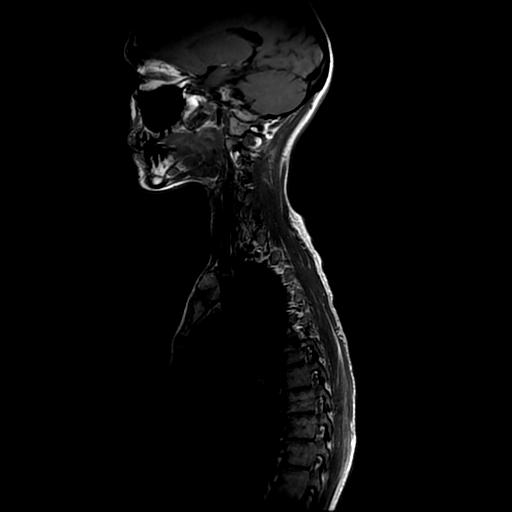

[Series 17: T2 · sagittal · 3.0mm · 0.66mm/px · 1 of 13 slices shown (3 of 5)]
[im 1/13]
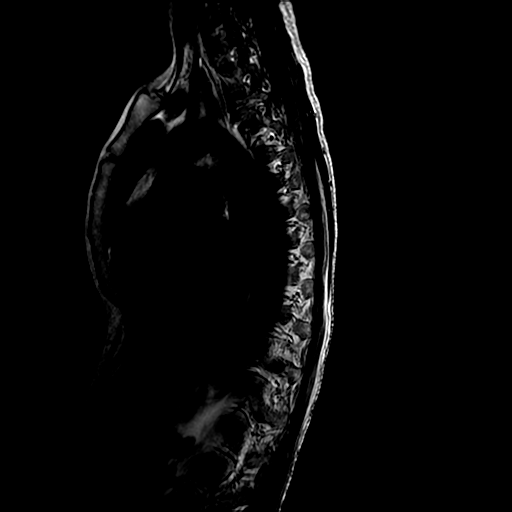

[Series 18: STIR · sagittal · 3.0mm · 0.66mm/px · 1 of 13 slices shown]
[im 1/13]
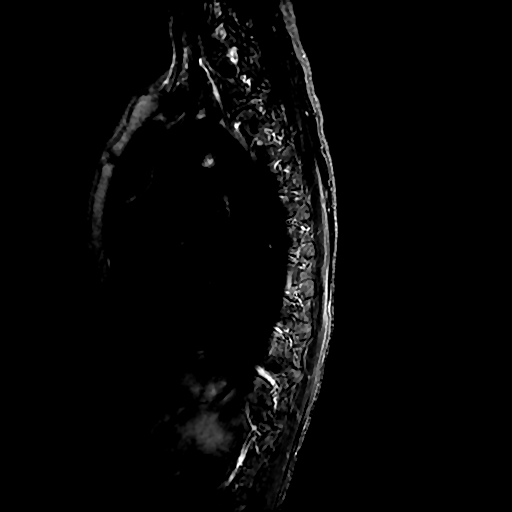

[Series 19: T1 · sagittal · 3.0mm · 0.66mm/px · 1 of 13 slices shown (2 of 5)]
[im 1/13]
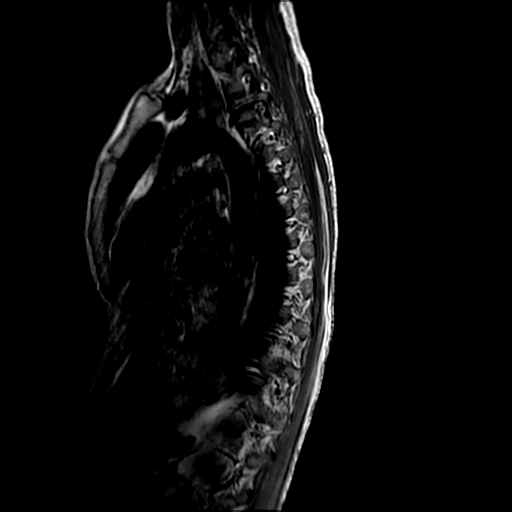

[Series 20: T2 · axial · 4.0mm · 0.39mm/px · 1 of 29 slices shown (4 of 5)]
[im 1/29]
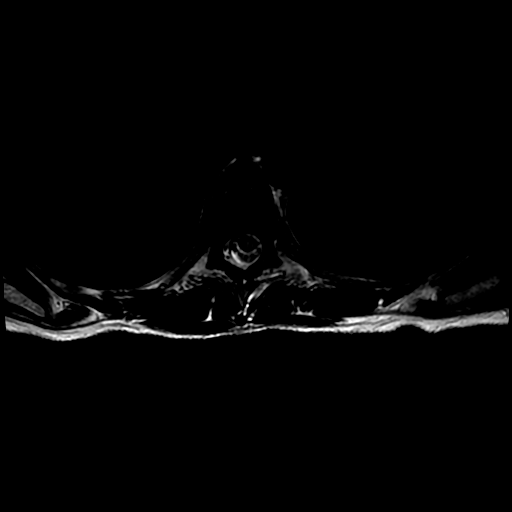

[Series 22: T1 · axial · non-contrast · 3.0mm · 0.39mm/px · z∈[-254,-112]mm · 2 of 46 slices shown (3 of 5)]
[im 1/46]
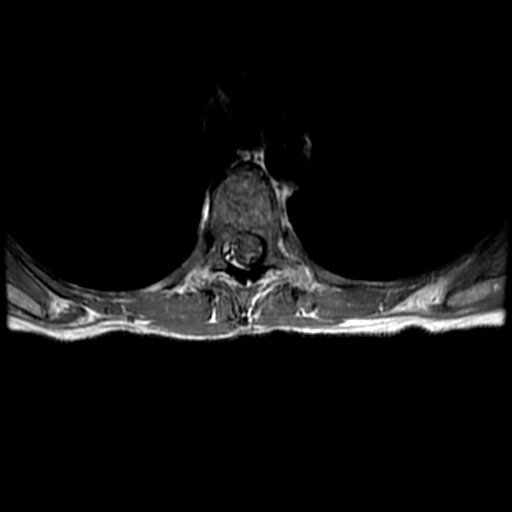
[im 46/46]
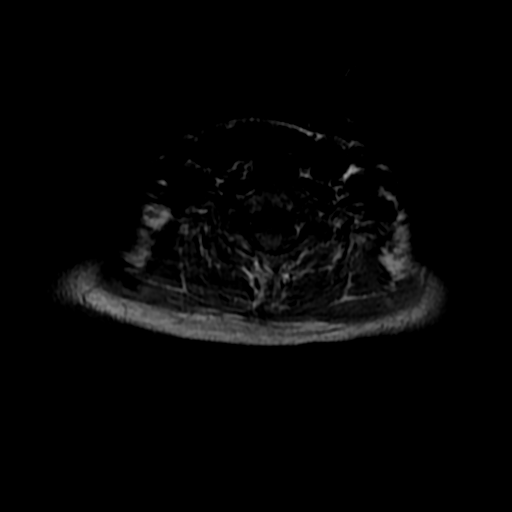

[Series 23: T2 · axial · 4.0mm · 0.39mm/px · 1 of 23 slices shown (5 of 5)]
[im 1/23]
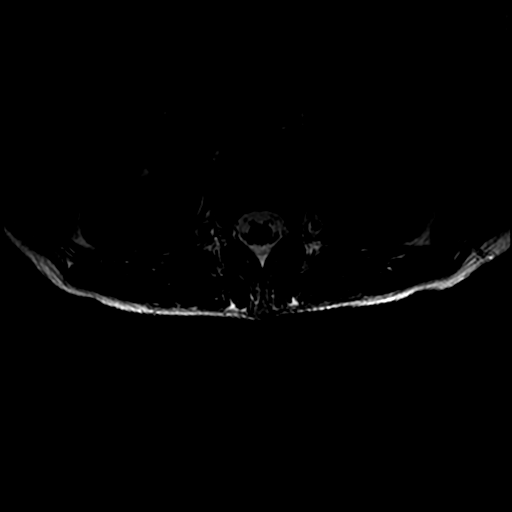

[Series 25: T1 · axial · non-contrast · 3.0mm · 0.39mm/px · z∈[-351,-229]mm · 2 of 36 slices shown (4 of 5)]
[im 1/36]
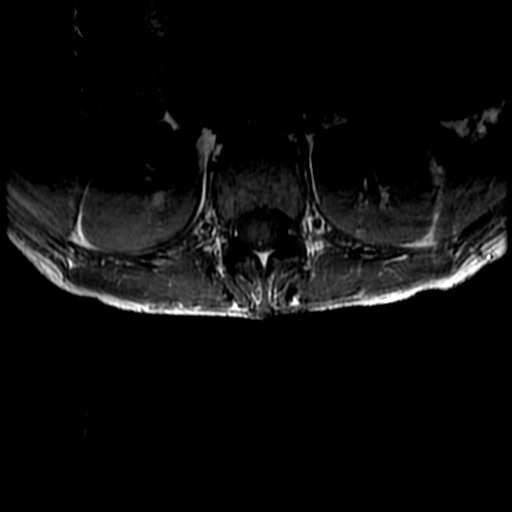
[im 36/36]
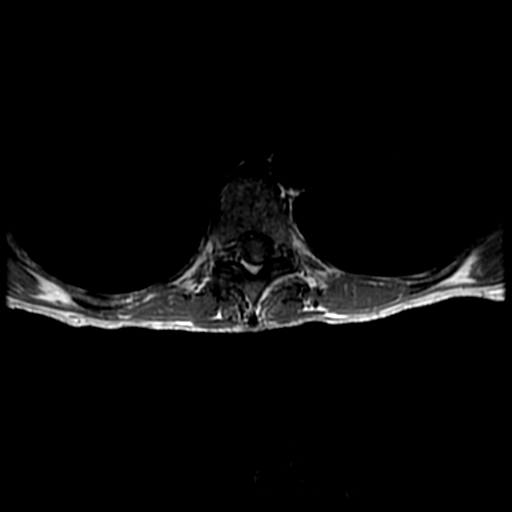

[Series 28: T1 fat-sat post-contrast · sagittal · 3.0mm · 0.66mm/px · 1 of 13 slices shown]
[im 1/13]
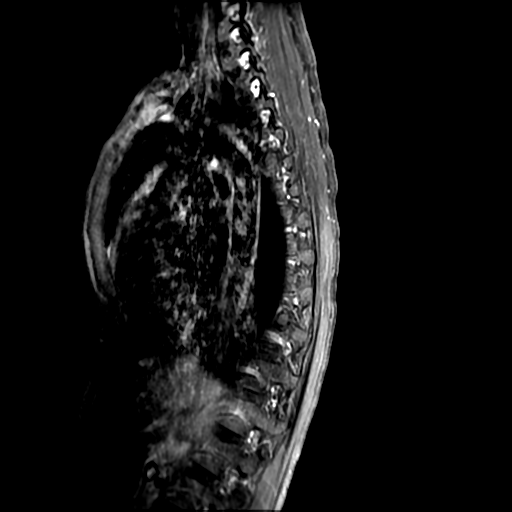

[Series 29: T1 post-contrast · axial · 3.0mm · 0.39mm/px · z∈[-256,-114]mm · 2 of 46 slices shown (1 of 2)]
[im 1/46]
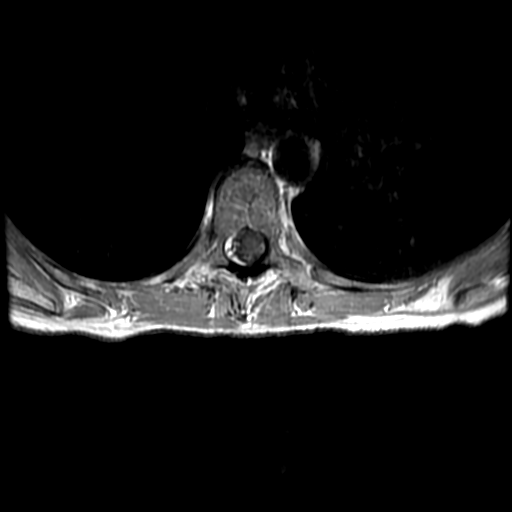
[im 46/46]
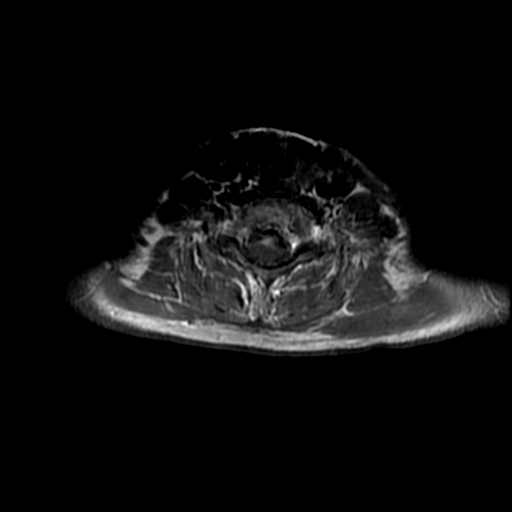

[Series 30: T1 post-contrast · axial · 3.0mm · 0.39mm/px · z∈[-354,-232]mm · 2 of 36 slices shown (2 of 2)]
[im 1/36]
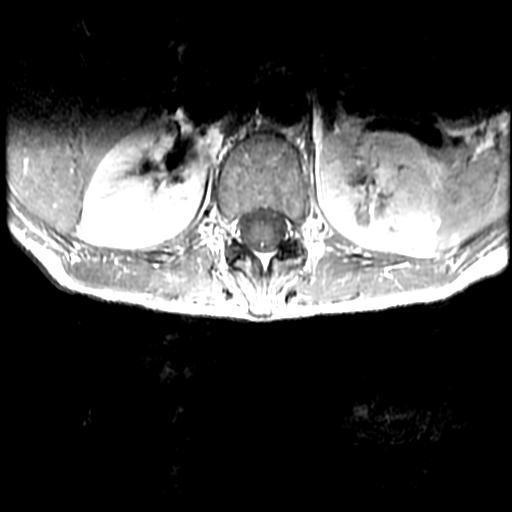
[im 36/36]
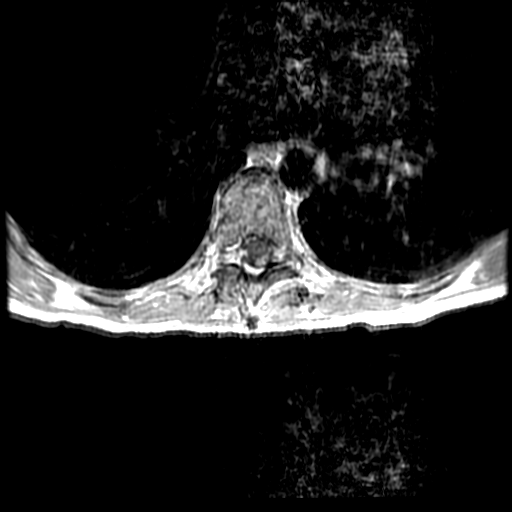

[Series 33: T1 · coronal · 5.0mm · 0.43mm/px · 1 of 30 slices shown (5 of 5)]
[im 1/30]
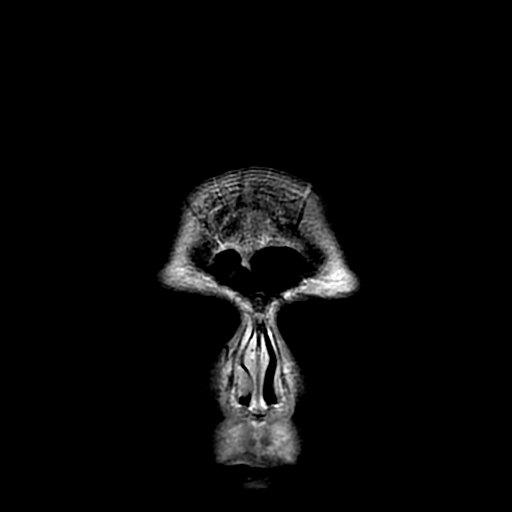

[22 of 48 positions shown; findings below may reference images not displayed]

FINDINGS: ALIGNMENT: Maintenance of the thoracic kyphosis. No malalignment.

VERTEBRAE/DISCS: Vertebral bodies are intact. Intervertebral discs
morphology and signal are normal. No abnormal or acute bone marrow
signal. No abnormal osseous or disc enhancement.

CORD: Thoracic spinal cord is normal morphology. Patchy abnormal T2
bright signal throughout the spinal cord, mildly expansile at T6-7.
No convincing evidence of spinal cord enhancement though, pulsation
artifact limits assessment on axial sequences. No abnormal epidural
or leptomeningeal enhancement.

PREVERTEBRAL AND PARASPINAL SOFT TISSUES:  Normal.

DISC LEVELS:

No disc bulge, canal stenosis or neural foraminal narrowing at any
level.
IMPRESSION: 1. Numerous demyelinating plaques throughout the thoracic spinal
cord without enhancement. Considering mild expansile appearance at
T6-7 suggesting acute lesion. No myelomalacia.

## 2018-05-29 DIAGNOSIS — G35 Multiple sclerosis: Secondary | ICD-10-CM | POA: Diagnosis not present

## 2018-05-31 ENCOUNTER — Telehealth: Payer: Self-pay | Admitting: Neurology

## 2018-05-31 MED ORDER — OXYCODONE-ACETAMINOPHEN 5-325 MG PO TABS: ORAL_TABLET | ORAL | 0 refills | Status: DC

## 2018-05-31 NOTE — Telephone Encounter (Addendum)
Pt requesting refills for oxyCODONE-acetaminophen (PERCOCET/ROXICET) 5-325 MG tablet aware the office closes at noon tomorrow. Fill date is 11/27.

## 2018-05-31 NOTE — Telephone Encounter (Signed)
Placed rx up front for pick up. 

## 2018-05-31 NOTE — Telephone Encounter (Signed)
Checked drug registry. Last refilled 05/07/18 #120, not receiving from other MD's. Post-dated rx to fill on or after 06/06/18. Last seen 05/03/18 and next f/u 09/04/18. Rx printed, waiting on MD signature

## 2018-06-05 ENCOUNTER — Other Ambulatory Visit: Payer: Self-pay | Admitting: Neurology

## 2018-06-12 ENCOUNTER — Telehealth: Payer: Self-pay | Admitting: Neurology

## 2018-06-12 MED ORDER — NATALIZUMAB 300 MG/15ML IV CONC: 300.0000 mg | INTRAVENOUS | 12 refills | Status: DC

## 2018-06-12 NOTE — Telephone Encounter (Signed)
RN at [facility] requesting refills for natalizumab (TYSABRI) 300 MG/15ML injection sent to Fax # [phone]

## 2018-06-12 NOTE — Telephone Encounter (Signed)
Faxed signed rx to [facility]. Received fax confirmation.

## 2018-06-12 NOTE — Telephone Encounter (Signed)
Printed rx, waiting on MD signature 

## 2018-06-13 MED ORDER — NATALIZUMAB 300 MG/15ML IV CONC: INTRAVENOUS | 0 refills | Status: DC

## 2018-06-13 NOTE — Addendum Note (Signed)
Addended by: Hillis Range on: 06/13/2018 02:25 PM   Modules accepted: Orders

## 2018-06-13 NOTE — Telephone Encounter (Signed)
Rx printed, waiting on MD signature 

## 2018-06-13 NOTE — Telephone Encounter (Signed)
Sabrina [name] with [facility] called stating she did get the refill for Tysabri. Stating she will need an rx for November 2019 for their medical records. Any questions Sabrina [name] can be reached at [phone].

## 2018-06-13 NOTE — Telephone Encounter (Signed)
I called back. She states her last prescription only good through October 2019. They need rx for November 2019 and faxed to have on file. Advised Dr. Epimenio Foot out of the office today but I can send in tomorrow for them. She verbalized understanding.

## 2018-06-14 NOTE — Telephone Encounter (Signed)
Faxed printed/signed rx Tysbri for month of November 2019 to [facility] as requested. Received fax confirmation.

## 2018-06-21 NOTE — Telephone Encounter (Signed)
[facility] called stating that did not receive the rx for tysabri month of November, requesting the rx be refaxed to [phone] attn Olegario Messier

## 2018-06-21 NOTE — Telephone Encounter (Signed)
Re-faxed prescription attn: Olegario Messier as requested. Received fax confirmation.

## 2018-06-22 ENCOUNTER — Other Ambulatory Visit: Payer: Self-pay | Admitting: Neurology

## 2018-06-29 ENCOUNTER — Other Ambulatory Visit: Payer: Self-pay | Admitting: Neurology

## 2018-06-29 MED ORDER — OXYCODONE-ACETAMINOPHEN 5-325 MG PO TABS: ORAL_TABLET | ORAL | 0 refills | Status: DC

## 2018-06-29 NOTE — Telephone Encounter (Signed)
Placed printed/signed rx up front for pt pick up. 

## 2018-06-29 NOTE — Telephone Encounter (Signed)
Pt request refill for oxyCODONE-acetaminophen (PERCOCET/ROXICET) 5-325 MG tablet. Pt is aware the clinic closes at noon today.

## 2018-06-29 NOTE — Telephone Encounter (Signed)
Rx. awaiting RAS sig/fim 

## 2018-07-02 DIAGNOSIS — G35 Multiple sclerosis: Secondary | ICD-10-CM | POA: Diagnosis not present

## 2018-07-17 ENCOUNTER — Other Ambulatory Visit: Payer: Self-pay | Admitting: Neurology

## 2018-07-17 NOTE — Telephone Encounter (Signed)
[place] Database Verified LR 06-11-18 Qty: 60 Pending appointment 09-04-2018

## 2018-07-31 ENCOUNTER — Other Ambulatory Visit: Payer: Self-pay | Admitting: Neurology

## 2018-07-31 MED ORDER — OXYCODONE-ACETAMINOPHEN 5-325 MG PO TABS: ORAL_TABLET | ORAL | 0 refills | Status: DC

## 2018-07-31 NOTE — Telephone Encounter (Signed)
Pt is needing a refill on her oxyCODONE-acetaminophen (PERCOCET/ROXICET) 5-325 MG tablet sent to [facility] on [address]

## 2018-07-31 NOTE — Telephone Encounter (Signed)
Checked drug registry. Last refilled 07/06/18 #120, not receiving from other MD's. Last seen 05/03/18 and next f/u 09/04/18. Rx request sent to MD to escribe

## 2018-08-03 DIAGNOSIS — G35 Multiple sclerosis: Secondary | ICD-10-CM | POA: Diagnosis not present

## 2018-08-23 ENCOUNTER — Telehealth: Payer: Self-pay | Admitting: *Deleted

## 2018-08-23 NOTE — Telephone Encounter (Signed)
Fax received from [employer], phone# [phone].  Modafinil approved for dates 07/11/18-08/23/19. Member ID: [id number]. No PA#/fim

## 2018-08-23 NOTE — Telephone Encounter (Signed)
Submitted PA modafinil 200mg  tablet on covermymeds. Key: [id number]. PA approved as non-formulary exception 07/11/2018 - 08/23/2019. Member ID Number: [id number].

## 2018-09-04 ENCOUNTER — Telehealth: Payer: Self-pay | Admitting: Neurology

## 2018-09-04 ENCOUNTER — Ambulatory Visit: Payer: Medicare Other | Admitting: Neurology

## 2018-09-04 MED ORDER — OXYCODONE-ACETAMINOPHEN 5-325 MG PO TABS: ORAL_TABLET | ORAL | 0 refills | Status: DC

## 2018-09-04 NOTE — Telephone Encounter (Signed)
I called back. Pt throwing up this am. He took day off work. Father was going to bring her to appt prior to this but feels she cannot come to appt. Has not checked her temperature yet. I recommended he do this. He will have her f/u with PCP if sx worsen. Rescheduled appt to 09/13/18 at 1pm. He verbalized understanding. She will have JCV ab lab checked at appt. Advised I will send refill request for oxycodone to Dr. Epimenio Foot to send to pharmacy. He verbalized understanding.

## 2018-09-04 NOTE — Addendum Note (Signed)
Addended by: Despina Arias A on: 09/04/2018 11:46 AM   Modules accepted: Orders

## 2018-09-04 NOTE — Telephone Encounter (Signed)
Devora,Charles called for pt stating pt is vomitting and unsure if she should come to appointment.  Pt is in need of the refill on oxyCODONE-acetaminophen (PERCOCET/ROXICET) 5-325 MG tablet Liford,Charles wants a call from RN to advise if pt should still come to appointment (with a mask) or if pt could still get refill on oxyCODONE-acetaminophen (PERCOCET/ROXICET) 5-325 MG tablet and reschedule.  Please call pt

## 2018-09-04 NOTE — Addendum Note (Signed)
Addended by: Hillis Range on: 09/04/2018 09:54 AM   Modules accepted: Orders

## 2018-09-10 DIAGNOSIS — G35 Multiple sclerosis: Secondary | ICD-10-CM | POA: Diagnosis not present

## 2018-09-13 ENCOUNTER — Ambulatory Visit (INDEPENDENT_AMBULATORY_CARE_PROVIDER_SITE_OTHER): Payer: Medicare Other | Admitting: Neurology

## 2018-09-13 ENCOUNTER — Encounter: Payer: Self-pay | Admitting: Neurology

## 2018-09-13 VITALS — BP 125/80 | HR 72 | Ht 60.0 in | Wt 78.5 lb

## 2018-09-13 DIAGNOSIS — R208 Other disturbances of skin sensation: Secondary | ICD-10-CM

## 2018-09-13 DIAGNOSIS — M5432 Sciatica, left side: Secondary | ICD-10-CM | POA: Diagnosis not present

## 2018-09-13 DIAGNOSIS — G8929 Other chronic pain: Secondary | ICD-10-CM

## 2018-09-13 DIAGNOSIS — R269 Unspecified abnormalities of gait and mobility: Secondary | ICD-10-CM

## 2018-09-13 DIAGNOSIS — R5383 Other fatigue: Secondary | ICD-10-CM

## 2018-09-13 DIAGNOSIS — G35 Multiple sclerosis: Secondary | ICD-10-CM

## 2018-09-13 NOTE — Progress Notes (Signed)
u  [facility]  PATIENT: Sabrina [name] DOB: 11-May-1981    _________________________________   HISTORICAL  CHIEF COMPLAINT:  Chief Complaint  Patient presents with  . Follow-up    RM 13, alone. Last seen 05/03/18. Doing ok, no changes. Still having pain. Left leg worse then the right leg. Still having sciatic pain.   . Multiple Sclerosis    On Tysabri, tolerating. JCV lab drawn 05/03/18: negative 0.17. Last infusion:09/10/18. Next infusion: 10/10/18    HISTORY OF PRESENT ILLNESS:  Sabrina [name] is a 38 y.o. woman with multiple sclerosis and chronic pain.     Update 09/13/2018: She is on Tysabri.   She feels her MS has been fairly stable.  No exacerbations.    She is JCV Ab negative (last one 0.17).    She does her infusions in [place].  Her gait is about the same with reduced endurance and occasional foot drop.   She often uses a cane.  Her back pain is mostly in the buttocks, hip and legs all the way to the foot.  Left leg and hip are worse than the right.     Balance is poor.     Vision is ok.    She notes urinary hesitancy.  She has occasional nocturnal incontinence.    Oxycodone and Flexeril helps the pain some.   Lamotrigine helps the dysesthesia some.   She has fatigue, helped by modafinil.    She has gained a couple pounds but is not sure if any benefit from Megace.   Mood is about the same with mild depression, better than 2 years ago.  Update 05/03/18: She feels her MS has been stable.  She denies any exacerbations.  She is on Tysabri with her last infusion 04/27/2018 generally when she takes Tysabri, she feels very tired that day but then better the rest of the month.   She has some fluctuations in symptoms.   She is walking ok and uses a cane for longer distance.    Balance is off.    No recent falls.    She has pain that is most severe in the left leg and hip.    She is on Percocet with benefit and is not escalating.     She has anxiety helped by  clonazepam (0.5 mg tid)  Her appetite is ok but she still has not gained any weight recently.   Megace has not helped much.   Update 01/01/2018: Her MS has been doing about the same.  Specifically, she has not had any exacerbations.  She is on Tysabri and she gets her infusions in [place].  Her JCV antibodies have been negative.  She still has some difficulties with her gait being mildly wide or balance at times.  She notes no major problems with focal weakness (does feel generalized weak).   She does have some difficulties with dysesthetic pain.  Cymbalta helped some when we added that at the last visit.    She is reporting more pain in her left lower back/buttock and down the left leg.    Her fatigue has been worse.    She gets some benefit from Provigil but the benefit seems less than it was in the past and wears off by early afternoon.  She has gained a few pounds.   Appetite is still poor.     Update 11/23/2017: Her MS has been stable with no new exacerbations.   She is on Tysabri and she tolerates it well.  She does her infusions in [place], last one yesterday.    Hr JCV ws negative at 0.27 July 2017.   Neurologically she is doing well.  Her gait is actually better than it was a couple years ago and she no longer needs to use a cane.  Her stride is good though she still feels a little off balance at times.  She denies any significant weakness or numbness in the limbs.  She has some urinary urgency with occasional incontinence.  Vision is fine.  Her fatigue has done as well on Provigil as I did on Adderall.  She continues to have difficulty with her weight and weights only 82 pounds.   She eats and wish she would gain weight.  She is off the Adderall.     She repots a lot of pain, especially in the left hip and buttock.   Sometimes the right hip hurts more when she puts weight on it.     Currently, she is on oxycodone 5 mg p.o. 3 times daily.  Update 07/26/2017: She is doing her monthly  Tysabri in [place]. She tolerates the infusions well. She has done much better on Tysabri than on Copaxone. She is JCV antibody negative. The last test was 10/17/2016 and was negative and 0.17. Imbalance is unchanged.    She has had a couple falls with tripping but is no worse..  . She is able to walk without a cane but will sometimes use a cane or walker if she feels weaker or more tired. He has painful dysesthesias in her legs.    She has mildly reduced vision out of the right eye. She has urinary frequency and urgency. This is fairly stable and she has rare night time incontinence.     He continues to report fatigue.  She has been prescribed Adderall but does not take on a regular basis. Her weight is a little lower than the last visit at only 86 pounds. Megace had not helped her weight gain. Sleep is variable and she still has some trouble falling asleep and staying asleep. She notes some depression but feels better currently than much of last year. She notes some decreased focus and attention, also helped by Adderall when she takes it.  She has bilateral sciatica with pain in the buttocks radiating to the legs. Trigger point injections have helped (piriformis).   Lumbar MRI was ok.   Lamotrigine helps some.     Hydrocodone helps the pain.      From 02/28/2017: She went to [facility] last week after noting more fogginess and having more pain and weakness in her legs, especially the right leg.   She fell down in the seat and couch having trouble getting up.   She was more emotional and foggy headed.   She went to [facility] ED and had MRI's.  The brain looks the same.     She has multiple thoracic spine lesions and one at C6C7 looked expansile but did not enhance (unclear if subacute or not).   She had several days of IV Steroids.    She is now feeling back to her baseline. I personally reviewed the MRI of the brain and MRI of the thoracic spine performed during her recent hospitalization. The MRI of the  brain shows many T2/FLAIR hyperintense foci in the periventricular, juxtacortical and deep white matter. Her to be any change when compared to her previous MRI. The MRI of the thoracic spine shows multiple T2 hyperintense  lesions. The T6-T7 lesion is fairly large and there could be slight expansion of the spinal cord. However, there is no enhancement.      MS:   She continues to do monthly Tysabri (in [place]) since 2016.   She is tolerating the infusions well.   This is her first possible exacerbations since starting Tysabri. She has done much better on Tysabri that she did on Copaxone.  Gait/strength/sensation/pain:   Currently her gait is back to baseline. She is able to walk without a cane most of the time. However, when pain accepts uses cane or walker. Mild weakness in her legs. There are some painful dysesthesias in the legs.  Vision:      She notes no change in the right blurry vision.    She denies any diplopia.  Sciatica/pain:  She continues to experience sciatic type pain in the back and left leg. In the past, piriformis injections have helped her. Pain is not too bad since she received several days of steroids to the hospital.  In the past, she received the best benefit with Botox but her co-pay was too high.  Other med's including gabapentin, Lyrica and Tegretol are not well tolerated.  Lamictal helps the pain some.      Lumbar MRI was normal.      Bladder:  She notes stable urinary frequency and urgency and has nocturnal incontinence once a month.  She has 1 - 2 times nocturia and sometimes has nocturnal incontinence.   In the past she was on oxybutynin with benefit.  She denies hesitrancy.   Fatigue/sleep:   She has fatigue is physical more than mental. Adderall has helped her fatigue quite a bit. .  Even though she has weight loss, she feels appetite is not suppressed.    Sometimes, she will take only one Adderall pill and occasionally does not take any..       She feels she sleeps much  better now than last year and RLS/PLMS is better.    Mood/cognition:   Depression and anxiety are doing a little bit better this visit than last visit..  She notes stress with family and the holidays.   She has had some panic attacks and clonazepam helps the most.   She notes mild cognitive issues with poor focus and attention and reduced verbal fluency.    She is on Pristiq and lamotrigine with benefit  MS History:  She had right optic neuritis in December 2012 and had an MRI of the brain showing optic nerve inflammation and many white matter spots.    She saw Dr. Macon Large in [place].   She was started on Copaxone.  She likely had an exacerbation in 2013 when she had trouble walking x 2 months.   She received a few days of IV Solu-Medrol.  Last year, she also received a few days of IV Solu-Medrol but she is uncertain   She felt she did well for the first 2 years but switched to Gilenya because she was tired of the shots last year.   At first, she tolerated it well but then her depression got much worse and she had some stomach issues so she returned to Copaxone last month (20 mg daily).   MRI early 2016 showed multiple old MS plaques and 1  Enhancing focus in the right frontal lobe prompting change to Tysabi.     REVIEW OF SYSTEMS: Constitutional: No fevers, chills, sweats, or change in appetite.  Notes a lot of fatigue  Eyes: No visual changes, double vision, eye pain Ear, nose and throat: No hearing loss, ear pain, nasal congestion, sore throat Cardiovascular: No chest pain, palpitations Respiratory: No shortness of breath at rest or with exertion.   No wheezes GastrointestinaI: No nausea, vomiting, diarrhea, abdominal pain, fecal incontinence Genitourinary: as above. Musculoskeletal: No neck pain, back pain Integumentary: No rash, pruritus, skin lesions Neurological: as above Psychiatric: Notes some depression and anxiety Endocrine: No palpitations, diaphoresis, change in appetite, change in  weigh or increased thirst Hematologic/Lymphatic: No anemia, purpura, petechiae. Allergic/Immunologic: No itchy/runny eyes, nasal congestion, recent allergic reactions, rashes  ALLERGIES: Allergies  Allergen Reactions  . Amoxicillin Anaphylaxis  . Penicillins Anaphylaxis  . Sulfa Antibiotics Other (See Comments)    unknown  . Codeine Nausea And Vomiting    HOME MEDICATIONS:  Current Outpatient Medications:  .  clonazePAM (KLONOPIN) 0.5 MG tablet, TAKE 1 TABLET BY MOUTH THREE TIMES DAILY AS NEEDED, Disp: 60 tablet, Rfl: 5 .  cyclobenzaprine (FLEXERIL) 5 MG tablet, Take 1 tablet (5 mg total) by mouth every 8 (eight) hours as needed for muscle spasms., Disp: 90 tablet, Rfl: 5 .  DULoxetine (CYMBALTA) 60 MG capsule, TAKE 1 CAPSULE(60 MG) BY MOUTH DAILY, Disp: 90 capsule, Rfl: 3 .  feeding supplement, ENSURE ENLIVE, (ENSURE ENLIVE) LIQD, Take 237 mLs by mouth 2 (two) times daily between meals., Disp: 237 mL, Rfl: 12 .  lamoTRIgine (LAMICTAL) 100 MG tablet, TAKE 1 TABLET(100 MG) BY MOUTH TWICE DAILY, Disp: 180 tablet, Rfl: 3 .  megestrol (MEGACE) 40 MG tablet, Take 1 tablet (40 mg total) by mouth daily., Disp: 30 tablet, Rfl: 5 .  modafinil (PROVIGIL) 200 MG tablet, Take 1 tablet (200 mg total) by mouth daily., Disp: 30 tablet, Rfl: 5 .  natalizumab (TYSABRI) 300 MG/15ML injection, Inject 15 mLs (300 mg total) into the vein every 30 (thirty) days., Disp: 15 mL, Rfl: 12 .  natalizumab (TYSABRI) 300 MG/15ML injection, Prescription for month of November 2019. Inject 15mL IV every 30days, Disp: 15 mL, Rfl: 0 .  oxybutynin (DITROPAN) 5 MG tablet, Take 1 tablet (5 mg total) by mouth 2 (two) times daily as needed for bladder spasms., Disp: 60 tablet, Rfl: 11 .  oxyCODONE-acetaminophen (PERCOCET/ROXICET) 5-325 MG tablet, One po qid prn, Disp: 120 tablet, Rfl: 0 .  ranitidine (ZANTAC) 150 MG tablet, Take 150 mg by mouth 2 (two) times daily., Disp: , Rfl:   PAST MEDICAL HISTORY: Past Medical History:    Diagnosis Date  . Chronic pain syndrome   . Depression   . Fibromyalgia   . GAD (generalized anxiety disorder)   . Headache   . Multiple sclerosis (HCC) 10/28/2014  . Obsessive compulsive disorder   . Vision abnormalities     PAST SURGICAL HISTORY: Past Surgical History:  Procedure Laterality Date  . NO PAST SURGERIES      FAMILY HISTORY: Family History  Problem Relation Age of Onset  . Healthy Mother   . Diabetes type II Father   . Prostate cancer Father     SOCIAL HISTORY:  Social History   Socioeconomic History  . Marital status: Married    Spouse name: Not on file  . Number of children: Not on file  . Years of education: Not on file  . Highest education level: Not on file  Occupational History  . Not on file  Social Needs  . Financial resource strain: Not on file  . Food insecurity:    Worry: Not on file  Inability: Not on file  . Transportation needs:    Medical: Not on file    Non-medical: Not on file  Tobacco Use  . Smoking status: Current Every Day Smoker    Packs/day: 0.50    Types: Cigarettes  . Smokeless tobacco: Never Used  Substance and Sexual Activity  . Alcohol use: No    Alcohol/week: 0.0 standard drinks  . Drug use: No  . Sexual activity: Not on file  Lifestyle  . Physical activity:    Days per week: Not on file    Minutes per session: Not on file  . Stress: Not on file  Relationships  . Social connections:    Talks on phone: Not on file    Gets together: Not on file    Attends religious service: Not on file    Active member of club or organization: Not on file    Attends meetings of clubs or organizations: Not on file    Relationship status: Not on file  . Intimate partner violence:    Fear of current or ex partner: Not on file    Emotionally abused: Not on file    Physically abused: Not on file    Forced sexual activity: Not on file  Other Topics Concern  . Not on file  Social History Narrative  . Not on file      PHYSICAL EXAM  Vitals:   09/13/18 1310  BP: 125/80  Pulse: 72  Weight: 78 lb 8 oz (35.6 kg)  Height: 5' (1.524 m)    Body mass index is 15.33 kg/m.   General: The patient is well-developed and well-nourished and in no acute distress  Musculoskeletal:   She is tender over the piriformis muscles/sacroiliac joints bilaterally, more on the left.  She has very poor posture.  Neurologic Exam  Mental status:  The patient is alert and oriented x 3 at the time of the examination. The patient has apparent normal recent and remote memory, with a mildly reduced attention span and concentration ability.     Cranial nerves: Extraocular movements are full.  Facial strength and sensation was normal.  Trapezius strength was normal.  Hearing was normal and symmetric.  Motor:  Muscle bulk is normal.   Muscle tone is mildly increased in legs. Strength is 5/5.Marland Kitchen   Sensory: She has normal sensation in her arms.  She has slightly reduced vibration sensation in the toes. Coordination: Cerebellar testing reveals good finger-nose-finger bilaterally.  She has mildly reduced heel-to-shin bilaterally.  Gait and station: Station is normal.  The gait is mildly wide.  Her tandem gait is poor.. Romberg is negative.  Reflexes: Deep tendon reflexes are normal in the arms increased bilaterally with nonsustained clonus at the ankles    DIAGNOSTIC DATA (LABS, IMAGING, TESTING) - I reviewed patient records, labs, notes, testing and imaging myself where available.       ASSESSMENT AND PLAN  Multiple sclerosis (HCC) - Plan: Stratify JCV Antibody Test ([facility]), CBC with Differential/Platelet  Left sided sciatica  Other chronic pain  Gait disorder  Dysesthesia  Other fatigue   1.  Continue Tysabri 300 mg every 4 weeks.  We will check JCV antibody and CBC with differential today.  She will continue her Tysabri infusions in [place].   2.  Continue clonazepam for anxiety and spasms,  oxycodone for pain and modafinil for fatigue and sleepiness.  Continue lamotrigine 100 mg twice a day for dysesthesias.  3.    rtc 5 months,  or sooner if new or worsening neurologic symptoms  Song Garris A. Epimenio Foot, MD, PhD 09/13/2018, 1:45 PM Certified in Neurology, Clinical Neurophysiology, Sleep Medicine, Pain Medicine and Neuroimaging  [facility] [address], [address] [place], [place] [address] [phone]

## 2018-09-14 LAB — CBC WITH DIFFERENTIAL/PLATELET
Basophils Absolute: 0.1 10*3/uL (ref 0.0–0.2)
Basos: 1 %
EOS (ABSOLUTE): 0.2 10*3/uL (ref 0.0–0.4)
Eos: 2 %
Hematocrit: 42.6 % (ref 34.0–46.6)
Hemoglobin: 15.1 g/dL (ref 11.1–15.9)
Immature Grans (Abs): 0 10*3/uL (ref 0.0–0.1)
Immature Granulocytes: 1 %
Lymphocytes Absolute: 3.6 10*3/uL — ABNORMAL HIGH (ref 0.7–3.1)
Lymphs: 41 %
MCH: 31.3 pg (ref 26.6–33.0)
MCHC: 35.4 g/dL (ref 31.5–35.7)
MCV: 88 fL (ref 79–97)
Monocytes Absolute: 0.7 10*3/uL (ref 0.1–0.9)
Monocytes: 7 %
Neutrophils Absolute: 4.3 10*3/uL (ref 1.4–7.0)
Neutrophils: 48 %
Platelets: 286 10*3/uL (ref 150–450)
RBC: 4.82 x10E6/uL (ref 3.77–5.28)
RDW: 12.7 % (ref 11.7–15.4)
WBC: 8.8 10*3/uL (ref 3.4–10.8)

## 2018-09-24 ENCOUNTER — Telehealth: Payer: Self-pay | Admitting: *Deleted

## 2018-09-24 NOTE — Telephone Encounter (Signed)
JCV lab drawn on 09/13/18 negative, index: 0.14.

## 2018-10-02 ENCOUNTER — Other Ambulatory Visit: Payer: Self-pay | Admitting: Neurology

## 2018-10-02 MED ORDER — OXYCODONE-ACETAMINOPHEN 5-325 MG PO TABS: ORAL_TABLET | ORAL | 0 refills | Status: DC

## 2018-10-02 NOTE — Telephone Encounter (Signed)
Pt is needing a refill on her oxyCODONE-acetaminophen (PERCOCET/ROXICET) 5-325 MG tablet sent to [facility] in [place]

## 2018-10-10 ENCOUNTER — Telehealth: Payer: Self-pay | Admitting: Neurology

## 2018-10-10 NOTE — Telephone Encounter (Signed)
I called pt back. Relayed Dr. Bonnita Hollow recommendation. She was scheduled today but she is able to go tomorrow. She will call hospital back to go over what precautions they are taking. I recommended she wear a mask while she is there, avoid large groups, stay 6 ft away from others. Wash hands with warm soap and water. When she gets home, she should take clothes off before going into home and throw in wash. Then take a shower. She verbalized understanding and appreciation.

## 2018-10-10 NOTE — Telephone Encounter (Signed)
Pt is asking for a call from RN, she states she is concerned and unsure if she should got to the hospital for her Tysabri.  Please call

## 2018-10-10 NOTE — Telephone Encounter (Signed)
Spoke with Dr. Epimenio Foot- It is ok for her to get infusion. She should stay 35ft or more away from other infusion pts. If she is still concerned, she can extend to every 6 weeks for a few doses, no he does not want her going more than 8 weeks between doses. Tysabri also does not increase her risk like some other MS medications.

## 2018-10-12 DIAGNOSIS — Z885 Allergy status to narcotic agent status: Secondary | ICD-10-CM | POA: Diagnosis not present

## 2018-10-12 DIAGNOSIS — Z882 Allergy status to sulfonamides status: Secondary | ICD-10-CM | POA: Diagnosis not present

## 2018-10-12 DIAGNOSIS — Z880 Allergy status to penicillin: Secondary | ICD-10-CM | POA: Diagnosis not present

## 2018-10-12 DIAGNOSIS — G35 Multiple sclerosis: Secondary | ICD-10-CM | POA: Diagnosis not present

## 2018-10-31 ENCOUNTER — Other Ambulatory Visit: Payer: Self-pay | Admitting: Neurology

## 2018-10-31 MED ORDER — OXYCODONE-ACETAMINOPHEN 5-325 MG PO TABS: ORAL_TABLET | ORAL | 0 refills | Status: DC

## 2018-10-31 NOTE — Telephone Encounter (Signed)
Pt called in for a refill of oxyCODONE-acetaminophen (PERCOCET/ROXICET) 5-325 MG tablet to be sent to [id number] - [place], [place] - [address] AT [facility] [address]

## 2018-11-16 ENCOUNTER — Other Ambulatory Visit: Payer: Self-pay | Admitting: Neurology

## 2018-11-19 ENCOUNTER — Telehealth: Payer: Self-pay | Admitting: Neurology

## 2018-11-19 ENCOUNTER — Telehealth: Payer: Self-pay | Admitting: *Deleted

## 2018-11-19 DIAGNOSIS — G35 Multiple sclerosis: Secondary | ICD-10-CM | POA: Diagnosis not present

## 2018-11-19 NOTE — Telephone Encounter (Signed)
Dr. Epimenio Foot already escribed rx to pharmacy today.

## 2018-11-19 NOTE — Telephone Encounter (Signed)
Received fax notification from touch prescribing program that pt re-authorized from 11/19/18-06/19/19. Enrollment number: [id number]. Account: [facility] [place], [place]. Site auth number: [id number].

## 2018-11-19 NOTE — Telephone Encounter (Signed)
Pt has called for a refill on her modafinil (PROVIGIL) 200 MG tablet  °[id number]  °

## 2018-11-19 NOTE — Addendum Note (Signed)
Addended by: Hillis Range on: 11/19/2018 03:01 PM   Modules accepted: Orders

## 2018-11-19 NOTE — Telephone Encounter (Signed)
Faxed completed/signed Tysabri pt status report and reauth questionnaire to [place] touch at [phone]. Received confirmation.  

## 2018-11-20 ENCOUNTER — Telehealth: Payer: Self-pay | Admitting: *Deleted

## 2018-11-20 NOTE — Telephone Encounter (Signed)
Called pt back. Advised I received message from [place], [place] yesterday that she received a message from her that she is not scheduled for her Tysabri until 12/07/18. Last infusion being 10/10/18.  Pt states they called back and r/s her. She received Tysabri infusion yesterday (11/19/18). Appt was at 8am. Nothing further needed. She receives infusions at [facility].

## 2018-11-29 ENCOUNTER — Other Ambulatory Visit: Payer: Self-pay | Admitting: Neurology

## 2018-11-29 MED ORDER — OXYCODONE-ACETAMINOPHEN 5-325 MG PO TABS: ORAL_TABLET | ORAL | 0 refills | Status: DC

## 2018-11-29 NOTE — Telephone Encounter (Signed)
Pt has called for a refill on her oxyCODONE-acetaminophen (PERCOCET/ROXICET) 5-325 MG tablet [facility] [phone]

## 2018-11-29 NOTE — Addendum Note (Signed)
Addended by: Hillis Range on: 11/29/2018 10:22 AM   Modules accepted: Orders

## 2019-01-01 ENCOUNTER — Other Ambulatory Visit: Payer: Self-pay | Admitting: Neurology

## 2019-01-01 MED ORDER — OXYCODONE-ACETAMINOPHEN 5-325 MG PO TABS: ORAL_TABLET | ORAL | 0 refills | Status: DC

## 2019-01-01 NOTE — Telephone Encounter (Signed)
I have routed this request to Dr Brett Fairy for review. The pt is due for the medication and [place] registry was verified. I have corrected the fill date to be 01/03/2019 since last fill was 12/03/2018.

## 2019-01-01 NOTE — Telephone Encounter (Signed)
.  Pt is requesting a refill of oxyCODONE-acetaminophen (PERCOCET/ROXICET) 5-325 MG tablet  , to be sent to [facility] [place] [facility] [address]

## 2019-01-14 DIAGNOSIS — G35 Multiple sclerosis: Secondary | ICD-10-CM | POA: Diagnosis not present

## 2019-01-14 DIAGNOSIS — Z881 Allergy status to other antibiotic agents status: Secondary | ICD-10-CM | POA: Diagnosis not present

## 2019-01-14 DIAGNOSIS — Z880 Allergy status to penicillin: Secondary | ICD-10-CM | POA: Diagnosis not present

## 2019-01-14 DIAGNOSIS — Z882 Allergy status to sulfonamides status: Secondary | ICD-10-CM | POA: Diagnosis not present

## 2019-01-14 DIAGNOSIS — Z885 Allergy status to narcotic agent status: Secondary | ICD-10-CM | POA: Diagnosis not present

## 2019-01-15 DIAGNOSIS — Z885 Allergy status to narcotic agent status: Secondary | ICD-10-CM | POA: Diagnosis not present

## 2019-01-15 DIAGNOSIS — Z882 Allergy status to sulfonamides status: Secondary | ICD-10-CM | POA: Diagnosis not present

## 2019-01-15 DIAGNOSIS — G35 Multiple sclerosis: Secondary | ICD-10-CM | POA: Diagnosis not present

## 2019-01-15 DIAGNOSIS — Z881 Allergy status to other antibiotic agents status: Secondary | ICD-10-CM | POA: Diagnosis not present

## 2019-01-15 DIAGNOSIS — Z880 Allergy status to penicillin: Secondary | ICD-10-CM | POA: Diagnosis not present

## 2019-01-16 ENCOUNTER — Other Ambulatory Visit: Payer: Self-pay | Admitting: Neurology

## 2019-01-26 ENCOUNTER — Other Ambulatory Visit: Payer: Self-pay | Admitting: Neurology

## 2019-01-30 ENCOUNTER — Other Ambulatory Visit: Payer: Self-pay | Admitting: Neurology

## 2019-01-30 MED ORDER — OXYCODONE-ACETAMINOPHEN 5-325 MG PO TABS: ORAL_TABLET | ORAL | 0 refills | Status: DC

## 2019-01-30 NOTE — Telephone Encounter (Signed)
Pt is requesting a refill of oxyCODONE-acetaminophen (PERCOCET/ROXICET) 5-325 MG tablet , to be sent to [id number] - [place], [place] - [address] AT [facility] [address]

## 2019-02-01 ENCOUNTER — Telehealth: Payer: Self-pay | Admitting: Neurology

## 2019-02-01 NOTE — Telephone Encounter (Signed)
Pt's mother, on DPR called stating that the pt is having a flare up. Pt is barley able to lift L leg, feeling very week, lower back is hurting really bad, she is off balance and is having cloudy thoughts. Please advise.

## 2019-02-01 NOTE — Telephone Encounter (Signed)
I called her mother, patient complains of leg weakness, left leg is worse, cloudy thought, extreme fatigue, this has been going on for 3 days.  After discussion with her mother, we decided to hold off steroid treatment for now.  Terrence Dupont, please call her Monday to check on her again,

## 2019-02-04 NOTE — Telephone Encounter (Signed)
Called and LVM for mother advising I was calling to get update on how Sabrina [name] was doing since they spoke with Dr. Krista Blue. Advised phones are down, IT currently working on resolving issue. Advised they can also send mychart message with update as well

## 2019-02-05 NOTE — Telephone Encounter (Signed)
Called and spoke with patient. She states she is feeling a little better since speaking with Dr. Krista Blue. She has stayed inside the past few days to stay away from the heat. She is unable to lift her left leg higher than a couple inches. Pain radiates from hip to lower back on left side which is new for her. Pain worsens when trying to put on pants/sitting. C/o cognition issues, balance problems, cloudy/blurry vsion intermittently throughout the day. Denies any signs/sx of UTI. Reports end of June, she had an abscess tooth and had three teeth extracted.   Last Tysabri infusion: around 01/23/2019 Next infusion: 02/20/2019 She has f/u 03/19/19 with Amy L, NP.   Advised I will speak with Dr. Felecia Shelling about next steps and call back. She verbalized understanding.

## 2019-02-05 NOTE — Telephone Encounter (Signed)
Called and spoke with pt. Relayed Dr. Garth Bigness recommendation. She verbalized understanding and appreciation for call.

## 2019-02-05 NOTE — Telephone Encounter (Signed)
Spoke with Dr. Felecia Shelling. He recommends she continue to monitor symptoms. If she has any new or worsening symptoms she should let us know.

## 2019-02-27 DIAGNOSIS — G35 Multiple sclerosis: Secondary | ICD-10-CM | POA: Diagnosis not present

## 2019-02-28 ENCOUNTER — Other Ambulatory Visit: Payer: Self-pay | Admitting: Neurology

## 2019-02-28 MED ORDER — OXYCODONE-ACETAMINOPHEN 5-325 MG PO TABS: ORAL_TABLET | ORAL | 0 refills | Status: DC

## 2019-02-28 NOTE — Telephone Encounter (Signed)
Pt is needing a refill on her oxyCODONE-acetaminophen (PERCOCET/ROXICET) 5-325 MG tablet sent to the [facility] on [address].

## 2019-03-19 ENCOUNTER — Ambulatory Visit: Payer: Medicare Other | Admitting: Family Medicine

## 2019-03-28 DIAGNOSIS — G35 Multiple sclerosis: Secondary | ICD-10-CM | POA: Diagnosis not present

## 2019-04-01 ENCOUNTER — Other Ambulatory Visit: Payer: Self-pay

## 2019-04-01 MED ORDER — OXYCODONE-ACETAMINOPHEN 5-325 MG PO TABS: ORAL_TABLET | ORAL | 0 refills | Status: DC

## 2019-04-01 NOTE — Telephone Encounter (Addendum)
I called pt to find out what medication she needed a refill on. Pt states she needs refill on oxycodone. I checked drug registry and she last refilled rx 03/04/19 #120. She can fill on or after 04/03/19 for next. I post-dated. Last seen 09/13/18 and next f/u 05/13/19

## 2019-04-29 ENCOUNTER — Telehealth: Payer: Self-pay | Admitting: Neurology

## 2019-04-29 ENCOUNTER — Other Ambulatory Visit: Payer: Self-pay | Admitting: Neurology

## 2019-04-29 DIAGNOSIS — G35 Multiple sclerosis: Secondary | ICD-10-CM

## 2019-04-29 MED ORDER — NATALIZUMAB 300 MG/15ML IV CONC: 300.0000 mg | INTRAVENOUS | 11 refills | Status: DC

## 2019-04-29 MED ORDER — OXYCODONE-ACETAMINOPHEN 5-325 MG PO TABS: ORAL_TABLET | ORAL | 0 refills | Status: DC

## 2019-04-29 NOTE — Telephone Encounter (Signed)
Phone rep checked office voicemail from 10:40, pt called asking for a refill on her oxyCODONE-acetaminophen (PERCOCET/ROXICET) 5-325 MG tablet [place] [phone]   Pt also stated she was told by the [facility] to let Dr Felecia Shelling know that she needs another 6 months of Tysabri. Please call

## 2019-04-29 NOTE — Telephone Encounter (Signed)
Duplicate, see other phone note from today.

## 2019-04-29 NOTE — Telephone Encounter (Signed)
Pt is needing a refill on her oxyCODONE-acetaminophen (PERCOCET/ROXICET) 5-325 MG tablet sent to the [facility] in [place], [place] She also is needing a refill on her natalizumab (TYSABRI) 300 MG/15ML injection

## 2019-04-29 NOTE — Telephone Encounter (Signed)
Checked drug registry. She last refilled rx oxycodone 04/03/2019 #120. Last seen 09/13/18 and next f/u 05/13/2019.  Appears she cx f/u that was with AL,NP 03/19/2019.  Printed rx Tysabri to fax to [facility] at [phone]

## 2019-05-13 ENCOUNTER — Ambulatory Visit: Payer: Self-pay | Admitting: Neurology

## 2019-05-13 ENCOUNTER — Encounter: Payer: Self-pay | Admitting: Neurology

## 2019-05-17 ENCOUNTER — Other Ambulatory Visit: Payer: Self-pay | Admitting: Neurology

## 2019-05-19 ENCOUNTER — Other Ambulatory Visit: Payer: Self-pay | Admitting: Neurology

## 2019-05-20 ENCOUNTER — Telehealth: Payer: Self-pay | Admitting: Neurology

## 2019-05-20 ENCOUNTER — Telehealth: Payer: Self-pay | Admitting: *Deleted

## 2019-05-20 NOTE — Telephone Encounter (Signed)
Already pending MD approval

## 2019-05-20 NOTE — Telephone Encounter (Signed)
Called and spoke with mother. Scheduled sooner appt for tomorrow at 845am with Dr. Felecia Shelling. Advised them to both wear masks and to check in no later than 830am. She verbalized understanding.   Faxed completed/signed Tysabri pt status report and reauth questionnaire to [facility] touch at [phone]. Received confirmation.

## 2019-05-20 NOTE — Telephone Encounter (Signed)
Pt has called for a refill on her modafinil (PROVIGIL) 200 MG tablet  [facility] [phone]

## 2019-05-21 ENCOUNTER — Ambulatory Visit (INDEPENDENT_AMBULATORY_CARE_PROVIDER_SITE_OTHER): Payer: Medicare Other | Admitting: Neurology

## 2019-05-21 ENCOUNTER — Encounter: Payer: Self-pay | Admitting: Neurology

## 2019-05-21 ENCOUNTER — Other Ambulatory Visit: Payer: Self-pay

## 2019-05-21 ENCOUNTER — Telehealth: Payer: Self-pay | Admitting: *Deleted

## 2019-05-21 VITALS — BP 102/80 | HR 77 | Temp 97.5°F | Ht 64.0 in | Wt 87.0 lb

## 2019-05-21 DIAGNOSIS — G8929 Other chronic pain: Secondary | ICD-10-CM

## 2019-05-21 DIAGNOSIS — R5383 Other fatigue: Secondary | ICD-10-CM

## 2019-05-21 DIAGNOSIS — G35 Multiple sclerosis: Secondary | ICD-10-CM

## 2019-05-21 DIAGNOSIS — E559 Vitamin D deficiency, unspecified: Secondary | ICD-10-CM

## 2019-05-21 DIAGNOSIS — F329 Major depressive disorder, single episode, unspecified: Secondary | ICD-10-CM | POA: Diagnosis not present

## 2019-05-21 DIAGNOSIS — R208 Other disturbances of skin sensation: Secondary | ICD-10-CM | POA: Diagnosis not present

## 2019-05-21 DIAGNOSIS — R7989 Other specified abnormal findings of blood chemistry: Secondary | ICD-10-CM | POA: Diagnosis not present

## 2019-05-21 DIAGNOSIS — R269 Unspecified abnormalities of gait and mobility: Secondary | ICD-10-CM | POA: Diagnosis not present

## 2019-05-21 DIAGNOSIS — M62838 Other muscle spasm: Secondary | ICD-10-CM

## 2019-05-21 DIAGNOSIS — F32A Depression, unspecified: Secondary | ICD-10-CM

## 2019-05-21 MED ORDER — LAMOTRIGINE 100 MG PO TABS: ORAL_TABLET | ORAL | 3 refills | Status: DC

## 2019-05-21 NOTE — Progress Notes (Signed)
u  [facility]  PATIENT: Sabrina [name] DOB: 14-Aug-1980    _________________________________   HISTORICAL  CHIEF COMPLAINT:  Chief Complaint  Patient presents with   Follow-up    RM 12, alone.   Multiple Sclerosis    On Tysabri. Last JCV ab 09/13/18: negative, 0.14. NEEDS LABS TODAY    HISTORY OF PRESENT ILLNESS:  Sabrina [name] is a 38 y.o. woman with multiple sclerosis and chronic pain.     Update 05/21/19 She is on Tysabri.    She tolerates it well (infusions in [place]).  Her last JCV Ab was negative at 0.14.   She had one episode x3 weeks where her left leg seemed weaker.  Her left leg is better again but she still feels it is weaker than her right leg.      She has fatigue and is helped by Modafinil.   She is sleeping well at night and takes Flexeril some nights.  .   She has anxiety, helped by clonazepam every morning.      She has left hip and leg pain.     She has had piriformis syndrome on the left in the past.     She has gained weight from 78 pounds to 87 pounds (weight was 120-130 before MS).   She takes Ensure daily   She sometimes takes Megace.  Vision is mildly worse over the past year (she has astigmatism).   Colors are less bright OS.    Reading is worse in the distance.        Update 09/13/2018: She is on Tysabri.   She feels her MS has been fairly stable.  No exacerbations.    She is JCV Ab negative (last one 0.17).    She does her infusions in [place].  Her gait is about the same with reduced endurance and occasional foot drop.   She often uses a cane.  Her back pain is mostly in the buttocks, hip and legs all the way to the foot.  Left leg and hip are worse than the right.     Balance is poor.     Vision is ok.    She notes urinary hesitancy.  She has occasional nocturnal incontinence.    Oxycodone and Flexeril helps the pain some.   Lamotrigine helps the dysesthesia some.   She has fatigue, helped by modafinil.    She has gained a  couple pounds but is not sure if any benefit from Megace.   Mood is about the same with mild depression, better than 2 years ago.  Update 05/03/18: She feels her MS has been stable.  She denies any exacerbations.  She is on Tysabri with her last infusion 04/27/2018 generally when she takes Tysabri, she feels very tired that day but then better the rest of the month.   She has some fluctuations in symptoms.   She is walking ok and uses a cane for longer distance.    Balance is off.    No recent falls.    She has pain that is most severe in the left leg and hip.    She is on Percocet with benefit and is not escalating.     She has anxiety helped by clonazepam (0.5 mg tid)  Her appetite is ok but she still has not gained any weight recently.   Megace has not helped much.   Update 01/01/2018: Her MS has been doing about the same.  Specifically, she has not had  any exacerbations.  She is on Tysabri and she gets her infusions in [place].  Her JCV antibodies have been negative.  She still has some difficulties with her gait being mildly wide or balance at times.  She notes no major problems with focal weakness (does feel generalized weak).   She does have some difficulties with dysesthetic pain.  Cymbalta helped some when we added that at the last visit.    She is reporting more pain in her left lower back/buttock and down the left leg.    Her fatigue has been worse.    She gets some benefit from Provigil but the benefit seems less than it was in the past and wears off by early afternoon.  She has gained a few pounds.   Appetite is still poor.     Update 11/23/2017: Her MS has been stable with no new exacerbations.   She is on Tysabri and she tolerates it well.    She does her infusions in [place], last one yesterday.    Hr JCV ws negative at 0.27 July 2017.   Neurologically she is doing well.  Her gait is actually better than it was a couple years ago and she no longer needs to use a cane.  Her  stride is good though she still feels a little off balance at times.  She denies any significant weakness or numbness in the limbs.  She has some urinary urgency with occasional incontinence.  Vision is fine.  Her fatigue has done as well on Provigil as I did on Adderall.  She continues to have difficulty with her weight and weights only 82 pounds.   She eats and wish she would gain weight.  She is off the Adderall.     She repots a lot of pain, especially in the left hip and buttock.   Sometimes the right hip hurts more when she puts weight on it.     Currently, she is on oxycodone 5 mg p.o. 3 times daily.  Update 07/26/2017: She is doing her monthly Tysabri in [place]. She tolerates the infusions well. She has done much better on Tysabri than on Copaxone. She is JCV antibody negative. The last test was 10/17/2016 and was negative and 0.17. Imbalance is unchanged.    She has had a couple falls with tripping but is no worse..  . She is able to walk without a cane but will sometimes use a cane or walker if she feels weaker or more tired. He has painful dysesthesias in her legs.    She has mildly reduced vision out of the right eye. She has urinary frequency and urgency. This is fairly stable and she has rare night time incontinence.     He continues to report fatigue.  She has been prescribed Adderall but does not take on a regular basis. Her weight is a little lower than the last visit at only 86 pounds. Megace had not helped her weight gain. Sleep is variable and she still has some trouble falling asleep and staying asleep. She notes some depression but feels better currently than much of last year. She notes some decreased focus and attention, also helped by Adderall when she takes it.  She has bilateral sciatica with pain in the buttocks radiating to the legs. Trigger point injections have helped (piriformis).   Lumbar MRI was ok.   Lamotrigine helps some.     Hydrocodone helps the pain.      From  02/28/2017: She  went to [facility] last week after noting more fogginess and having more pain and weakness in her legs, especially the right leg.   She fell down in the seat and couch having trouble getting up.   She was more emotional and foggy headed.   She went to [facility] ED and had MRI's.  The brain looks the same.     She has multiple thoracic spine lesions and one at C6C7 looked expansile but did not enhance (unclear if subacute or not).   She had several days of IV Steroids.    She is now feeling back to her baseline. I personally reviewed the MRI of the brain and MRI of the thoracic spine performed during her recent hospitalization. The MRI of the brain shows many T2/FLAIR hyperintense foci in the periventricular, juxtacortical and deep white matter. Her to be any change when compared to her previous MRI. The MRI of the thoracic spine shows multiple T2 hyperintense lesions. The T6-T7 lesion is fairly large and there could be slight expansion of the spinal cord. However, there is no enhancement.      MS:   She continues to do monthly Tysabri (in [place]) since 2016.   She is tolerating the infusions well.   This is her first possible exacerbations since starting Tysabri. She has done much better on Tysabri that she did on Copaxone.  Gait/strength/sensation/pain:   Currently her gait is back to baseline. She is able to walk without a cane most of the time. However, when pain accepts uses cane or walker. Mild weakness in her legs. There are some painful dysesthesias in the legs.  Vision:      She notes no change in the right blurry vision.    She denies any diplopia.  Sciatica/pain:  She continues to experience sciatic type pain in the back and left leg. In the past, piriformis injections have helped her. Pain is not too bad since she received several days of steroids to the hospital.  In the past, she received the best benefit with Botox but her co-pay was too high.  Other med's including  gabapentin, Lyrica and Tegretol are not well tolerated.  Lamictal helps the pain some.      Lumbar MRI was normal.      Bladder:  She notes stable urinary frequency and urgency and has nocturnal incontinence once a month.  She has 1 - 2 times nocturia and sometimes has nocturnal incontinence.   In the past she was on oxybutynin with benefit.  She denies hesitrancy.   Fatigue/sleep:   She has fatigue is physical more than mental. Adderall has helped her fatigue quite a bit. .  Even though she has weight loss, she feels appetite is not suppressed.    Sometimes, she will take only one Adderall pill and occasionally does not take any..       She feels she sleeps much better now than last year and RLS/PLMS is better.    Mood/cognition:   Depression and anxiety are doing a little bit better this visit than last visit..  She notes stress with family and the holidays.   She has had some panic attacks and clonazepam helps the most.   She notes mild cognitive issues with poor focus and attention and reduced verbal fluency.    She is on Pristiq and lamotrigine with benefit  MS History:  She had right optic neuritis in December 2012 and had an MRI of the brain showing optic nerve  inflammation and many white matter spots.    She saw Dr. Macon Large in [place].   She was started on Copaxone.  She likely had an exacerbation in 2013 when she had trouble walking x 2 months.   She received a few days of IV Solu-Medrol.  Last year, she also received a few days of IV Solu-Medrol but she is uncertain   She felt she did well for the first 2 years but switched to Gilenya because she was tired of the shots last year.   At first, she tolerated it well but then her depression got much worse and she had some stomach issues so she returned to Copaxone last month (20 mg daily).   MRI early 2016 showed multiple old MS plaques and 1  Enhancing focus in the right frontal lobe prompting change to Tysabi.     REVIEW OF  SYSTEMS: Constitutional: No fevers, chills, sweats, or change in appetite.  Notes a lot of fatigue Eyes: No visual changes, double vision, eye pain Ear, nose and throat: No hearing loss, ear pain, nasal congestion, sore throat Cardiovascular: No chest pain, palpitations Respiratory: No shortness of breath at rest or with exertion.   No wheezes GastrointestinaI: No nausea, vomiting, diarrhea, abdominal pain, fecal incontinence Genitourinary: as above. Musculoskeletal: No neck pain, back pain Integumentary: No rash, pruritus, skin lesions Neurological: as above Psychiatric: Notes some depression and anxiety Endocrine: No palpitations, diaphoresis, change in appetite, change in weigh or increased thirst Hematologic/Lymphatic: No anemia, purpura, petechiae. Allergic/Immunologic: No itchy/runny eyes, nasal congestion, recent allergic reactions, rashes  ALLERGIES: Allergies  Allergen Reactions   Amoxicillin Anaphylaxis   Penicillins Anaphylaxis   Sulfa Antibiotics Other (See Comments)    unknown   Codeine Nausea And Vomiting    HOME MEDICATIONS:  Current Outpatient Medications:    clonazePAM (KLONOPIN) 0.5 MG tablet, TAKE 1 TABLET BY MOUTH THREE TIMES DAILY AS NEEDED, Disp: 60 tablet, Rfl: 5   cyclobenzaprine (FLEXERIL) 5 MG tablet, TAKE 1 TABLET(5 MG) BY MOUTH EVERY 8 HOURS AS NEEDED FOR MUSCLE SPASMS, Disp: 90 tablet, Rfl: 5   DULoxetine (CYMBALTA) 60 MG capsule, TAKE 1 CAPSULE(60 MG) BY MOUTH DAILY, Disp: 90 capsule, Rfl: 3   feeding supplement, ENSURE ENLIVE, (ENSURE ENLIVE) LIQD, Take 237 mLs by mouth 2 (two) times daily between meals., Disp: 237 mL, Rfl: 12   lamoTRIgine (LAMICTAL) 100 MG tablet, TAKE 1 TABLET(100 MG) BY MOUTH TWICE DAILY, Disp: 180 tablet, Rfl: 3   megestrol (MEGACE) 40 MG tablet, Take 1 tablet (40 mg total) by mouth daily., Disp: 30 tablet, Rfl: 5   modafinil (PROVIGIL) 200 MG tablet, TAKE 1 TABLET(200 MG) BY MOUTH DAILY, Disp: 30 tablet, Rfl: 5    natalizumab (TYSABRI) 300 MG/15ML injection, Inject 15 mLs (300 mg total) into the vein every 30 (thirty) days., Disp: 15 mL, Rfl: 11   oxybutynin (DITROPAN) 5 MG tablet, Take 1 tablet (5 mg total) by mouth 2 (two) times daily as needed for bladder spasms., Disp: 60 tablet, Rfl: 11   oxyCODONE-acetaminophen (PERCOCET/ROXICET) 5-325 MG tablet, One po qid prn, Disp: 120 tablet, Rfl: 0   ranitidine (ZANTAC) 150 MG tablet, Take 150 mg by mouth 2 (two) times daily., Disp: , Rfl:   PAST MEDICAL HISTORY: Past Medical History:  Diagnosis Date   Chronic pain syndrome    Depression    Fibromyalgia    GAD (generalized anxiety disorder)    Headache    Multiple sclerosis (HCC) 10/28/2014   Obsessive compulsive disorder  Vision abnormalities     PAST SURGICAL HISTORY: Past Surgical History:  Procedure Laterality Date   NO PAST SURGERIES      FAMILY HISTORY: Family History  Problem Relation Age of Onset   Healthy Mother    Diabetes type II Father    Prostate cancer Father     SOCIAL HISTORY:  Social History   Socioeconomic History   Marital status: Married    Spouse name: Not on file   Number of children: Not on file   Years of education: Not on file   Highest education level: Not on file  Occupational History   Not on file  Social Needs   Financial resource strain: Not on file   Food insecurity    Worry: Not on file    Inability: Not on file   Transportation needs    Medical: Not on file    Non-medical: Not on file  Tobacco Use   Smoking status: Current Every Day Smoker    Packs/day: 0.50    Types: Cigarettes   Smokeless tobacco: Never Used  Substance and Sexual Activity   Alcohol use: No    Alcohol/week: 0.0 standard drinks   Drug use: No   Sexual activity: Not on file  Lifestyle   Physical activity    Days per week: Not on file    Minutes per session: Not on file   Stress: Not on file  Relationships   Social connections     Talks on phone: Not on file    Gets together: Not on file    Attends religious service: Not on file    Active member of club or organization: Not on file    Attends meetings of clubs or organizations: Not on file    Relationship status: Not on file   Intimate partner violence    Fear of current or ex partner: Not on file    Emotionally abused: Not on file    Physically abused: Not on file    Forced sexual activity: Not on file  Other Topics Concern   Not on file  Social History Narrative   Not on file     PHYSICAL EXAM  Vitals:   05/21/19 0841  BP: 102/80  Pulse: 77  Temp: (!) 97.5 F (36.4 C)  SpO2: 96%  Weight: 87 lb (39.5 kg)  Height: 5\' 4"  (1.626 m)    Body mass index is 14.93 kg/m.   General: The patient is well-developed and well-nourished and in no acute distress  Musculoskeletal:   Today, she has less tenderness over the piriformis muscles, left greater than right, compared to her previous visit.  Neurologic Exam  Mental status:  The patient is alert and oriented x 3 at the time of the examination. The patient has apparent normal recent and remote memory, with a mildly reduced attention span and concentration ability.     Cranial nerves: Extraocular movements are full.  Facial strength and sensation was normal.  Trapezius strength was normal.  Hearing was normal and symmetric.  Motor:  Muscle bulk is normal.   Muscle tone is mildly increased in legs. Strength is 5/5.   Sensory: She has normal sensation in her arms.  She has slightly reduced vibration sensation in the toes.  Coordination: Cerebellar testing reveals good finger-nose-finger bilaterally.  She has mildly reduced heel-to-shin bilaterally.  Gait and station: Station is normal.  Gait is mildly wide and tandem gait is moderately wide.. Romberg is negative.  Reflexes: Deep tendon  reflexes are normal in the arms increased bilaterally with nonsustained clonus at the ankles      ASSESSMENT AND  PLAN  Multiple sclerosis (HCC) - Plan: Stratify JCV Antibody Test ([facility]), CBC with Differential/Platelets  Low vitamin D level - Plan: Vitamin D, 25-hydroxy  Vitamin D deficiency, unspecified  - Plan: Vitamin D, 25-hydroxy  Depression, unspecified depression type  Dysesthesia  Gait disorder  Other fatigue  Muscle spasticity  Other chronic pain   1.  Continue Tysabri 300 mg every 4 weeks.  We will check JCV antibody and CBC with differential today.  She remains JCV antibody negative or low positive, she will continue Tysabri infusions in [place].  If she converts to middle or high positive consider a different medication. 2.  Continue clonazepam for anxiety and spasms, oxycodone for pain and modafinil for fatigue and sleepiness.  Continue lamotrigine 100 mg twice a day for dysesthesias.  3.  She will continue Megace for weight gain.  She has gained 9 pounds since her last visit.  She is still underweight.  4.   rtc 5 months, or sooner if new or worsening neurologic symptoms  Celestial Barnfield A. Epimenio Foot, MD, PhD 05/21/2019, 6:39 PM Certified in Neurology, Clinical Neurophysiology, Sleep Medicine, Pain Medicine and Neuroimaging  [facility] [address] [place], [place] [address] [phone]

## 2019-05-21 NOTE — Telephone Encounter (Signed)
Received fax notification from touch prescribing program that pt re-authorized for Tysabri from 05/20/2019-12/19/2019. Pt enrollment number: [id number]. Account: Georganna Skeans. Site auth #: [id number].

## 2019-05-21 NOTE — Telephone Encounter (Signed)
Placed JCV lab in [facility] lock box for routine lab pick up. Results pending. 

## 2019-05-22 LAB — CBC WITH DIFFERENTIAL/PLATELET
Basophils Absolute: 0.1 10*3/uL (ref 0.0–0.2)
Basos: 1 %
EOS (ABSOLUTE): 0.2 10*3/uL (ref 0.0–0.4)
Eos: 2 %
Hematocrit: 45.5 % (ref 34.0–46.6)
Hemoglobin: 14.9 g/dL (ref 11.1–15.9)
Immature Grans (Abs): 0.1 10*3/uL (ref 0.0–0.1)
Immature Granulocytes: 1 %
Lymphocytes Absolute: 3.1 10*3/uL (ref 0.7–3.1)
Lymphs: 31 %
MCH: 31.3 pg (ref 26.6–33.0)
MCHC: 32.7 g/dL (ref 31.5–35.7)
MCV: 96 fL (ref 79–97)
Monocytes Absolute: 0.7 10*3/uL (ref 0.1–0.9)
Monocytes: 7 %
Neutrophils Absolute: 5.7 10*3/uL (ref 1.4–7.0)
Neutrophils: 58 %
Platelets: 274 10*3/uL (ref 150–450)
RBC: 4.76 x10E6/uL (ref 3.77–5.28)
RDW: 13.7 % (ref 11.7–15.4)
WBC: 9.8 10*3/uL (ref 3.4–10.8)

## 2019-05-22 LAB — VITAMIN D 25 HYDROXY (VIT D DEFICIENCY, FRACTURES): Vit D, 25-Hydroxy: 21 ng/mL — ABNORMAL LOW (ref 30.0–100.0)

## 2019-05-23 ENCOUNTER — Telehealth: Payer: Self-pay | Admitting: *Deleted

## 2019-05-23 DIAGNOSIS — R7989 Other specified abnormal findings of blood chemistry: Secondary | ICD-10-CM

## 2019-05-23 MED ORDER — VITAMIN D (ERGOCALCIFEROL) 1.25 MG (50000 UNIT) PO CAPS: ORAL_CAPSULE | ORAL | 1 refills | Status: DC

## 2019-05-23 NOTE — Telephone Encounter (Signed)
-----   Message from Britt Bottom, MD sent at 05/22/2019  3:57 PM EST ----- Vitamin D was low.  Please send in a prescription for 50,000 units #13 with 1 refill 1 p.o. weekly.  She should take 5000 units daily OTC when these are done  Other lab work was fine.

## 2019-05-23 NOTE — Telephone Encounter (Signed)
Called pt. Relayed lab results per Dr. Felecia Shelling note. She verbalized understanding. I called rx to pharmacy for her. Asked her to call back if she has any issues picking up from pharmacy.

## 2019-05-28 ENCOUNTER — Telehealth: Payer: Self-pay | Admitting: Neurology

## 2019-05-28 ENCOUNTER — Other Ambulatory Visit: Payer: Self-pay | Admitting: Neurology

## 2019-05-28 MED ORDER — OXYCODONE-ACETAMINOPHEN 5-325 MG PO TABS: ORAL_TABLET | ORAL | 0 refills | Status: DC

## 2019-05-28 NOTE — Telephone Encounter (Signed)
Sharyn Lull called to verify the status of the patient. States the patient has not had an infusion since september marking her inactive. Please follow up

## 2019-05-28 NOTE — Addendum Note (Signed)
Addended by: Verlin Grills T on: 05/28/2019 03:14 PM   Modules accepted: Orders

## 2019-05-28 NOTE — Telephone Encounter (Signed)
I reached out to the pt and advised I needed to confirm when her last infusion was. Pt reports her last infusion was sometime back in oct. I advised MS touch states the infusion was back in sept.  Pt states she will confirm with [facility] tomorrow.  Pt reports she is schedule for this tomorrow at 900.  I contacted [employer] and notified rep Jake on this update he will let the case manager know and change her status back to active.

## 2019-05-28 NOTE — Telephone Encounter (Signed)
[place] drug [place] has been [place]. [place]st refill was 05/03/2019 # 120 for a 30 day supply. Refill is not due until 06/02/2019. Will send to MD with note to pharm not to fill until 11/22.

## 2019-05-28 NOTE — Telephone Encounter (Signed)
Pt has called for a refill on oxyCODONE-acetaminophen (PERCOCET/ROXICET) 5-325 MG tablet  [facility] [phone]

## 2019-06-03 NOTE — Telephone Encounter (Signed)
JCV index value was 0.25 and JCV antibody was indeterminate. Paper fax given to MD to review.

## 2019-06-10 ENCOUNTER — Telehealth: Payer: Self-pay | Admitting: Neurology

## 2019-06-10 NOTE — Telephone Encounter (Signed)
Dr. Sater- what would you like to do?  

## 2019-06-10 NOTE — Telephone Encounter (Signed)
Okay.  Please give her a call and remind her that she needs to be compliant and it is not good it more than 8 weeks goes in between doses of Tysabri as she risks relapses

## 2019-06-10 NOTE — Telephone Encounter (Signed)
Sabrina [name] from [facility] in [place], [place] called wanting to inform the provider that the pt is not being compliant with her Tysabri infusions and the last time she was there was in Sept.

## 2019-06-10 NOTE — Telephone Encounter (Signed)
I called pt. Expressed importance of being compliant with Tysabri infusions. Relayed Dr. Garth Bigness message. She states she is r/s for infusion tomorrow. She will call back if her appt changes tomorrow.

## 2019-06-11 NOTE — Telephone Encounter (Signed)
Dr. Sater- FYI 

## 2019-06-11 NOTE — Telephone Encounter (Signed)
FYI-Pt.'s husband called and stated that the pt made a mistake stating her appt was today but is in fact tomorrow.

## 2019-07-01 ENCOUNTER — Other Ambulatory Visit: Payer: Self-pay | Admitting: Neurology

## 2019-07-01 MED ORDER — OXYCODONE-ACETAMINOPHEN 5-325 MG PO TABS: ORAL_TABLET | ORAL | 0 refills | Status: DC

## 2019-07-01 NOTE — Telephone Encounter (Signed)
Pt is requesting a refill of oxyCODONE-acetaminophen (PERCOCET/ROXICET) 5-325 MG tablet , to be sent to [id number] - [place], [place] - [address]

## 2019-07-29 ENCOUNTER — Other Ambulatory Visit: Payer: Self-pay | Admitting: Neurology

## 2019-07-30 ENCOUNTER — Other Ambulatory Visit: Payer: Self-pay | Admitting: Neurology

## 2019-07-30 MED ORDER — OXYCODONE-ACETAMINOPHEN 5-325 MG PO TABS: ORAL_TABLET | ORAL | 0 refills | Status: DC

## 2019-07-30 NOTE — Telephone Encounter (Signed)
Pt is requesting a refill of oxyCODONE-acetaminophen (PERCOCET/ROXICET) 5-325 MG tablet , to be sent to [id number] - [place], [place] - [address] AT [facility] [address]

## 2019-07-31 ENCOUNTER — Ambulatory Visit: Payer: Medicare Other | Admitting: Neurology

## 2019-08-19 ENCOUNTER — Telehealth: Payer: Self-pay

## 2019-08-19 NOTE — Telephone Encounter (Signed)
Patient called wanting to know if she is advised to get the COVID vaccine . Please follow up.

## 2019-08-21 DIAGNOSIS — Z882 Allergy status to sulfonamides status: Secondary | ICD-10-CM | POA: Diagnosis not present

## 2019-08-21 DIAGNOSIS — G35 Multiple sclerosis: Secondary | ICD-10-CM | POA: Diagnosis not present

## 2019-08-21 DIAGNOSIS — Z880 Allergy status to penicillin: Secondary | ICD-10-CM | POA: Diagnosis not present

## 2019-08-21 DIAGNOSIS — Z885 Allergy status to narcotic agent status: Secondary | ICD-10-CM | POA: Diagnosis not present

## 2019-08-30 ENCOUNTER — Other Ambulatory Visit: Payer: Self-pay | Admitting: Neurology

## 2019-08-30 NOTE — Telephone Encounter (Signed)
Pt is needing a refill on her oxyCODONE-acetaminophen (PERCOCET/ROXICET) 5-325 MG tablet sent in to the [facility] in [place]

## 2019-09-02 MED ORDER — OXYCODONE-ACETAMINOPHEN 5-325 MG PO TABS: ORAL_TABLET | ORAL | 0 refills | Status: DC

## 2019-09-10 NOTE — Telephone Encounter (Signed)
I called pt. Relayed Dr. Bonnita Hollow message. She verbalized understanding. I recommended she try not to get right before Tysabri infusion in case she does not tolerate vaccine.

## 2019-09-10 NOTE — Telephone Encounter (Signed)
Pt has called to inquire re: when she should get her vaccine as a result of her being on Tysabri.  Pt is also asking which of the vaccines would Dr Epimenio Foot recommend she take

## 2019-09-10 NOTE — Telephone Encounter (Signed)
She can have any of the 3 vaccines which are available

## 2019-09-17 ENCOUNTER — Telehealth: Payer: Self-pay | Admitting: Neurology

## 2019-09-17 ENCOUNTER — Other Ambulatory Visit: Payer: Self-pay | Admitting: Neurology

## 2019-09-17 MED ORDER — ARMODAFINIL 200 MG PO TABS: ORAL_TABLET | ORAL | 5 refills | Status: DC

## 2019-09-17 NOTE — Telephone Encounter (Signed)
I sent in a prescriptopn for armodafinil

## 2019-09-17 NOTE — Telephone Encounter (Signed)
1) Medication(s) Requested (by name): modafinil (PROVIGIL) 200 MG tablet   2) Pharmacy of Choice: [id number] - [place], [place] - [address] AT [facility] [place] [address]  [address], [place]

## 2019-09-17 NOTE — Telephone Encounter (Signed)
Called pt to let her know modafinil not covered by her plan. Dr. Epimenio Foot sent in armodafinil instead for her. She verbalized understanding and appreciation.

## 2019-09-18 ENCOUNTER — Telehealth: Payer: Self-pay | Admitting: Neurology

## 2019-09-18 MED ORDER — MODAFINIL 200 MG PO TABS: ORAL_TABLET | ORAL | 5 refills | Status: DC

## 2019-09-18 NOTE — Addendum Note (Signed)
Addended by: Asa Lente on: 09/18/2019 06:03 PM   Modules accepted: Orders

## 2019-09-18 NOTE — Addendum Note (Signed)
Addended by: Arther Abbott on: 09/18/2019 03:02 PM   Modules accepted: Orders

## 2019-09-18 NOTE — Telephone Encounter (Addendum)
Received fax from [facility] that PA armodafinil denied. Only covered for dx of narcolepsy, shift work disorder, and OSA.   I called pt. She is going to use goodrx coupon and fill at [place], [place].Included goodrx info on rx. Pt will call back if she has any issues picking up rx.   I called [facility] and [facility]VM asking that rx armodafinil and any rx on file for modafinil. Advised we are sending rx modafinil to different pharmacy.

## 2019-09-18 NOTE — Telephone Encounter (Signed)
PA submitted on CMM. Key: [id number]. PA Case ID: [id number] - Rx #: [id number]. Waiting on determination from [facility].

## 2019-09-18 NOTE — Telephone Encounter (Signed)
Noted, we will work on this 

## 2019-09-18 NOTE — Telephone Encounter (Signed)
Pt called stating that she is needing a PA for her Armodafinil 200 MG TABS Please advise.

## 2019-09-19 DIAGNOSIS — G35 Multiple sclerosis: Secondary | ICD-10-CM | POA: Diagnosis not present

## 2019-09-30 ENCOUNTER — Other Ambulatory Visit: Payer: Self-pay | Admitting: Neurology

## 2019-09-30 MED ORDER — OXYCODONE-ACETAMINOPHEN 5-325 MG PO TABS: ORAL_TABLET | ORAL | 0 refills | Status: DC

## 2019-09-30 NOTE — Telephone Encounter (Signed)
Pt is needing a refill on her oxyCODONE-acetaminophen (PERCOCET/ROXICET) 5-325 MG tablet sent in to the [facility] on [address] In [place], [place]

## 2019-10-21 ENCOUNTER — Ambulatory Visit: Payer: Medicare Other | Admitting: Neurology

## 2019-10-29 ENCOUNTER — Other Ambulatory Visit: Payer: Self-pay | Admitting: Neurology

## 2019-10-29 MED ORDER — OXYCODONE-ACETAMINOPHEN 5-325 MG PO TABS: ORAL_TABLET | ORAL | 0 refills | Status: DC

## 2019-10-29 NOTE — Telephone Encounter (Signed)
Pt is needing a refill on her oxyCODONE-acetaminophen (PERCOCET/ROXICET) 5-325 MG tablet sent in to the [facility] in [place]

## 2019-11-07 DIAGNOSIS — G35 Multiple sclerosis: Secondary | ICD-10-CM | POA: Diagnosis not present

## 2019-11-18 ENCOUNTER — Telehealth: Payer: Self-pay | Admitting: *Deleted

## 2019-11-18 NOTE — Telephone Encounter (Signed)
Faxed completed/signed Tysabri pt status report and reauth questionnaire to [facility] at [phone]. Received confirmation.   Received fax notification that pt is re-authorized for Tysabri from 11/18/19-06/19/20. Patient enrollment number: [id number]. Account: [facility]. Site auth number: [id number]. [address] [place], [place] [address]. Phone: [phone]. Fax: [phone].

## 2019-11-28 ENCOUNTER — Telehealth: Payer: Self-pay | Admitting: Neurology

## 2019-11-28 ENCOUNTER — Other Ambulatory Visit: Payer: Self-pay

## 2019-11-28 ENCOUNTER — Ambulatory Visit (INDEPENDENT_AMBULATORY_CARE_PROVIDER_SITE_OTHER): Payer: Medicare Other | Admitting: Neurology

## 2019-11-28 ENCOUNTER — Telehealth: Payer: Self-pay | Admitting: *Deleted

## 2019-11-28 ENCOUNTER — Encounter: Payer: Self-pay | Admitting: Neurology

## 2019-11-28 VITALS — BP 140/90 | HR 87 | Ht 64.0 in | Wt 88.0 lb

## 2019-11-28 DIAGNOSIS — R269 Unspecified abnormalities of gait and mobility: Secondary | ICD-10-CM | POA: Diagnosis not present

## 2019-11-28 DIAGNOSIS — M5432 Sciatica, left side: Secondary | ICD-10-CM

## 2019-11-28 DIAGNOSIS — Z79899 Other long term (current) drug therapy: Secondary | ICD-10-CM | POA: Diagnosis not present

## 2019-11-28 DIAGNOSIS — R634 Abnormal weight loss: Secondary | ICD-10-CM | POA: Diagnosis not present

## 2019-11-28 DIAGNOSIS — G8929 Other chronic pain: Secondary | ICD-10-CM | POA: Diagnosis not present

## 2019-11-28 DIAGNOSIS — G35 Multiple sclerosis: Secondary | ICD-10-CM

## 2019-11-28 MED ORDER — OXYCODONE-ACETAMINOPHEN 5-325 MG PO TABS: ORAL_TABLET | ORAL | 0 refills | Status: DC

## 2019-11-28 NOTE — Telephone Encounter (Signed)
Medicare/medicaid order sent to [facility]. No auth they will reach out to the patient to schedule.  °

## 2019-11-28 NOTE — Progress Notes (Signed)
u  [facility]  PATIENT: Sabrina [name] DOB: 06-12-1981    _________________________________   HISTORICAL  CHIEF COMPLAINT:  Chief Complaint  Patient presents with  . Follow-up    RM 12, alone. Last seen 05/21/2019. She is having increased pain. She received Moderna Covid-19 vaccine.   . Multiple Sclerosis    On Tysabri. Last infusion: 11/07/2019. Next infusion: 12/05/19. Receives at [facility]. Last JCV ab checked 05/21/2019 indeterminate, index: 0.25. States infusions are going well. Her last infusion went well.     HISTORY OF PRESENT ILLNESS:  Sabrina [name] is a 39 y.o. woman with multiple sclerosis and chronic pain.     Update 11/28/2019: She is on Tysabri for RRMS.   She tolerates it well.   No exacerbation.    She is getting dental implants.  She has been in more pain, not only with the dental work but in her buttocks and legs.    She is on Percocet 5 mg qid but feels it is not helping enough.   She is also on lamotrigine, cyclobenzaprine (takes less due to sleepiness), clonazepam 0.5 once a day and occassionally at night.   Injections (piriformis) did not help.  The PDMP was reviewed and she is not getting med's from other sources and does not have drug seeking behavior.   She  She has gained a few pounds (88 pounds)   She stopped Megace as appetite improved some anyway.      She is walking about the same with reduced balance.   She needs to watch her step.    She reports urinary urgency and also has some incomplete emptying. .  She just occasionally takes ditropan.   She has fatigue and somnolence, helped by modafinil     She got her Covid-19 vaccination and tolerated it well.  Update 05/21/19 She is on Tysabri.    She tolerates it well (infusions in [place]).  Her last JCV Ab was negative at 0.14.   She had one episode x3 weeks where her left leg seemed weaker.  Her left leg is better again but she still feels it is weaker than her right leg.       She has fatigue and is helped by Modafinil.   She is sleeping well at night and takes Flexeril some nights.  .   She has anxiety, helped by clonazepam every morning.      She has left hip and leg pain.     She has had piriformis syndrome on the left in the past.     She has gained weight from 78 pounds to 87 pounds (weight was 120-130 before MS).   She takes Ensure daily   She sometimes takes Megace.  Vision is mildly worse over the past year (she has astigmatism).   Colors are less bright OS.    Reading is worse in the distance.       Update 09/13/2018: She is on Tysabri.   She feels her MS has been fairly stable.  No exacerbations.    She is JCV Ab negative (last one 0.17).    She does her infusions in [place].  Her gait is about the same with reduced endurance and occasional foot drop.   She often uses a cane.  Her back pain is mostly in the buttocks, hip and legs all the way to the foot.  Left leg and hip are worse than the right.     Balance is poor.  Vision is ok.    She notes urinary hesitancy.  She has occasional nocturnal incontinence.    Oxycodone and Flexeril helps the pain some.   Lamotrigine helps the dysesthesia some.   She has fatigue, helped by modafinil.    She has gained a couple pounds but is not sure if any benefit from Megace.   Mood is about the same with mild depression, better than 2 years ago.  Update 05/03/18: She feels her MS has been stable.  She denies any exacerbations.  She is on Tysabri with her last infusion 04/27/2018 generally when she takes Tysabri, she feels very tired that day but then better the rest of the month.   She has some fluctuations in symptoms.   She is walking ok and uses a cane for longer distance.    Balance is off.    No recent falls.    She has pain that is most severe in the left leg and hip.    She is on Percocet with benefit and is not escalating.     She has anxiety helped by clonazepam (0.5 mg tid)  Her appetite is ok but she  still has not gained any weight recently.   Megace has not helped much.   Update 01/01/2018: Her MS has been doing about the same.  Specifically, she has not had any exacerbations.  She is on Tysabri and she gets her infusions in [place].  Her JCV antibodies have been negative.  She still has some difficulties with her gait being mildly wide or balance at times.  She notes no major problems with focal weakness (does feel generalized weak).   She does have some difficulties with dysesthetic pain.  Cymbalta helped some when we added that at the last visit.    She is reporting more pain in her left lower back/buttock and down the left leg.    Her fatigue has been worse.    She gets some benefit from Provigil but the benefit seems less than it was in the past and wears off by early afternoon.  She has gained a few pounds.   Appetite is still poor.     Update 11/23/2017: Her MS has been stable with no new exacerbations.   She is on Tysabri and she tolerates it well.    She does her infusions in [place], last one yesterday.    Hr JCV ws negative at 0.27 July 2017.   Neurologically she is doing well.  Her gait is actually better than it was a couple years ago and she no longer needs to use a cane.  Her stride is good though she still feels a little off balance at times.  She denies any significant weakness or numbness in the limbs.  She has some urinary urgency with occasional incontinence.  Vision is fine.  Her fatigue has done as well on Provigil as I did on Adderall.  She continues to have difficulty with her weight and weights only 82 pounds.   She eats and wish she would gain weight.  She is off the Adderall.     She repots a lot of pain, especially in the left hip and buttock.   Sometimes the right hip hurts more when she puts weight on it.     Currently, she is on oxycodone 5 mg p.o. 3 times daily.  Update 07/26/2017: She is doing her monthly Tysabri in [place]. She tolerates the infusions  well. She has done much better on  Tysabri than on Copaxone. She is JCV antibody negative. The last test was 10/17/2016 and was negative and 0.17. Imbalance is unchanged.    She has had a couple falls with tripping but is no worse..  . She is able to walk without a cane but will sometimes use a cane or walker if she feels weaker or more tired. He has painful dysesthesias in her legs.    She has mildly reduced vision out of the right eye. She has urinary frequency and urgency. This is fairly stable and she has rare night time incontinence.     He continues to report fatigue.  She has been prescribed Adderall but does not take on a regular basis. Her weight is a little lower than the last visit at only 86 pounds. Megace had not helped her weight gain. Sleep is variable and she still has some trouble falling asleep and staying asleep. She notes some depression but feels better currently than much of last year. She notes some decreased focus and attention, also helped by Adderall when she takes it.  She has bilateral sciatica with pain in the buttocks radiating to the legs. Trigger point injections have helped (piriformis).   Lumbar MRI was ok.   Lamotrigine helps some.     Hydrocodone helps the pain.      From 02/28/2017: She went to [facility] last week after noting more fogginess and having more pain and weakness in her legs, especially the right leg.   She fell down in the seat and couch having trouble getting up.   She was more emotional and foggy headed.   She went to [facility] ED and had MRI's.  The brain looks the same.     She has multiple thoracic spine lesions and one at C6C7 looked expansile but did not enhance (unclear if subacute or not).   She had several days of IV Steroids.    She is now feeling back to her baseline. I personally reviewed the MRI of the brain and MRI of the thoracic spine performed during her recent hospitalization. The MRI of the brain shows many T2/FLAIR hyperintense foci in  the periventricular, juxtacortical and deep white matter. Her to be any change when compared to her previous MRI. The MRI of the thoracic spine shows multiple T2 hyperintense lesions. The T6-T7 lesion is fairly large and there could be slight expansion of the spinal cord. However, there is no enhancement.      MS:   She continues to do monthly Tysabri (in [place]) since 2016.   She is tolerating the infusions well.   This is her first possible exacerbations since starting Tysabri. She has done much better on Tysabri that she did on Copaxone.  Gait/strength/sensation/pain:   Currently her gait is back to baseline. She is able to walk without a cane most of the time. However, when pain accepts uses cane or walker. Mild weakness in her legs. There are some painful dysesthesias in the legs.  Vision:      She notes no change in the right blurry vision.    She denies any diplopia.  Sciatica/pain:  She continues to experience sciatic type pain in the back and left leg. In the past, piriformis injections have helped her. Pain is not too bad since she received several days of steroids to the hospital.  In the past, she received the best benefit with Botox but her co-pay was too high.  Other med's including gabapentin, Lyrica and  Tegretol are not well tolerated.  Lamictal helps the pain some.      Lumbar MRI was normal.      Bladder:  She notes stable urinary frequency and urgency and has nocturnal incontinence once a month.  She has 1 - 2 times nocturia and sometimes has nocturnal incontinence.   In the past she was on oxybutynin with benefit.  She denies hesitrancy.   Fatigue/sleep:   She has fatigue is physical more than mental. Adderall has helped her fatigue quite a bit. .  Even though she has weight loss, she feels appetite is not suppressed.    Sometimes, she will take only one Adderall pill and occasionally does not take any..       She feels she sleeps much better now than last year and RLS/PLMS is  better.    Mood/cognition:   Depression and anxiety are doing a little bit better this visit than last visit..  She notes stress with family and the holidays.   She has had some panic attacks and clonazepam helps the most.   She notes mild cognitive issues with poor focus and attention and reduced verbal fluency.    She is on Pristiq and lamotrigine with benefit  MS History:  She had right optic neuritis in December 2012 and had an MRI of the brain showing optic nerve inflammation and many white matter spots.    She saw Dr. Macon Large in [place].   She was started on Copaxone.  She likely had an exacerbation in 2013 when she had trouble walking x 2 months.   She received a few days of IV Solu-Medrol.  Last year, she also received a few days of IV Solu-Medrol but she is uncertain   She felt she did well for the first 2 years but switched to Gilenya because she was tired of the shots last year.   At first, she tolerated it well but then her depression got much worse and she had some stomach issues so she returned to Copaxone last month (20 mg daily).   MRI early 2016 showed multiple old MS plaques and 1  Enhancing focus in the right frontal lobe prompting change to Tysabi.     REVIEW OF SYSTEMS: Constitutional: No fevers, chills, sweats, or change in appetite.  Notes a lot of fatigue Eyes: No visual changes, double vision, eye pain Ear, nose and throat: No hearing loss, ear pain, nasal congestion, sore throat Cardiovascular: No chest pain, palpitations Respiratory: No shortness of breath at rest or with exertion.   No wheezes GastrointestinaI: No nausea, vomiting, diarrhea, abdominal pain, fecal incontinence Genitourinary: as above. Musculoskeletal: No neck pain, back pain Integumentary: No rash, pruritus, skin lesions Neurological: as above Psychiatric: Notes some depression and anxiety Endocrine: No palpitations, diaphoresis, change in appetite, change in weigh or increased  thirst Hematologic/Lymphatic: No anemia, purpura, petechiae. Allergic/Immunologic: No itchy/runny eyes, nasal congestion, recent allergic reactions, rashes  ALLERGIES: Allergies  Allergen Reactions  . Amoxicillin Anaphylaxis  . Penicillins Anaphylaxis  . Sulfa Antibiotics Other (See Comments)    unknown  . Codeine Nausea And Vomiting    HOME MEDICATIONS:  Current Outpatient Medications:  .  clonazePAM (KLONOPIN) 0.5 MG tablet, TAKE 1 TABLET BY MOUTH THREE TIMES DAILY AS NEEDED (Patient taking differently: 0.5 mg 2 (two) times daily as needed. ), Disp: 60 tablet, Rfl: 5 .  cyclobenzaprine (FLEXERIL) 5 MG tablet, TAKE 1 TABLET(5 MG) BY MOUTH EVERY 8 HOURS AS NEEDED FOR MUSCLE SPASMS, Disp: 90 tablet,  Rfl: 5 .  DULoxetine (CYMBALTA) 60 MG capsule, TAKE 1 CAPSULE(60 MG) BY MOUTH DAILY, Disp: 90 capsule, Rfl: 3 .  feeding supplement, ENSURE ENLIVE, (ENSURE ENLIVE) LIQD, Take 237 mLs by mouth 2 (two) times daily between meals., Disp: 237 mL, Rfl: 12 .  lamoTRIgine (LAMICTAL) 100 MG tablet, TAKE 1 TABLET(100 MG) BY MOUTH TWICE DAILY, Disp: 180 tablet, Rfl: 3 .  megestrol (MEGACE) 40 MG tablet, Take 1 tablet (40 mg total) by mouth daily., Disp: 30 tablet, Rfl: 5 .  modafinil (PROVIGIL) 200 MG tablet, TAKE 1 TABLET(200 MG) BY MOUTH DAILY, Disp: 30 tablet, Rfl: 5 .  natalizumab (TYSABRI) 300 MG/15ML injection, Inject 15 mLs (300 mg total) into the vein every 30 (thirty) days., Disp: 15 mL, Rfl: 11 .  oxybutynin (DITROPAN) 5 MG tablet, Take 1 tablet (5 mg total) by mouth 2 (two) times daily as needed for bladder spasms., Disp: 60 tablet, Rfl: 11 .  oxyCODONE-acetaminophen (PERCOCET/ROXICET) 5-325 MG tablet, One po qid prn, Disp: 120 tablet, Rfl: 0 .  ranitidine (ZANTAC) 150 MG tablet, Take 150 mg by mouth 2 (two) times daily., Disp: , Rfl:  .  Vitamin D, Ergocalciferol, (DRISDOL) 1.25 MG (50000 UT) CAPS capsule, Take 1 capsule by mouth weekly for 26 weeks then go to OTC Vit D 5000U daily, Disp: 13  capsule, Rfl: 1  PAST MEDICAL HISTORY: Past Medical History:  Diagnosis Date  . Chronic pain syndrome   . Depression   . Fibromyalgia   . GAD (generalized anxiety disorder)   . Headache   . Multiple sclerosis ([place]) 10/28/2014  . Obsessive compulsive disorder   . Vision abnormalities     PAST SURGICAL HISTORY: Past Surgical History:  Procedure Laterality Date  . NO PAST SURGERIES      FAMILY HISTORY: Family History  Problem Relation Age of Onset  . Healthy Mother   . Diabetes type II Father   . Prostate cancer Father     SOCIAL HISTORY:  Social History   Socioeconomic History  . Marital status: Married    Spouse name: Not on file  . Number of children: Not on file  . Years of education: Not on file  . Highest education level: Not on file  Occupational History  . Not on file  Tobacco Use  . Smoking status: Current Every Day Smoker    Packs/day: 0.50    Types: Cigarettes  . Smokeless tobacco: Never Used  Substance and Sexual Activity  . Alcohol use: No    Alcohol/week: 0.0 standard drinks  . Drug use: No  . Sexual activity: Not on file  Other Topics Concern  . Not on file  Social History Narrative  . Not on file   Social Determinants of Health   Financial Resource Strain:   . Difficulty of Paying Living Expenses:   Food Insecurity:   . Worried About Charity fundraiser in the Last Year:   . Arboriculturist in the Last Year:   Transportation Needs:   . Film/video editor (Medical):   [place] Lack of Transportation (Non-Medical):   Physical Activity:   . Days of Exercise per Week:   . Minutes of Exercise per Session:   Stress:   . Feeling of Stress :   Social Connections:   . Frequency of Communication with Friends and Family:   . Frequency of Social Gatherings with Friends and Family:   . Attends Religious Services:   . Active Member of Clubs or Organizations:   .  Attends Banker Meetings:   [place] Marital Status:   Intimate Partner  Violence:   . Fear of Current or Ex-Partner:   . Emotionally Abused:   [place] Physically Abused:   . Sexually Abused:      PHYSICAL EXAM  Vitals:   11/28/19 1106  BP: 140/90  Pulse: 87  Weight: 88 lb (39.9 kg)  Height: 5\' 4"  (1.626 m)    Body mass index is 15.11 kg/m.   General: The patient is well-developed and well-nourished and in no acute distress  Musculoskeletal:   Today, she has less tenderness over the piriformis muscles, left greater than right, compared to her previous visit.  Neurologic Exam  Mental status:  The patient is alert and oriented x 3 at the time of the examination. The patient has apparent normal recent and remote memory, with a mildly reduced attention span and concentration ability.     Cranial nerves: Extraocular movements are full.  Facial strength and sensation was normal.  Trapezius strength was normal.  Hearing was normal and symmetric.  Motor:  Muscle bulk is normal.   Muscle tone is mildly increased in legs. Strength is 5/5.   Sensory: She has normal sensation in her arms.  She has slightly reduced vibration sensation in the toes.  Coordination: Cerebellar testing reveals good finger-nose-finger bilaterally.  She has mildly reduced heel-to-shin bilaterally.  Gait and station: Station is normal.  Gait is mildly wide and tandem gait is moderately wide.. Romberg is negative.  Reflexes: Deep tendon reflexes are normal in the arms increased bilaterally with nonsustained clonus at the ankles      ASSESSMENT AND PLAN  Multiple sclerosis (HCC) - Plan: MR BRAIN W WO CONTRAST, Stratify JCV Antibody Test ([facility]), CBC with Differential/Platelet  Left sided sciatica - Plan: Donut Cushion  Abnormal weight loss - Plan: Donut Cushion  High risk medication use - Plan: Stratify JCV Antibody Test ([facility]), CBC with Differential/Platelet  Gait disorder  Other chronic pain   1.  Continue Tysabri 300 mg every 4 weeks.  We will check JCV antibody and  CBC with differential today.  She will continue Tysabri infusions in [place] if she is JCV antibody negative or low positive and we will need to consider a different DMT if she converts to middle or high positive.  2.  Continue clonazepam for anxiety and spasms, oxycodone for pain and modafinil for fatigue and sleepiness.  Continue lamotrigine 100 mg twice a day for dysesthesias.  The PDMP was reviewed and she is compliant with medications and not getting prescriptions from other providers.  She is not showing drug-seeking behavior. 3.  Weight has been stable since the last visit.  She is advised to try to gain other bit more weight.   4.   rtc 4 months, or sooner if new or worsening neurologic symptoms  Jancarlos Thrun A. [place], MD, PhD 11/28/2019, 6:27 PM Certified in Neurology, Clinical Neurophysiology, Sleep Medicine, Pain Medicine and Neuroimaging  [facility] [address], [address] [place], [place] [phone]

## 2019-11-28 NOTE — Telephone Encounter (Signed)
Placed JCV lab in [facility] lock box for routine lab pick up. Results pending. 

## 2019-11-29 LAB — CBC WITH DIFFERENTIAL/PLATELET
Basophils Absolute: 0.1 10*3/uL (ref 0.0–0.2)
Basos: 1 %
EOS (ABSOLUTE): 0.1 10*3/uL (ref 0.0–0.4)
Eos: 1 %
Hematocrit: 44.7 % (ref 34.0–46.6)
Hemoglobin: 14.9 g/dL (ref 11.1–15.9)
Immature Grans (Abs): 0 10*3/uL (ref 0.0–0.1)
Immature Granulocytes: 1 %
Lymphocytes Absolute: 3 10*3/uL (ref 0.7–3.1)
Lymphs: 39 %
MCH: 32.2 pg (ref 26.6–33.0)
MCHC: 33.3 g/dL (ref 31.5–35.7)
MCV: 97 fL (ref 79–97)
Monocytes Absolute: 0.6 10*3/uL (ref 0.1–0.9)
Monocytes: 7 %
Neutrophils Absolute: 4 10*3/uL (ref 1.4–7.0)
Neutrophils: 51 %
Platelets: 257 10*3/uL (ref 150–450)
RBC: 4.63 x10E6/uL (ref 3.77–5.28)
RDW: 13.7 % (ref 11.7–15.4)
WBC: 7.8 10*3/uL (ref 3.4–10.8)

## 2019-12-10 NOTE — Telephone Encounter (Signed)
JCV ab drawn on 11/28/19 negative, index: 0.13

## 2019-12-30 ENCOUNTER — Other Ambulatory Visit: Payer: Self-pay | Admitting: Neurology

## 2019-12-30 MED ORDER — OXYCODONE-ACETAMINOPHEN 5-325 MG PO TABS: ORAL_TABLET | ORAL | 0 refills | Status: DC

## 2019-12-30 NOTE — Addendum Note (Signed)
Addended by: Geronimo Running A on: 12/30/2019 02:34 PM   Modules accepted: Orders

## 2019-12-30 NOTE — Telephone Encounter (Signed)
Pt is due for a refill of percocet. Pt is up to date on her appts. [place] Controlled Substance Registry checked and is appropriate.

## 2019-12-30 NOTE — Telephone Encounter (Signed)
Pt has called for a refill on her oxyCODONE-acetaminophen (PERCOCET/ROXICET) 5-325 MG tablet TO [facility] [phone]

## 2019-12-31 ENCOUNTER — Other Ambulatory Visit: Payer: Medicare Other

## 2020-01-02 DIAGNOSIS — G35 Multiple sclerosis: Secondary | ICD-10-CM | POA: Diagnosis not present

## 2020-01-11 ENCOUNTER — Other Ambulatory Visit: Payer: Medicare Other

## 2020-01-21 ENCOUNTER — Telehealth: Payer: Self-pay

## 2020-01-21 NOTE — Telephone Encounter (Signed)
I called [facility] to obtain pt's last and next tysabri infusion dates. I spoke with [facility]. She reports that pt is still non compliant with tysabri infusions as already told to our office.  Pt had an infusion on 11/07/2019, no showed her appt on 12/05/2019, but had an infusion on 01/02/2020. Her next appt is scheduled for 01/30/2020.

## 2020-01-21 NOTE — Telephone Encounter (Signed)
FYI

## 2020-01-29 ENCOUNTER — Other Ambulatory Visit: Payer: Self-pay | Admitting: Neurology

## 2020-01-29 MED ORDER — OXYCODONE-ACETAMINOPHEN 5-325 MG PO TABS: ORAL_TABLET | ORAL | 0 refills | Status: DC

## 2020-01-29 NOTE — Addendum Note (Signed)
Addended by: Arther Abbott on: 01/29/2020 03:05 PM   Modules accepted: Orders

## 2020-01-29 NOTE — Telephone Encounter (Signed)
Pt is requesting a refill for  oxyCODONE-acetaminophen (PERCOCET/ROXICET) 5-325 MG tablet.  Pharmacy: [facility] #[id number]   

## 2020-02-11 NOTE — Telephone Encounter (Signed)
I called Sabrina [name]. Pt did not show for her 7/22/221 tysabri infusion. They won't reschedule it until she calls them.

## 2020-02-17 ENCOUNTER — Telehealth: Payer: Self-pay | Admitting: Neurology

## 2020-02-17 NOTE — Telephone Encounter (Signed)
Edwards,Teresa(mother on DPR) has called to report that on 08-01 pt vommited about 4-5 times that day, she was very weak, very sweaty was in bed all day. Mother states there was a lot of confusion, pt's left foot was blood red, partially up entire foot.  Since pt walks with difficulty, left foot not bending like it should, changes in vision.  Pt mentions seeing flashes of white light, There is are concerns about pt's confusion and being disoriented.  Mother is also wanting to discuss the ordering of the needed MRI before pt's next Tysabri. Please call pt's mother

## 2020-02-18 NOTE — Telephone Encounter (Signed)
Per Dr. Epimenio Foot- he spoke with pt. She is scheduled for her MRI on 03/05/20. She will plan on getting Tysabri tomorrow as planned. Ok per Dr. Epimenio Foot. Nothing further needed.

## 2020-02-18 NOTE — Telephone Encounter (Signed)
Called Teresa back. Relayed message below. She will cx Avory's Tysabri infusion for tomorrow. She will call [facility] at [phone] to get MRI brain scheduled that Dr. Epimenio Foot ordered back in May. She will call back if she has any issues getting scheduled. Aware he wants to review results before next Tysbari infusion to determine if there has been any changes.

## 2020-02-18 NOTE — Telephone Encounter (Signed)
Spoke with Dr. Epimenio Foot, he would like her to cancel Tysabri infusion for tomorrow and get MRI brain first to determine if there have been any changes.

## 2020-02-18 NOTE — Telephone Encounter (Addendum)
Called mother to further discuss. She states that her daughter no longer is vomiting. Her leg sx have resolved. Her leg is no longer red.Denies any signs/sx of infection.  She still feels very tired and is pale looking. She has noticed speech problems with her (dyslexia). Also has noticed in the last month that her vision has worsened. She is having blurry vision/flashing lights that are intermittent. She has not seen an eye doctor recently but planning on making an appt. She has her Tysabri scheduled for tomorrow. Was supposed to be last week but r/s d/t feeling sick. Advised I will discuss with MD and call her back with recommendation.

## 2020-02-19 DIAGNOSIS — G35 Multiple sclerosis: Secondary | ICD-10-CM | POA: Diagnosis not present

## 2020-02-28 ENCOUNTER — Other Ambulatory Visit: Payer: Self-pay | Admitting: Neurology

## 2020-02-28 NOTE — Telephone Encounter (Signed)
Pt is needing a refill on her oxyCODONE-acetaminophen (PERCOCET/ROXICET) 5-325 MG tablet sent to the [facility] in [place]

## 2020-02-29 NOTE — Telephone Encounter (Signed)
Patient called again for refill of opioids. Explained our policy is not to refill controlled substances after hours/weekend. Unfortunately she did call Friday morning but does not appear it was addressed, at this time I do not have a way to access the drug database please address first thing Monday morning.  Jael, who was this forwarded to Friday morning?

## 2020-03-02 MED ORDER — OXYCODONE-ACETAMINOPHEN 5-325 MG PO TABS: ORAL_TABLET | ORAL | 0 refills | Status: DC

## 2020-03-02 NOTE — Addendum Note (Signed)
Addended by: Arther Abbott on: 03/02/2020 07:05 AM   Modules accepted: Orders

## 2020-03-05 ENCOUNTER — Other Ambulatory Visit: Payer: Medicare Other

## 2020-03-15 ENCOUNTER — Other Ambulatory Visit: Payer: Self-pay | Admitting: Neurology

## 2020-03-17 ENCOUNTER — Other Ambulatory Visit: Payer: Self-pay | Admitting: *Deleted

## 2020-03-17 MED ORDER — MODAFINIL 200 MG PO TABS: ORAL_TABLET | ORAL | 5 refills | Status: DC

## 2020-03-17 NOTE — Telephone Encounter (Signed)
Hello Sabrina [name], Refill requested on weekend. Patient ran out of modafinil. Would you like to change to more  refills or 90 days? I gave 30 with 2 refills. CD

## 2020-03-22 ENCOUNTER — Other Ambulatory Visit: Payer: Medicare Other

## 2020-03-30 ENCOUNTER — Other Ambulatory Visit: Payer: Self-pay | Admitting: Neurology

## 2020-03-30 MED ORDER — OXYCODONE-ACETAMINOPHEN 5-325 MG PO TABS: ORAL_TABLET | ORAL | 0 refills | Status: DC

## 2020-03-30 NOTE — Telephone Encounter (Signed)
Pt is requesting a refill for  oxyCODONE-acetaminophen (PERCOCET/ROXICET) 5-325 MG tablet TO .  Pharmacy: [facility] [phone]

## 2020-03-30 NOTE — Addendum Note (Signed)
Addended by: Arther Abbott on: 03/30/2020 10:27 AM   Modules accepted: Orders

## 2020-04-01 ENCOUNTER — Encounter: Payer: Self-pay | Admitting: Neurology

## 2020-04-01 ENCOUNTER — Telehealth: Payer: Self-pay | Admitting: *Deleted

## 2020-04-01 ENCOUNTER — Ambulatory Visit (INDEPENDENT_AMBULATORY_CARE_PROVIDER_SITE_OTHER): Payer: Medicare Other | Admitting: Neurology

## 2020-04-01 VITALS — BP 100/80 | HR 79 | Ht 64.0 in | Wt 89.5 lb

## 2020-04-01 DIAGNOSIS — G8929 Other chronic pain: Secondary | ICD-10-CM | POA: Diagnosis not present

## 2020-04-01 DIAGNOSIS — R208 Other disturbances of skin sensation: Secondary | ICD-10-CM

## 2020-04-01 DIAGNOSIS — G35 Multiple sclerosis: Secondary | ICD-10-CM | POA: Diagnosis not present

## 2020-04-01 DIAGNOSIS — Z79899 Other long term (current) drug therapy: Secondary | ICD-10-CM | POA: Diagnosis not present

## 2020-04-01 DIAGNOSIS — R269 Unspecified abnormalities of gait and mobility: Secondary | ICD-10-CM | POA: Diagnosis not present

## 2020-04-01 MED ORDER — DULOXETINE HCL 60 MG PO CPEP: ORAL_CAPSULE | ORAL | 3 refills | Status: DC

## 2020-04-01 NOTE — Progress Notes (Signed)
u  [facility]  PATIENT: Sabrina [name] DOB: Sep 05, 1980    _________________________________   HISTORICAL  CHIEF COMPLAINT:  Chief Complaint  Patient presents with  . Follow-up    RM 13, alone. Last seen 11/28/2019. Since weather has colled, sx have improved some. Has MRI scheduled for tomorrow at [facility] imaging. She was supposed to have it sooner but MRI machine broke and she has to be r/s.   . Multiple Sclerosis    On Tysabri. Last infusion: sometime in August, she does not remember exact date.  Last JCV 11/28/19 negative, index: 0.13    HISTORY OF PRESENT ILLNESS:  Sabrina [name] is a 39 y.o. woman with multiple sclerosis and chronic pain.     Update 04/01/2020: She is on Tysabri for RRMS.   She tolerates it well.   She has no recent exacerbation.  She has been JCV antibody negative.  Her last test was 11/28/2019 and was negative (0.13)  She is walking about the same.  She notes reduced balance.  No falls recently.  She has some numbness and tingling in her left leg.  She reports urinary urgency and also has some incomplete emptying. .  She has stopped ditropan and tries to make sure she urinates before leaving the house.    She has some hesitancy as well as urgency.  She uses Depends at night.      She has fatigue and somnolence, helped by modafinil.    She reports a lot of buttock and leg pain       She is on Percocet 5 mg qid but feels it is not helping enough.   She is also on lamotrigine, cyclobenzaprine (takes prn), clonazepam 0.5 once a day and occassionally at night.   Injections (piriformis) did not help.  The PDMP was reviewed and she is not getting med's from other sources and does not have drug seeking behavior.    She has gained one more pounds (89 pounds)   She is off Megace.  She got her Covid-19 vaccination and tolerated it well (April 2021 - Moderna).  We discussed getting the booster.      MS History:  She had right optic neuritis in December  2012 and had an MRI of the brain showing optic nerve inflammation and many white matter spots.    She saw Dr. Macon Large in [place].   She was started on Copaxone.  She likely had an exacerbation in 2013 when she had trouble walking x 2 months.   She received a few days of IV Solu-Medrol.  Last year, she also received a few days of IV Solu-Medrol but she is uncertain   She felt she did well for the first 2 years but switched to Gilenya because she was tired of the shots last year.   At first, she tolerated it well but then her depression got much worse and she had some stomach issues so she returned to Copaxone last month (20 mg daily).   MRI early 2016 showed multiple old MS plaques and 1  Enhancing focus in the right frontal lobe prompting change to Tysabi.   She has been on Tysabri since 2016.  MRI Brain/spine 02/2017:  MRI of the brain and MRI of the thoracic spine performed during her recent hospitalization. The MRI of the brain shows many T2/FLAIR hyperintense foci in the periventricular, juxtacortical and deep white matter. Her to be any change when compared to her previous MRI. The MRI of the thoracic spine  shows multiple T2 hyperintense lesions. The T6-T7 lesion is fairly large and there could be slight expansion of the spinal cord. However, there is no enhancement.          REVIEW OF SYSTEMS: Constitutional: No fevers, chills, sweats, or change in appetite.  Notes a lot of fatigue Eyes: No visual changes, double vision, eye pain Ear, nose and throat: No hearing loss, ear pain, nasal congestion, sore throat Cardiovascular: No chest pain, palpitations Respiratory: No shortness of breath at rest or with exertion.   No wheezes GastrointestinaI: No nausea, vomiting, diarrhea, abdominal pain, fecal incontinence Genitourinary: as above. Musculoskeletal: No neck pain, back pain Integumentary: No rash, pruritus, skin lesions Neurological: as above Psychiatric: Notes some depression and  anxiety Endocrine: No palpitations, diaphoresis, change in appetite, change in weigh or increased thirst Hematologic/Lymphatic: No anemia, purpura, petechiae. Allergic/Immunologic: No itchy/runny eyes, nasal congestion, recent allergic reactions, rashes  ALLERGIES: Allergies  Allergen Reactions  . Amoxicillin Anaphylaxis  . Penicillins Anaphylaxis  . Sulfa Antibiotics Other (See Comments)    unknown  . Codeine Nausea And Vomiting    HOME MEDICATIONS:  Current Outpatient Medications:  .  clonazePAM (KLONOPIN) 0.5 MG tablet, TAKE 1 TABLET BY MOUTH THREE TIMES DAILY AS NEEDED (Patient taking differently: 0.5 mg 2 (two) times daily. ), Disp: 60 tablet, Rfl: 5 .  cyclobenzaprine (FLEXERIL) 5 MG tablet, TAKE 1 TABLET(5 MG) BY MOUTH EVERY 8 HOURS AS NEEDED FOR MUSCLE SPASMS, Disp: 90 tablet, Rfl: 5 .  DULoxetine (CYMBALTA) 60 MG capsule, TAKE 1 CAPSULE(60 MG) BY MOUTH DAILY, Disp: 90 capsule, Rfl: 3 .  feeding supplement, ENSURE ENLIVE, (ENSURE ENLIVE) LIQD, Take 237 mLs by mouth 2 (two) times daily between meals., Disp: 237 mL, Rfl: 12 .  lamoTRIgine (LAMICTAL) 100 MG tablet, TAKE 1 TABLET(100 MG) BY MOUTH TWICE DAILY, Disp: 180 tablet, Rfl: 3 .  modafinil (PROVIGIL) 200 MG tablet, Take 1 tablet by mouth once daily, Disp: 30 tablet, Rfl: 2 .  modafinil (PROVIGIL) 200 MG tablet, TAKE 1 TABLET(200 MG) BY MOUTH DAILY, Disp: 30 tablet, Rfl: 5 .  natalizumab (TYSABRI) 300 MG/15ML injection, Inject 15 mLs (300 mg total) into the vein every 30 (thirty) days., Disp: 15 mL, Rfl: 11 .  oxybutynin (DITROPAN) 5 MG tablet, Take 1 tablet (5 mg total) by mouth 2 (two) times daily as needed for bladder spasms., Disp: 60 tablet, Rfl: 11 .  oxyCODONE-acetaminophen (PERCOCET/ROXICET) 5-325 MG tablet, One po qid prn, Disp: 120 tablet, Rfl: 0 .  ranitidine (ZANTAC) 150 MG tablet, Take 150 mg by mouth 2 (two) times daily., Disp: , Rfl:  .  VITAMIN D PO, Take 1,000 Units by mouth daily., Disp: , Rfl:   PAST  MEDICAL HISTORY: Past Medical History:  Diagnosis Date  . Chronic pain syndrome   . Depression   . Fibromyalgia   . GAD (generalized anxiety disorder)   . Headache   . Multiple sclerosis (HCC) 10/28/2014  . Obsessive compulsive disorder   . Vision abnormalities     PAST SURGICAL HISTORY: Past Surgical History:  Procedure Laterality Date  . NO PAST SURGERIES      FAMILY HISTORY: Family History  Problem Relation Age of Onset  . Healthy Mother   . Diabetes type II Father   . Prostate cancer Father     SOCIAL HISTORY:  Social History   Socioeconomic History  . Marital status: Married    Spouse name: Not on file  . Number of children: Not on file  .  Years of education: Not on file  . Highest education level: Not on file  Occupational History  . Not on file  Tobacco Use  . Smoking status: Current Every Day Smoker    Packs/day: 0.50    Types: Cigarettes  . Smokeless tobacco: Never Used  Vaping Use  . Vaping Use: Never used  Substance and Sexual Activity  . Alcohol use: No    Alcohol/week: 0.0 standard drinks  . Drug use: No  . Sexual activity: Not on file  Other Topics Concern  . Not on file  Social History Narrative  . Not on file   Social Determinants of Health   Financial Resource Strain:   . Difficulty of Paying Living Expenses: Not on file  Food Insecurity:   . Worried About Programme researcher, broadcasting/film/video in the Last Year: Not on file  . Ran Out of Food in the Last Year: Not on file  Transportation Needs:   . Lack of Transportation (Medical): Not on file  . Lack of Transportation (Non-Medical): Not on file  Physical Activity:   . Days of Exercise per Week: Not on file  . Minutes of Exercise per Session: Not on file  Stress:   . Feeling of Stress : Not on file  Social Connections:   . Frequency of Communication with Friends and Family: Not on file  . Frequency of Social Gatherings with Friends and Family: Not on file  . Attends Religious Services: Not on  file  . Active Member of Clubs or Organizations: Not on file  . Attends Banker Meetings: Not on file  . Marital Status: Not on file  Intimate Partner Violence:   . Fear of Current or Ex-Partner: Not on file  . Emotionally Abused: Not on file  . Physically Abused: Not on file  . Sexually Abused: Not on file     PHYSICAL EXAM  Vitals:   04/01/20 1426  BP: 100/80  Pulse: 79  SpO2: 97%  Weight: 89 lb 8 oz (40.6 kg)  Height: 5\' 4"  (1.626 m)    Body mass index is 15.36 kg/m.   General: The patient is well-developed and well-nourished and in no acute distress  Musculoskeletal:   Less lower back tenderness  Neurologic Exam  Mental status:  The patient is alert and oriented x 3 at the time of the examination. The patient has apparent normal recent and remote memory, with good attention span and concentration ability and normal speech today  Cranial nerves: Extraocular movements are full.  Facial strength and sensation was normal.  Trapezius strength was normal.  Hearing was normal and symmetric.  Motor:  Muscle bulk is normal.   Muscle tone is mildly increased in legs. Strength is 5/5.   Sensory: She has normal sensation in her arms.  She has reduced vibration sensation in the toes.  Coordination: Cerebellar testing reveals good finger-nose-finger bilaterally.  She has mildly reduced heel-to-shin bilaterally.  Gait and station: Station is normal.  Gait is mildly wide and tandem gait is moderately wide.. Romberg is negative.  Reflexes: Deep tendon reflexes are normal in the arms increased bilaterally with nonsustained clonus at the ankles      ASSESSMENT AND PLAN  Multiple sclerosis (HCC) - Plan: Stratify JCV Antibody Test ([facility]), CBC with Differential/Platelet  Dysesthesia  Other chronic pain  Gait disorder  High risk medication use - Plan: Stratify JCV Antibody Test ([facility]), CBC with Differential/Platelet   1.  Continue Tysabri 300 mg every 4  weeks.  We will check JCV antibody and CBC with differential today.  She will continue Tysabri infusions in [place].   Check brain MRI to determine if subclinical preogression 2.  Continue clonazepam for anxiety and spasms, oxycodone for pain and modafinil for fatigue and sleepiness.  Continue lamotrigine 100 mg twice a day for dysesthesias.  The PDMP was reviewed and she is compliant with medications and not getting prescriptions from other providers.  She is not showing drug-seeking behavior. 3. Stay active and exercise as tolerated. 4.   Get booster when available 5.   rtc 4 months, or sooner if new or worsening neurologic symptoms  Arlina Sabina A. Epimenio Foot, MD, PhD 04/01/2020, 3:05 PM Certified in Neurology, Clinical Neurophysiology, Sleep Medicine, Pain Medicine and Neuroimaging  [facility] [address] [place], [place] [address] [phone]

## 2020-04-01 NOTE — Telephone Encounter (Signed)
Placed JCV lab in [facility] lock box for routine lab pick up. Results pending. 

## 2020-04-02 ENCOUNTER — Telehealth: Payer: Self-pay | Admitting: *Deleted

## 2020-04-02 ENCOUNTER — Other Ambulatory Visit: Payer: Self-pay

## 2020-04-02 ENCOUNTER — Ambulatory Visit
Admission: RE | Admit: 2020-04-02 | Discharge: 2020-04-02 | Disposition: A | Discharge status: Home / Self Care | Payer: Medicare Other | Source: Ambulatory Visit | Attending: Neurology | Admitting: Neurology

## 2020-04-02 DIAGNOSIS — G35 Multiple sclerosis: Secondary | ICD-10-CM | POA: Diagnosis not present

## 2020-04-02 LAB — CBC WITH DIFFERENTIAL/PLATELET
Basophils Absolute: 0.1 10*3/uL (ref 0.0–0.2)
Basos: 1 %
EOS (ABSOLUTE): 0.1 10*3/uL (ref 0.0–0.4)
Eos: 1 %
Hematocrit: 39 % (ref 34.0–46.6)
Hemoglobin: 13.7 g/dL (ref 11.1–15.9)
Immature Grans (Abs): 0.1 10*3/uL (ref 0.0–0.1)
Immature Granulocytes: 1 %
Lymphocytes Absolute: 3.9 10*3/uL — ABNORMAL HIGH (ref 0.7–3.1)
Lymphs: 50 %
MCH: 34.1 pg — ABNORMAL HIGH (ref 26.6–33.0)
MCHC: 35.1 g/dL (ref 31.5–35.7)
MCV: 97 fL (ref 79–97)
Monocytes Absolute: 0.6 10*3/uL (ref 0.1–0.9)
Monocytes: 8 %
Neutrophils Absolute: 3 10*3/uL (ref 1.4–7.0)
Neutrophils: 39 %
Platelets: 283 10*3/uL (ref 150–450)
RBC: 4.02 x10E6/uL (ref 3.77–5.28)
RDW: 12.3 % (ref 11.7–15.4)
WBC: 7.7 10*3/uL (ref 3.4–10.8)

## 2020-04-02 MED ORDER — GADOBENATE DIMEGLUMINE 529 MG/ML IV SOLN
9.0000 mL | Freq: Once | INTRAVENOUS | Status: AC | PRN
  Administered 2020-04-02: 9 mL via INTRAVENOUS

## 2020-04-02 NOTE — Telephone Encounter (Signed)
Called and spoke with pt about results per Dr. Frances Furbish note. She verbalized understanding.

## 2020-04-02 NOTE — Telephone Encounter (Signed)
-----   Message from Huston Foley, MD sent at 04/02/2020  1:49 PM EDT ----- CBC with differential was benign, with the exception of slight increase in [facility] which is new compared to 3 months ago, sometimes can indicate vitamin B12 deficiency.  Also, mild increase in lymphocytes.  Not in the severe range.  I would recommend that she get her B12 level checked through her primary care physician at the next appointment.  Please inform patient.

## 2020-04-06 ENCOUNTER — Telehealth: Payer: Self-pay

## 2020-04-06 NOTE — Telephone Encounter (Signed)
I called [facility] to find out pt's last and next tysabri infusion dates. No answer, left a VM asking them to call back.  When [facility] calls back please let us know the pt's next and last tysabri infusion dates.

## 2020-04-06 NOTE — Telephone Encounter (Signed)
JCV ab collected on 04/01/2020 negative, index: 0.18

## 2020-04-13 ENCOUNTER — Other Ambulatory Visit: Payer: Self-pay | Admitting: *Deleted

## 2020-04-13 ENCOUNTER — Telehealth: Payer: Self-pay | Admitting: Neurology

## 2020-04-13 DIAGNOSIS — G35 Multiple sclerosis: Secondary | ICD-10-CM

## 2020-04-13 MED ORDER — NATALIZUMAB 300 MG/15ML IV CONC
300.0000 mg | INTRAVENOUS | 11 refills | Status: AC
Start: 2020-04-13 — End: ?

## 2020-04-13 NOTE — Telephone Encounter (Signed)
Faxed printed/signed order for Tysabri to [facility] at [phone]. Received fax confirmation.

## 2020-04-13 NOTE — Telephone Encounter (Signed)
[facility] Sabrina [name]) called, natalizumab (TYSABRI) 300 MG/15ML injection is about to expire in a couple of days. Can you fax new order to [phone]

## 2020-04-13 NOTE — Telephone Encounter (Signed)
Order printed, waiting on MD signature and then will fax 

## 2020-04-28 ENCOUNTER — Other Ambulatory Visit: Payer: Self-pay | Admitting: Neurology

## 2020-04-28 MED ORDER — OXYCODONE-ACETAMINOPHEN 5-325 MG PO TABS
ORAL_TABLET | ORAL | 0 refills | Status: DC
Start: 2020-04-28 — End: 2020-05-26

## 2020-04-28 NOTE — Telephone Encounter (Signed)
Pt request refill oxyCODONE-acetaminophen (PERCOCET/ROXICET) 5-325 MG tablet at [id number]  

## 2020-05-03 ENCOUNTER — Other Ambulatory Visit: Payer: Self-pay | Admitting: Neurology

## 2020-05-25 ENCOUNTER — Telehealth: Payer: Self-pay | Admitting: Neurology

## 2020-05-25 NOTE — Telephone Encounter (Signed)
Marcelino Duster from [employer] called to let us know that question G from reauthorization questionnaire is missing.   Best contact number: [phone]

## 2020-05-25 NOTE — Telephone Encounter (Signed)
G answered and form re-faxed. Received fax confirmation.

## 2020-05-25 NOTE — Telephone Encounter (Signed)
Received fax from touch prescribing program for Tysabri that pt re-authorized from 05/25/20-12/19/20. Pt enrollment number: [id number]. Account: [facility]. Site auth number: [id number].

## 2020-05-26 ENCOUNTER — Other Ambulatory Visit: Payer: Self-pay | Admitting: Neurology

## 2020-05-26 MED ORDER — OXYCODONE-ACETAMINOPHEN 5-325 MG PO TABS
ORAL_TABLET | ORAL | 0 refills | Status: DC
Start: 2020-05-26 — End: 2020-06-26

## 2020-05-26 NOTE — Addendum Note (Signed)
Addended by: Arther Abbott on: 05/26/2020 10:23 AM   Modules accepted: Orders

## 2020-05-26 NOTE — Telephone Encounter (Signed)
Pt is requesting a refill for  oxyCODONE-acetaminophen (PERCOCET/ROXICET) 5-325 MG tablet.  Pharmacy: [facility] #[id number]   

## 2020-06-23 NOTE — Telephone Encounter (Signed)
New order was faxed to [facility] on 04/13/20. I re-faxed this order to fax below. Received fax confirmation.

## 2020-06-23 NOTE — Telephone Encounter (Signed)
Sabrina [name] from [facility] in [place], [place] needs orders for Tysabri.   Fax number: [phone]

## 2020-06-26 ENCOUNTER — Other Ambulatory Visit: Payer: Self-pay | Admitting: Neurology

## 2020-06-26 MED ORDER — OXYCODONE-ACETAMINOPHEN 5-325 MG PO TABS
ORAL_TABLET | ORAL | 0 refills | Status: DC
Start: 2020-06-26 — End: 2020-07-28

## 2020-06-26 NOTE — Telephone Encounter (Signed)
Pt request refill oxyCODONE-acetaminophen (PERCOCET/ROXICET) 5-325 MG tablet at [id number]  

## 2020-06-30 DIAGNOSIS — G35 Multiple sclerosis: Secondary | ICD-10-CM | POA: Diagnosis not present

## 2020-07-17 DIAGNOSIS — Z9229 Personal history of other drug therapy: Secondary | ICD-10-CM | POA: Diagnosis not present

## 2020-07-17 DIAGNOSIS — R110 Nausea: Secondary | ICD-10-CM | POA: Diagnosis not present

## 2020-07-27 ENCOUNTER — Ambulatory Visit: Payer: Medicare Other | Admitting: Family Medicine

## 2020-07-28 ENCOUNTER — Other Ambulatory Visit: Payer: Self-pay | Admitting: Neurology

## 2020-07-28 MED ORDER — OXYCODONE-ACETAMINOPHEN 5-325 MG PO TABS
ORAL_TABLET | ORAL | 0 refills | Status: DC
Start: 2020-07-28 — End: 2020-08-27

## 2020-07-28 NOTE — Telephone Encounter (Signed)
Pt called and LVM stating she is needing a refill on her oxyCODONE-acetaminophen (PERCOCET/ROXICET) 5-325 MG tablet sent in to the [facility] on [address]

## 2020-07-29 ENCOUNTER — Other Ambulatory Visit: Payer: Self-pay | Admitting: Neurology

## 2020-07-29 MED ORDER — CLONAZEPAM 0.5 MG PO TABS: 0.5000 mg | ORAL_TABLET | Freq: Three times a day (TID) | ORAL | 5 refills | Status: DC | PRN

## 2020-07-29 NOTE — Telephone Encounter (Signed)
Pt request refill clonazePAM (KLONOPIN) 0.5 MG tablet at [facility] [phone]

## 2020-08-10 ENCOUNTER — Telehealth: Payer: Self-pay

## 2020-08-10 NOTE — Telephone Encounter (Signed)
I called [facility].  Patient's last Tysabri infusion was June 30, 2020.  She no showed her infusion on July 28, 2020.  Her next scheduled Tysabri infusion is August 28, 2020.

## 2020-08-20 ENCOUNTER — Encounter: Payer: Self-pay | Admitting: Family Medicine

## 2020-08-20 ENCOUNTER — Ambulatory Visit (INDEPENDENT_AMBULATORY_CARE_PROVIDER_SITE_OTHER): Payer: Medicare Other | Admitting: Family Medicine

## 2020-08-20 VITALS — BP 123/86 | HR 78 | Ht 64.0 in | Wt 87.2 lb

## 2020-08-20 DIAGNOSIS — F09 Unspecified mental disorder due to known physiological condition: Secondary | ICD-10-CM | POA: Diagnosis not present

## 2020-08-20 DIAGNOSIS — R5383 Other fatigue: Secondary | ICD-10-CM | POA: Diagnosis not present

## 2020-08-20 DIAGNOSIS — E559 Vitamin D deficiency, unspecified: Secondary | ICD-10-CM | POA: Diagnosis not present

## 2020-08-20 DIAGNOSIS — R269 Unspecified abnormalities of gait and mobility: Secondary | ICD-10-CM | POA: Diagnosis not present

## 2020-08-20 DIAGNOSIS — G35 Multiple sclerosis: Secondary | ICD-10-CM

## 2020-08-20 DIAGNOSIS — F32A Depression, unspecified: Secondary | ICD-10-CM | POA: Diagnosis not present

## 2020-08-20 DIAGNOSIS — G8929 Other chronic pain: Secondary | ICD-10-CM

## 2020-08-20 DIAGNOSIS — Z79899 Other long term (current) drug therapy: Secondary | ICD-10-CM

## 2020-08-20 DIAGNOSIS — M62838 Other muscle spasm: Secondary | ICD-10-CM | POA: Diagnosis not present

## 2020-08-20 DIAGNOSIS — R208 Other disturbances of skin sensation: Secondary | ICD-10-CM

## 2020-08-20 MED ORDER — TIZANIDINE HCL 2 MG PO CAPS: 2.0000 mg | ORAL_CAPSULE | Freq: Three times a day (TID) | ORAL | 11 refills | Status: DC

## 2020-08-20 NOTE — Patient Instructions (Addendum)
Below is our plan:  We will continue current treatment plan. I will update labs. Continue oxycodone four times daily, duloxetine 60mg  daily, clonazepam 1-2 times daily as prescribed. I will let Dr know that you have not used lamotrigine recently. I will switch cyclobenzaprine to tizanidine. Please be careful using the heating pad. Take frequent breaks and change positioning of pad. I have placed a referral to family medicine. You can also call your insurance to be assigned to a practice in your area.   Please make sure you are staying well hydrated. I recommend 50-60 ounces daily. Well balanced diet and regular exercise encouraged. Consistent sleep schedule with 6-8 hours recommended.   Please continue follow up with care team as directed.   Follow up with Dr Epimenio Foot in 4- 6 months   You may receive a survey regarding today's visit. I encourage you to leave honest feed back as I do use this information to improve patient care. Thank you for seeing me today!     High-Protein and High-Calorie Diet Eating high-protein and high-calorie foods can help you to gain weight, heal after an injury, and recover after an illness or surgery. The specific amount of daily protein and calories you need depends on:  Your body weight.  The reason this diet is recommended for you. What is my plan? Generally, a high-protein, high-calorie diet involves:  Eating 250-500 extra calories each day.  Making sure that you get enough of your daily calories from protein. Ask your health care provider how many of your calories should come from protein. Talk with a health care provider, such as a diet and nutrition specialist (dietitian), about how much protein and how many calories you need each day. Follow the diet as directed by your health care provider. What are tips for following this plan? Preparing meals  Add whole milk, half-and-half, or heavy cream to cereal, pudding, soup, or hot cocoa.  Add whole  milk to instant breakfast drinks.  Add peanut butter to oatmeal or smoothies.  Add powdered milk to baked goods, smoothies, or milkshakes.  Add powdered milk, cream, or butter to mashed potatoes.  Add cheese to cooked vegetables.  Make whole-milk yogurt parfaits. Top them with granola, fruit, or nuts.  Add cottage cheese to your fruit.  Add avocado, cheese, or both to sandwiches or salads.  Add meat, poultry, or seafood to rice, pasta, casseroles, salads, and soups.  Use mayonnaise when making egg salad, chicken salad, or tuna salad.  Use peanut butter as a dip for vegetables or as a topping for pretzels, celery, or crackers.  Add beans to casseroles, dips, and spreads.  Add pureed beans to sauces and soups.  Replace calorie-free drinks with calorie-containing drinks, such as milk and fruit juice.  Replace water with milk or heavy cream when making foods such as oatmeal, pudding, or cocoa. General instructions  Ask your health care provider if you should take a nutritional supplement.  Try to eat six small meals each day instead of three large meals.  Eat a balanced diet. In each meal, include one food that is high in protein.  Keep nutritious snacks available, such as nuts, trail mixes, dried fruit, and yogurt.  If you have kidney disease or diabetes, talk with your health care provider about how much protein is safe for you. Too much protein may put extra stress on your kidneys.  Drink your calories. Choose high-calorie drinks and have them after your meals.   What high-protein foods  should I eat? Vegetables Soybeans. Peas. Grains Quinoa. Bulgur wheat. Meats and other proteins Beef, pork, and poultry. Fish and seafood. Eggs. Tofu. Textured vegetable protein (TVP). Peanut butter. Nuts and seeds. Dried beans. Protein powders. Dairy Whole milk. Whole-milk yogurt. Powdered milk. Cheese. [employer]. Eggnog. Beverages High-protein supplement drinks. Soy  milk. Other foods Protein bars. The items listed above may not be a complete list of high-protein foods and beverages. Contact a dietitian for more options.   What high-calorie foods should I eat? Fruits Dried fruit. Fruit leather. Canned fruit in syrup. Fruit juice. Avocado. Vegetables Vegetables cooked in oil or butter. Fried potatoes. Grains Pasta. Quick breads. Muffins. Pancakes. Ready-to-eat cereal. Meats and other proteins Peanut butter. Nuts and seeds. Dairy Heavy cream. Whipped cream. Cream cheese. Sour cream. Ice cream. Custard. Pudding. Beverages Meal-replacement beverages. Nutrition shakes. Fruit juice. Sugar-sweetened soft drinks. Seasonings and condiments Salad dressing. Mayonnaise. Alfredo sauce. Fruit preserves or jelly. Honey. Syrup. Sweets and desserts Cake. Cookies. Pie. Pastries. Candy bars. Chocolate. Fats and oils Butter or margarine. Oil. Gravy. Other foods Meal-replacement bars. The items listed above may not be a complete list of high-calorie foods and beverages. Contact a dietitian for more options. Summary  A high-protein, high-calorie diet can help you gain weight or heal faster after an injury, illness, or surgery.  To increase your protein and calories, add ingredients such as whole milk, peanut butter, cheese, beans, meat, or seafood to meal items.  To get enough extra calories each day, include high-calorie foods and beverages at each meal.  Adding a high-calorie drink or shake can be an easy way to help you get enough calories each day. Talk with your healthcare provider or dietitian about the best options for you. This information is not intended to replace advice given to you by your health care provider. Make sure you discuss any questions you have with your health care provider. Document Revised: 06/09/2017 Document Reviewed: 05/09/2017 Elsevier Patient Education  2021 [employer] Inc[employer]   Multiple Sclerosis Multiple sclerosis (MS) is a disease  of the brain, spinal cord, and optic nerves (central nervous system). It causes the body's disease-fighting (immune) system to destroy the protective covering (myelin sheath) around nerves in the brain. When this happens, signals (nerve impulses) going to and from the brain and spinal cord do not get sent properly or may not get sent at all. There are several types of MS:  Relapsing-remitting MS. This is the most common type. This causes sudden attacks of symptoms. After an attack, you may recover completely until the next attack, or some symptoms may remain permanently.  Secondary progressive MS. This usually develops after the onset of relapsing-remitting MS. Similar to relapsing-remitting MS, this type also causes sudden attacks of symptoms. Attacks may be less frequent, but symptoms slowly get worse (progress) over time.  Primary progressive MS. This causes symptoms that steadily progress over time. This type of MS does not cause sudden attacks of symptoms. The age of onset of MS varies, but it often develops between 60-63 years of age. MS is a lifelong (chronic) condition. There is no cure, but treatment can help slow down the progression of the disease. What are the causes? The cause of this condition is not known. What increases the risk? You are more likely to develop this condition if:  You are a woman.  You have a relative with MS. However, the condition is not passed from parent to child (inherited).  You have a lack (deficiency) of vitamin D.  You smoke. MS is more common in the [place] than in the [place]. What are the signs or symptoms? Relapsing-remitting and secondary progressive MS cause symptoms to occur in episodes or attacks that may last weeks to months. There may be long periods between attacks in which there are almost no symptoms. Primary progressive MS causes symptoms to steadily progress after they develop. Symptoms of MS vary because of  the many different ways it affects the central nervous system. The main symptoms include:  Vision problems and eye pain.  Numbness and weakness.  Inability to move your arms, hands, feet, or legs (paralysis).  Balance problems.  Shaking that you cannot control (tremors).  Muscle spasms.  Problems with thinking (cognitive changes). MS can also cause symptoms that are associated with the disease, but are not always the direct result of an MS attack. They may include:  Inability to control urination or bowel movements (incontinence).  Headaches.  Fatigue.  Inability to tolerate heat.  Emotional changes.  Depression.  Pain. How is this diagnosed? This condition is diagnosed based on:  Your symptoms.  A neurological exam. This involves checking central nervous system function, such as nerve function, reflexes, and coordination.  MRIs of the brain and spinal cord.  Lab tests, including a lumbar puncture that tests the fluid that surrounds the brain and spinal cord (cerebrospinal fluid).  Tests to measure the electrical activity of the brain in response to stimulation (evoked potentials). How is this treated? There is no cure for MS, but medicines can help decrease the number and frequency of attacks and help relieve nuisance symptoms. Treatment options may include:  Medicines that reduce the frequency of attacks. These medicines may be given by injection, by mouth (orally), or through an IV.  Medicines that reduce inflammation (steroids). These may provide short-term relief of symptoms.  Medicines to help control pain, depression, fatigue, or incontinence.  Nutritional counseling. Vitamin D supplements, if you have a deficiency.  Using devices to help you move around (assistive devices), such as braces, a cane, or a walker.  Physical therapy to strengthen and stretch your muscles.  Occupational therapy to help you with everyday tasks.  Alternative or complementary  treatments such as exercise, massage, or acupuncture.   Follow these instructions at home:  Take over-the-counter and prescription medicines only as told by your health care provider.  Do not drive or use heavy machinery while taking prescription pain medicine.  Use assistive devices as recommended by your physical therapist or your health care provider.  Exercise as directed by your health care provider.  Eating healthy can help manage MS symptoms.  Return to your normal activities as told by your health care provider. Ask your health care provider what activities are safe for you.  Reach out for support. Share your feelings with friends, family, or a support group.  Keep all follow-up visits as told by your health care provider and therapists. This is important. Where to find more information  National Multiple Sclerosis Society: https://www.nationalmssociety.org  [employer] of Neurological Disorders and Stroke: https://johnson-smith.net/  [employer] for Complementary and Integrative Health: http://miller-hamilton.net/ Contact a health care provider if:  You feel depressed.  You develop new pain or numbness.  You have tremors.  You have problems with sexual function. Get help right away if:  You develop paralysis.  You develop numbness.  You have problems with your bladder or bowel function.  You develop double vision.  You lose vision in one  or both eyes.  You develop suicidal thoughts.  You develop severe confusion. If you ever feel like you may hurt yourself or others, or have thoughts about taking your own life, get help right away. You can go to your nearest emergency department or call:  Your local emergency services (911 in the [place]  A suicide crisis helpline, such as the National Suicide Prevention Lifeline at 818-797-64121-407-128-0693. This is open 24 hours a day. Summary  Multiple sclerosis (MS) is a disease of the central nervous system that  causes the body's immune system to destroy the protective covering (myelin sheath) around nerves in the brain.  There are 3 types of MS: relapsing-remitting, secondary progressive, and primary progressive. Relapsing-remitting and secondary progressive MS cause symptoms to occur in episodes or attacks that may last weeks to months. Primary progressive MS causes symptoms to steadily progress after they develop.  There is no cure for MS, but medicines can help decrease the number and frequency of attacks and help relieve nuisance symptoms. Treatment may also include physical or occupational therapy.  If you develop numbness, paralysis, vision problems, or other neurological symptoms, get help right away. This information is not intended to replace advice given to you by your health care provider. Make sure you discuss any questions you have with your health care provider. Document Revised: 04/07/2020 Document Reviewed: 04/07/2020 Elsevier Patient Education  2021 [employer]

## 2020-08-20 NOTE — Progress Notes (Signed)
Chief Complaint  Patient presents with  . Follow-up    Rm 1 alone Pt is well, about the same. In pain all the time      HISTORY OF PRESENT ILLNESS: 08/20/20 ALL:   Sabrina [name] is a 40 y.o. female here today for follow up for RRMS. She continues Tysabri infusions. Labs have been stable. She missed last infusion on 07/28/20 and rescheduled for 08/28/20.  MRI in 03/2020 showed stable MS and progressing cerebral atrophy.   She feels that MS symptoms are about the same. She denies new or exacerbating symptoms. No falls. Gait is stable. She continues to have low back, left leg and buttock pain. Oxycodone 5/325mg  QID helps a little. She has not taken lamotrigine in about a year. She did not feel it helped. She rarely takes cyclobenzaprine due to sleepiness. Baclofen also made her sleepy.  She uses heat that helps a little. She has noticed some discoloring of her buttock and posterior thigh over the past few weeks, no obvious rash or skin breakdown.   Mood is stable. She continues duloxetine 60mg  daily and clonazepam 0.5mg  up to TID (usually BID) helps with mood, sleep, and pain. Memory  Sleep  Appetite is ok. Weight is stable. She was 89 last visit 03/2020, 87 today. She continues to drink Ensure 1-2 times daily. She eats a lot of 04/2020 foods. She eats three meals most days, sometimes just twice. She likes chicken and noodles with cheese. She eats a lot of ice cream.   She feels fatigue is about the same. She continues modafinil 200mg  daily. She feels that she would not be able to function without it. She tries to stay active around the home. She does have have an exercise regimen.   Oxybutynin 5mg  BID helps with urinary frequency.     HISTORY (copied from Dr [place] previous note)  Sabrina [name] is a 40 y.o. woman with multiple sclerosis and chronic pain.     Update 04/01/2020: She is on Tysabri for RRMS.   She tolerates it well.   She has no recent exacerbation.  She has been JCV  antibody negative.  Her last test was 11/28/2019 and was negative (0.13)  She is walking about the same.  She notes reduced balance.  No falls recently.  She has some numbness and tingling in her left leg.  She reports urinary urgency and also has some incomplete emptying. .  She has stopped ditropan and tries to make sure she urinates before leaving the house.    She has some hesitancy as well as urgency.  She uses Depends at night.      She has fatigue and somnolence, helped by modafinil.    She reports a lot of buttock and leg pain       She is on Percocet 5 mg qid but feels it is not helping enough.   She is also on lamotrigine, cyclobenzaprine (takes prn), clonazepam 0.5 once a day and occassionally at night.   Injections (piriformis) did not help.  The PDMP was reviewed and she is not getting med's from other sources and does not have drug seeking behavior.    She has gained one more pounds (89 pounds)   She is off Megace.  She got her Covid-19 vaccination and tolerated it well (April 2021 - Moderna).  We discussed getting the booster.      MS History:  She had right optic neuritis in December 2012 and had an MRI of  the brain showing optic nerve inflammation and many white matter spots.    She saw Dr. Macon Large in [place].   She was started on Copaxone.  She likely had an exacerbation in 2013 when she had trouble walking x 2 months.   She received a few days of IV Solu-Medrol.  Last year, she also received a few days of IV Solu-Medrol but she is uncertain   She felt she did well for the first 2 years but switched to Gilenya because she was tired of the shots last year.   At first, she tolerated it well but then her depression got much worse and she had some stomach issues so she returned to Copaxone last month (20 mg daily).   MRI early 2016 showed multiple old MS plaques and 1  Enhancing focus in the right frontal lobe prompting change to Tysabi.   She has been on Tysabri since 2016.  MRI  Brain/spine 02/2017:  MRI of the brain and MRI of the thoracic spine performed during her recent hospitalization. The MRI of the brain shows many T2/FLAIR hyperintense foci in the periventricular, juxtacortical and deep white matter. Her to be any change when compared to her previous MRI. The MRI of the thoracic spine shows multiple T2 hyperintense lesions. The T6-T7 lesion is fairly large and there could be slight expansion of the spinal cord. However, there is no enhancement.        REVIEW OF SYSTEMS: Out of a complete 14 system review of symptoms, the patient complains only of the following symptoms, chronic pain, decreased appetite, discoloration of posterior legs and all other reviewed systems are negative.    ALLERGIES: Allergies  Allergen Reactions  . Amoxicillin Anaphylaxis  . Penicillins Anaphylaxis  . Sulfa Antibiotics Other (See Comments)    unknown  . Codeine Nausea And Vomiting     HOME MEDICATIONS: Outpatient Medications Prior to Visit  Medication Sig Dispense Refill  . clonazePAM (KLONOPIN) 0.5 MG tablet Take 1 tablet (0.5 mg total) by mouth 3 (three) times daily as needed. 60 tablet 5  . DULoxetine (CYMBALTA) 60 MG capsule TAKE 1 CAPSULE(60 MG) BY MOUTH DAILY 90 capsule 3  . feeding supplement, ENSURE ENLIVE, (ENSURE ENLIVE) LIQD Take 237 mLs by mouth 2 (two) times daily between meals. 237 mL 12  . modafinil (PROVIGIL) 200 MG tablet Take 1 tablet by mouth once daily 30 tablet 2  . natalizumab (TYSABRI) 300 MG/15ML injection Inject 15 mLs (300 mg total) into the vein every 30 (thirty) days. 15 mL 11  . oxyCODONE-acetaminophen (PERCOCET/ROXICET) 5-325 MG tablet One po qid prn 120 tablet 0  . ranitidine (ZANTAC) 150 MG tablet Take 150 mg by mouth 2 (two) times daily.    Marland Kitchen VITAMIN D PO Take 1,000 Units by mouth daily.    . cyclobenzaprine (FLEXERIL) 5 MG tablet TAKE 1 TABLET(5 MG) BY MOUTH EVERY 8 HOURS AS NEEDED FOR MUSCLE SPASMS 90 tablet 5  . lamoTRIgine (LAMICTAL) 100  MG tablet TAKE 1 TABLET(100 MG) BY MOUTH TWICE DAILY 180 tablet 3  . modafinil (PROVIGIL) 200 MG tablet TAKE 1 TABLET(200 MG) BY MOUTH DAILY 30 tablet 5  . oxybutynin (DITROPAN) 5 MG tablet Take 1 tablet (5 mg total) by mouth 2 (two) times daily as needed for bladder spasms. 60 tablet 11   No facility-administered medications prior to visit.     PAST MEDICAL HISTORY: Past Medical History:  Diagnosis Date  . Chronic pain syndrome   . Depression   .  Fibromyalgia   . GAD (generalized anxiety disorder)   . Headache   . Multiple sclerosis (HCC) 10/28/2014  . Obsessive compulsive disorder   . Vision abnormalities      PAST SURGICAL HISTORY: Past Surgical History:  Procedure Laterality Date  . NO PAST SURGERIES       FAMILY HISTORY: Family History  Problem Relation Age of Onset  . Healthy Mother   . Diabetes type II Father   . Prostate cancer Father      SOCIAL HISTORY: Social History   Socioeconomic History  . Marital status: Married    Spouse name: Not on file  . Number of children: Not on file  . Years of education: Not on file  . Highest education level: Not on file  Occupational History  . Not on file  Tobacco Use  . Smoking status: Current Every Day Smoker    Packs/day: 0.50    Types: Cigarettes  . Smokeless tobacco: Never Used  Vaping Use  . Vaping Use: Never used  Substance and Sexual Activity  . Alcohol use: No    Alcohol/week: 0.0 standard drinks  . Drug use: No  . Sexual activity: Not on file  Other Topics Concern  . Not on file  Social History Narrative  . Not on file   Social Determinants of Health   Financial Resource Strain: Not on file  Food Insecurity: Not on file  Transportation Needs: Not on file  Physical Activity: Not on file  Stress: Not on file  Social Connections: Not on file  Intimate Partner Violence: Not on file      PHYSICAL EXAM  Vitals:   08/20/20 1250  BP: 123/86  Pulse: 78  Weight: 87 lb 3.2 oz (39.6 kg)   Height: 5\' 4"  (1.626 m)   Body mass index is 14.97 kg/m.   Generalized: thin, in no acute distress  Cardiology: normal rate and rhythm, no murmur auscultated  Respiratory: clear to auscultation bilaterally    Neurological examination  Mentation: Alert oriented to time, place, history taking. Follows all commands speech and language fluent Cranial nerve II-XII: Pupils were equal round reactive to light. Extraocular movements were full, visual field were full on confrontational test. Facial sensation and strength were normal. Head turning and shoulder shrug  were normal and symmetric. Motor: The motor testing reveals 5 over 5 strength of all 4 extremities.  Sensory: Sensory testing is intact to soft touch on all 4 extremities. No evidence of extinction is noted.  Coordination: Cerebellar testing reveals good finger-nose-finger and heel-to-shin bilaterally.  Gait and station: Gait is normal. Tandem gait is mildly wide  Reflexes: Deep tendon reflexes brisk bilaterally      DIAGNOSTIC DATA (LABS, IMAGING, TESTING) - I reviewed patient records, labs, notes, testing and imaging myself where available.  Lab Results  Component Value Date   WBC 7.7 04/01/2020   HGB 13.7 04/01/2020   HCT 39.0 04/01/2020   MCV 97 04/01/2020   PLT 283 04/01/2020      Component Value Date/Time   NA 144 02/23/2017 0452   K 4.7 02/23/2017 0452   CL 114 (H) 02/23/2017 0452   CO2 27 02/23/2017 0452   GLUCOSE 138 (H) 02/23/2017 0452   BUN 11 02/23/2017 0452   CREATININE 0.60 02/23/2017 0452   CALCIUM 9.0 02/23/2017 0452   PROT 5.4 (L) 02/23/2017 0452   ALBUMIN 3.3 (L) 02/23/2017 0452   AST 21 02/23/2017 0452   ALT 13 (L) 02/23/2017 0452   ALKPHOS  50 02/23/2017 0452   BILITOT 0.6 02/23/2017 0452   GFRNONAA >60 02/23/2017 0452   GFRAA >60 02/23/2017 0452   No results found for: CHOL, HDL, LDLCALC, LDLDIRECT, TRIG, CHOLHDL No results found for: IOEV0J No results found for: VITAMINB12 Lab Results   Component Value Date   TSH 1.239 02/21/2017    No flowsheet data found.   No flowsheet data found.   ASSESSMENT AND PLAN  40 y.o. year old female  has a past medical history of Chronic pain syndrome, Depression, Fibromyalgia, GAD (generalized anxiety disorder), Headache, Multiple sclerosis (HCC) (10/28/2014), Obsessive compulsive disorder, and Vision abnormalities. here with   Relapsing remitting multiple sclerosis (HCC) - Plan: CBC with Differential/Platelets, CMP, Stratify JCV Ab (w/ Index) w/ Rflx, Vitamin D, 25-hydroxy, Ambulatory referral to [facility]  High risk medication use - Plan: CBC with Differential/Platelets, CMP, Stratify JCV Ab (w/ Index) w/ Rflx, Vitamin D, 25-hydroxy  Other chronic pain  Dysesthesia  Gait disorder  Other fatigue  Muscle spasticity  Depression, unspecified depression type  Vitamin D deficiency, unspecified  - Plan: Vitamin D, 25-hydroxy  Cognitive dysfunction  Saron feels that MS symptoms are fairly stable. She will continue Tysabri infusions every 4 weeks. We will update labs today. MRI stable in 03/2020. PDMP reviewed and shows appropriate refills. She will continue clonazepam, duloxetine, modafinil, and oxybutynin as prescribed. I will add tizanidine to replace cyclobenzaprine for pain and muscle spasticity. She was encouraged to focus on high calorie diet. Referral to PCP placed. She was advised to limit use of heating pad due to discoloring of posterior upper thigh and buttock bilaterally. Will monitor closely for skin breakdown. No obvious concerns today. She will follow up with Dr Epimenio Foot in 4-6 months.   Orders Placed This Encounter  Procedures  . CBC with Differential/Platelets  . CMP  . Stratify JCV Ab (w/ Index) w/ Rflx  . Vitamin D, 25-hydroxy  . Ambulatory referral to [facility]    Referral Priority:   Routine    Referral Type:   Consultation    Referral Reason:   Specialty Services Required    Requested Specialty:    Family Medicine    Number of Visits Requested:   1     Meds ordered this encounter  Medications  . tizanidine (ZANAFLEX) 2 MG capsule    Sig: Take 1 capsule (2 mg total) by mouth 3 (three) times daily.    Dispense:  90 capsule    Refill:  11    Order Specific Question:   Supervising Provider    Answer:   Anson Fret [id number]      I spent 30 minutes of face-to-face and non-face-to-face time with patient.  This included previsit chart review, lab review, study review, order entry, electronic health record documentation, patient education.    Shawnie Dapper, MSN, FNP-C 08/20/2020, 1:38 PM  [facility] [address], [address] [place], [place] [address] [phone]

## 2020-08-20 NOTE — Progress Notes (Signed)
I have read the note, and I agree with the clinical assessment and plan.  Gaberial Cada A. Nikia Mangino, MD, PhD, FAAN Certified in Neurology, Clinical Neurophysiology, Sleep Medicine, Pain Medicine and Neuroimaging  [facility] [address], [address] [place], [place] [address] [phone]  

## 2020-08-21 LAB — COMPREHENSIVE METABOLIC PANEL
ALT: 8 IU/L (ref 0–32)
AST: 14 IU/L (ref 0–40)
Albumin/Globulin Ratio: 1.8 (ref 1.2–2.2)
Albumin: 4.2 g/dL (ref 3.8–4.8)
Alkaline Phosphatase: 91 IU/L (ref 44–121)
BUN/Creatinine Ratio: 7 — ABNORMAL LOW (ref 9–23)
BUN: 5 mg/dL — ABNORMAL LOW (ref 6–24)
Bilirubin Total: 0.3 mg/dL (ref 0.0–1.2)
CO2: 25 mmol/L (ref 20–29)
Calcium: 9.4 mg/dL (ref 8.7–10.2)
Chloride: 99 mmol/L (ref 96–106)
Creatinine, Ser: 0.67 mg/dL (ref 0.57–1.00)
GFR calc Af Amer: 126 mL/min/{1.73_m2} (ref >59)
GFR calc non Af Amer: 110 mL/min/{1.73_m2} (ref >59)
Globulin, Total: 2.4 g/dL (ref 1.5–4.5)
Glucose: 69 mg/dL (ref 65–99)
Potassium: 5.1 mmol/L (ref 3.5–5.2)
Sodium: 138 mmol/L (ref 134–144)
Total Protein: 6.6 g/dL (ref 6.0–8.5)

## 2020-08-21 LAB — CBC WITH DIFFERENTIAL/PLATELET
Basophils Absolute: 0.1 10*3/uL (ref 0.0–0.2)
Basos: 1 %
EOS (ABSOLUTE): 0.1 10*3/uL (ref 0.0–0.4)
Eos: 2 %
Hematocrit: 42.6 % (ref 34.0–46.6)
Hemoglobin: 14.8 g/dL (ref 11.1–15.9)
Immature Grans (Abs): 0 10*3/uL (ref 0.0–0.1)
Immature Granulocytes: 0 %
Lymphocytes Absolute: 3.8 10*3/uL — ABNORMAL HIGH (ref 0.7–3.1)
Lymphs: 45 %
MCH: 33 pg (ref 26.6–33.0)
MCHC: 34.7 g/dL (ref 31.5–35.7)
MCV: 95 fL (ref 79–97)
Monocytes Absolute: 0.5 10*3/uL (ref 0.1–0.9)
Monocytes: 6 %
Neutrophils Absolute: 3.8 10*3/uL (ref 1.4–7.0)
Neutrophils: 46 %
Platelets: 301 10*3/uL (ref 150–450)
RBC: 4.48 x10E6/uL (ref 3.77–5.28)
RDW: 13.3 % (ref 11.7–15.4)
WBC: 8.4 10*3/uL (ref 3.4–10.8)

## 2020-08-21 LAB — VITAMIN D 25 HYDROXY (VIT D DEFICIENCY, FRACTURES): Vit D, 25-Hydroxy: 95.9 ng/mL (ref 30.0–100.0)

## 2020-08-25 LAB — STRATIFY JCV AB (W/ INDEX) W/ RFLX
Index Value: 0.19
Stratify JCV (TM) Ab w/Reflex Inhibition: NEGATIVE

## 2020-08-27 ENCOUNTER — Telehealth: Payer: Self-pay | Admitting: Family Medicine

## 2020-08-27 ENCOUNTER — Other Ambulatory Visit: Payer: Self-pay

## 2020-08-27 MED ORDER — OXYCODONE-ACETAMINOPHEN 5-325 MG PO TABS: ORAL_TABLET | ORAL | 0 refills | Status: DC

## 2020-08-27 NOTE — Telephone Encounter (Signed)
Pt is needing a refill on her oxyCODONE-acetaminophen (PERCOCET/ROXICET) 5-325 MG tablet sent in to the [facility] on [address].

## 2020-09-07 ENCOUNTER — Telehealth: Payer: Self-pay

## 2020-09-07 NOTE — Telephone Encounter (Signed)
I called [facility].  No answer, left a voicemail asking them to call us back regarding patient's Tysabri infusion.  I need to know if patient did show for her February appointment and when her next scheduled infusion is.  If [facility] calls back please obtain this information from them.

## 2020-09-10 ENCOUNTER — Other Ambulatory Visit: Payer: Self-pay | Admitting: *Deleted

## 2020-09-10 ENCOUNTER — Telehealth: Payer: Self-pay | Admitting: Family Medicine

## 2020-09-10 DIAGNOSIS — G35 Multiple sclerosis: Secondary | ICD-10-CM

## 2020-09-10 DIAGNOSIS — R5383 Other fatigue: Secondary | ICD-10-CM

## 2020-09-10 MED ORDER — ARMODAFINIL 200 MG PO TABS: 200.0000 mg | ORAL_TABLET | Freq: Every day | ORAL | 5 refills | Status: DC

## 2020-09-10 MED ORDER — MODAFINIL 200 MG PO TABS: ORAL_TABLET | ORAL | 2 refills | Status: DC

## 2020-09-10 NOTE — Telephone Encounter (Signed)
Modafinil refilled, Rx signed and faxed to pharmacy in [place], [place] as requested.

## 2020-09-10 NOTE — Telephone Encounter (Signed)
Pt has called for a refill on her modafinil (PROVIGIL) 200 MG tablet to [facility] [phone]

## 2020-09-10 NOTE — Addendum Note (Signed)
Addended by: [place] Pink on: 09/10/2020 10:43 AM   Modules accepted: Orders

## 2020-09-10 NOTE — Telephone Encounter (Signed)
We received a PA request for modafinil 200mg . We were unable to confirm insurance eligibility through covermymeds.   I called her plan to complete PA over the phone. [facility] at [phone]. I was informed that modafinil was not on her formulary this year. Armodafinil is preferred.  Per vo by Dr. [phone], okay to provide new prescription for armodafinil 200mg  daily.   This medication will also require a PA. This was initiated over the phone w/ [facility] (rep Epimenio Foot). Case ID: . Decision pending.  I called the patient and she is aware of the change. I also called [facility] [phone]) and spoke to [place]. He will void the modafinil prescription and go ahead and place the order for armodafinil 200mg  so that it will be in stock for the patient.

## 2020-09-10 NOTE — Addendum Note (Signed)
Addended by: Lilla Shook on: 09/10/2020 04:32 PM   Modules accepted: Orders

## 2020-09-14 NOTE — Telephone Encounter (Signed)
PA armodafinil denied. Submitted appeal letter to appeals department at [phone]. Received fax confirmation, waiting on determination.

## 2020-09-14 NOTE — Telephone Encounter (Signed)
Called pt and informed her about appeal denial. She will continue to use goodrx coupon to fill prescription.

## 2020-09-24 ENCOUNTER — Telehealth: Payer: Self-pay | Admitting: Family Medicine

## 2020-09-24 ENCOUNTER — Encounter: Payer: Self-pay | Admitting: *Deleted

## 2020-09-24 NOTE — Telephone Encounter (Signed)
Husband has called to inform in order for pt to continue to get infusions a new order needs to be faxed [facility](husband provided no fax # or point of contact)

## 2020-09-24 NOTE — Telephone Encounter (Signed)
Spoke with Violet, p# [phone] who stated last infusion was Dec 2021, no infusions scheduled. The order expired in Feb 2022. New order needs to be faxed to [phone]. [facility] Tysabri order form on NP's desk for completion, signature.

## 2020-09-24 NOTE — Telephone Encounter (Signed)
Tysabri orders signed, faxed to [facility], attention Violet. Received confirmation.

## 2020-09-25 ENCOUNTER — Other Ambulatory Visit: Payer: Self-pay | Admitting: Family Medicine

## 2020-09-25 NOTE — Telephone Encounter (Signed)
Pt request refill oxyCODONE-acetaminophen (PERCOCET/ROXICET) 5-325 MG tablet at [facility]

## 2020-09-28 MED ORDER — OXYCODONE-ACETAMINOPHEN 5-325 MG PO TABS: ORAL_TABLET | ORAL | 0 refills | Status: DC

## 2020-10-05 DIAGNOSIS — G35 Multiple sclerosis: Secondary | ICD-10-CM | POA: Diagnosis not present

## 2020-10-26 ENCOUNTER — Other Ambulatory Visit: Payer: Self-pay | Admitting: Emergency Medicine

## 2020-10-26 MED ORDER — OXYCODONE-ACETAMINOPHEN 5-325 MG PO TABS: ORAL_TABLET | ORAL | 0 refills | Status: DC

## 2020-10-26 NOTE — Addendum Note (Signed)
Addended by: Arther Abbott on: 10/26/2020 03:31 PM   Modules accepted: Orders

## 2020-10-26 NOTE — Telephone Encounter (Signed)
Is asking for oxycodone to be refilled.

## 2020-10-27 ENCOUNTER — Other Ambulatory Visit: Payer: Self-pay | Admitting: Family Medicine

## 2020-10-27 NOTE — Telephone Encounter (Signed)
Called [facility], [facility] they do have prescription.  Not filled there before by pharmacist, Alesia Banda.  She asked why pt is getting this, I relayed MS.   They did not have her insurance information.  I gave them what I had (2 policy's).  She appreciated this.  I tried to call pt , no answer.

## 2020-10-27 NOTE — Telephone Encounter (Signed)
Pt states the oxyCODONE-acetaminophen (PERCOCET/ROXICET) 5-325 MG tablet is not at the [facility] . Pt is asking that it either be called in again or it be called into [facility] on [address] in [place]

## 2020-10-28 MED ORDER — OXYCODONE-ACETAMINOPHEN 5-325 MG PO TABS: ORAL_TABLET | ORAL | 0 refills | Status: DC

## 2020-10-28 NOTE — Telephone Encounter (Signed)
[facility]codone was [facility] to [facility] but they were fine with that, however, [facility] does not have any oxycodone in stock. They need it sent to [facility] asap because she has not had any to take today.

## 2020-10-28 NOTE — Addendum Note (Signed)
Addended by: Guy Begin on: 10/28/2020 03:58 PM   Modules accepted: Orders

## 2020-11-17 ENCOUNTER — Telehealth: Payer: Self-pay | Admitting: *Deleted

## 2020-11-17 NOTE — Telephone Encounter (Signed)
Faxed completed/signed Tysabri pt status report and reauth questionnaire to [employer] at [phone]. Received confirmation.   Received fax notification that pt is re-authorized for Tysabri from 11/16/20-06/20/21. Patient enrollment number: [id number]. Account: [facility]. Site auth number: [id number]. [address] [place], [place] [address]. Phone: [phone]. Fax: [phone]

## 2020-11-26 ENCOUNTER — Telehealth: Payer: Self-pay | Admitting: Family Medicine

## 2020-11-26 MED ORDER — OXYCODONE-ACETAMINOPHEN 5-325 MG PO TABS: ORAL_TABLET | ORAL | 0 refills | Status: DC

## 2020-11-26 NOTE — Telephone Encounter (Signed)
[employer] Marcelino Duster) called, wanting to know patient's plan for her infusions. Patient is not returning calls. Would like a call from the nurse.  Contact info: [phone]

## 2020-11-26 NOTE — Telephone Encounter (Signed)
I called patient I relayed to her that received a call from [employer] the touch program they have been trying to reach her I gave her the number she did have a change in her Mobile They No Longer and I acted as stated that in the demographics.  And she will call them to update as well.  She was exposed to COVID so she did not receive I guess her infusion hornets on hold.  She has an appointment here with Dr. Epimenio Foot on July 14.  She needed refill on her oxycodone which will be sent to Amy for refill.  She appreciated call back.

## 2020-12-11 ENCOUNTER — Other Ambulatory Visit: Payer: Self-pay | Admitting: Family Medicine

## 2020-12-11 NOTE — Telephone Encounter (Signed)
Pt is asking if her  Armodafinil 200 MG TABS can be called into [facility].  If processed thru [facility] it would have cost pt hundreds of dollars.  Pt asking this be done because she is now 2 days without this medication.  Please call

## 2020-12-14 ENCOUNTER — Telehealth: Payer: Self-pay | Admitting: *Deleted

## 2020-12-14 ENCOUNTER — Emergency Department (HOSPITAL_COMMUNITY)
Admission: EM | Admit: 2020-12-14 | Discharge: 2020-12-14 | Disposition: A | Discharge status: Home / Self Care | Payer: Medicare Other | Attending: Emergency Medicine | Admitting: Emergency Medicine

## 2020-12-14 DIAGNOSIS — F17210 Nicotine dependence, cigarettes, uncomplicated: Secondary | ICD-10-CM | POA: Diagnosis not present

## 2020-12-14 DIAGNOSIS — L0201 Cutaneous abscess of face: Secondary | ICD-10-CM | POA: Insufficient documentation

## 2020-12-14 DIAGNOSIS — R5383 Other fatigue: Secondary | ICD-10-CM | POA: Diagnosis not present

## 2020-12-14 DIAGNOSIS — Z965 Presence of tooth-root and mandibular implants: Secondary | ICD-10-CM | POA: Insufficient documentation

## 2020-12-14 MED ORDER — DOXYCYCLINE HYCLATE 100 MG PO CAPS: 100.0000 mg | ORAL_CAPSULE | Freq: Two times a day (BID) | ORAL | 0 refills | Status: DC

## 2020-12-14 MED ORDER — LIDOCAINE-EPINEPHRINE (PF) 2 %-1:200000 IJ SOLN
INTRAMUSCULAR | Status: AC
  Filled 2020-12-14: qty 20

## 2020-12-14 MED ORDER — LIDOCAINE-EPINEPHRINE (PF) 2 %-1:200000 IJ SOLN: 20.0000 mL | Freq: Once | INTRAMUSCULAR | Status: DC

## 2020-12-14 MED ORDER — DOXYCYCLINE HYCLATE 100 MG PO TABS
100.0000 mg | ORAL_TABLET | Freq: Once | ORAL | Status: AC
  Administered 2020-12-14: 100 mg via ORAL
  Filled 2020-12-14: qty 1

## 2020-12-14 NOTE — ED Provider Notes (Signed)
Emergency Medicine Provider Triage Evaluation Note  Sabrina [name] , a 40 y.o. female  was evaluated in triage.  Pt complains of abscess. Had dental procedure done weeks ago. Was on clindamycin until yesterday. Not helping abscess. Abscess to right chin. Growing, painful, red. No drainage.  Review of Systems  Positive: Chin pain, abscess, night sweats  Negative: Fever, nausea, difficulty swallowing   Physical Exam  BP (!) 148/97 (BP Location: Left Arm)   Pulse 100   Temp 98 F (36.7 C) (Oral)   Resp 14   SpO2 100%  Gen:   Awake, no distress   Resp:  Normal effort  MSK:   Moves extremities without difficulty  Other:  Abscess to chin. Handling secretions without difficulty.   Medical Decision Making  Medically screening exam initiated at 10:59 AM.  Appropriate orders placed.  Sabrina [name] was informed that the remainder of the evaluation will be completed by another provider, this initial triage assessment does not replace that evaluation, and the importance of remaining in the ED until their evaluation is complete.     Sabrina Arista, PA-C 12/14/20 1101    Sabrina Barrette, MD 12/22/20 2125

## 2020-12-14 NOTE — Discharge Instructions (Signed)
Use warm compresses 2 times a day for the next 2-3 days.  It is normal to drain for a few days.  Take the first dose of antibiotics tonight.

## 2020-12-14 NOTE — ED Notes (Signed)
Pt has large abscess about the size of a half dollar on the right lower face and it is erythematous.

## 2020-12-14 NOTE — Telephone Encounter (Signed)
Submitted PA armodafinil on CMM. Key: [id number]. Waiting on determination from [facility].

## 2020-12-14 NOTE — Telephone Encounter (Signed)
I called.  She was in [place].  I spoke to her husband.  I relayed she picked up 30 tablets of armodafinil at the [facility] on 6/ 3/ 22 .  And she stated that they did.  But they want to change it to [facility] because of the cost.  So I will send that to Amy so [facility].

## 2020-12-14 NOTE — Telephone Encounter (Signed)
PA denied. Only covered for narcolepsy, Shift work disorder, OSA. Pt can use goodrx coupon. Faxed notice to pharmacy.

## 2020-12-14 NOTE — Telephone Encounter (Signed)
[place] drug registry checked last fill 12-11-20 #30 at [facility].  To costly will send to [facility] for future refills.

## 2020-12-14 NOTE — ED Triage Notes (Signed)
Pt arrived POV c/o lower left facial abscess that is getting more painful.  Pt states she has seen a provider and taken antibiotics and steroids without relief   Pt has nerve extraction 2wks ago, swelling and pain has gotten worse.

## 2020-12-14 NOTE — Addendum Note (Signed)
Addended by: Hermenia Fiscal S on: 12/14/2020 10:20 AM   Modules accepted: Orders

## 2020-12-14 NOTE — ED Provider Notes (Signed)
[facility] EMERGENCY DEPARTMENT Provider Note   CSN: [id number] Arrival date & time: 12/14/20  1018     History Chief Complaint  Patient presents with  . Abscess    Sabrina [name] is a 40 y.o. female.  The history is provided by the patient.  Abscess Location:  Face Facial abscess location:  Chin Size:  Golf ball Abscess quality: induration, painful, redness and warmth   Red streaking: no   Duration:  10 days Progression:  Worsening Pain details:    Quality:  Throbbing, tightness and pressure   Severity:  Moderate   Timing:  Constant   Progression:  Worsening Chronicity:  New Context comment:  Had dental implants a few weeks ago  Relieved by:  Nothing Worsened by:  Draining/squeezing and oral antibiotics (put on z-pack but no improvement) Associated symptoms: fatigue   Associated symptoms comment:  Chills Risk factors: no hx of MRSA and no prior abscess        Past Medical History:  Diagnosis Date  . Chronic pain syndrome   . Depression   . Fibromyalgia   . GAD (generalized anxiety disorder)   . Headache   . Multiple sclerosis (HCC) 10/28/2014  . Obsessive compulsive disorder   . Vision abnormalities     Patient Active Problem List   Diagnosis Date Noted  . High risk medication use 11/28/2019  . Leukocytosis 02/22/2017  . Hypokalemia 02/22/2017  . Multiple sclerosis exacerbation (HCC) 02/21/2017  . OCD (obsessive compulsive disorder) 02/21/2017  . Generalized anxiety disorder 02/21/2017  . Chronic pain 02/21/2017  . Fibromyalgia 02/21/2017  . Severe protein-calorie malnutrition (HCC) 02/21/2017  . Muscle spasticity 07/14/2015  . Obsessive-compulsive disorder 06/11/2015  . Left sided sciatica 06/11/2015  . Anxiety, generalized 01/27/2015  . Clinical depression 01/27/2015  . 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 Multiple sclerosis (HCC) 10/28/2014  . Other fatigue 10/28/2014  . Gait disorder 10/28/2014  . Lumbosacral radiculopathy at  S1 10/28/2014  . Cognitive dysfunction 10/28/2014  . Urinary frequency 10/28/2014  . Dysesthesia 10/28/2014  . Blurred vision 12/28/2011  . Leg weakness 12/28/2011  . Exacerbation of multiple sclerosis (HCC) 12/28/2011  . Leg paresthesia 12/28/2011  . Compulsive tobacco user syndrome 12/28/2011  . Current tobacco use 12/28/2011  . Burning sensation of feet 10/10/2011  . DS (disseminated sclerosis) (HCC) 09/20/2011  . Family history of diabetes mellitus type II 04/21/2011  . Decreased body weight 04/21/2011  . Abnormal weight loss 04/21/2011    Past Surgical History:  Procedure Laterality Date  . NO PAST SURGERIES       OB History   No obstetric history on file.     Family History  Problem Relation Age of Onset  . Healthy Mother   . Diabetes type II Father   . Prostate cancer Father     Social History   Tobacco Use  . Smoking status: Current Every Day Smoker    Packs/day: 0.50    Types: Cigarettes  . Smokeless tobacco: Never Used  Vaping Use  . Vaping Use: Never used  Substance Use Topics  . Alcohol use: No    Alcohol/week: 0.0 standard drinks  . Drug use: No    Home Medications Prior to Admission medications   Medication Sig Start Date End Date Taking? Authorizing Provider  Armodafinil 200 MG TABS Take 200 mg by mouth daily. 09/10/20   Sater, Pearletha Furl, MD  clonazePAM (KLONOPIN) 0.5 MG tablet Take 1 tablet (0.5 mg total) by mouth 3 (three) times  daily as needed. 07/29/20   Sater, Pearletha Furl, MD  DULoxetine (CYMBALTA) 60 MG capsule TAKE 1 CAPSULE(60 MG) BY MOUTH DAILY 04/01/20   Sater, Pearletha Furl, MD  feeding supplement, ENSURE ENLIVE, (ENSURE ENLIVE) LIQD Take 237 mLs by mouth 2 (two) times daily between meals. 02/23/17   Marguerita Merles Latif, DO  natalizumab (TYSABRI) 300 MG/15ML injection Inject 15 mLs (300 mg total) into the vein every 30 (thirty) days. 04/13/20   Sater, Pearletha Furl, MD  oxyCODONE-acetaminophen (PERCOCET/ROXICET) 5-325 MG tablet One po qid prn 11/26/20    Lomax, Amy, NP  ranitidine (ZANTAC) 150 MG tablet Take 150 mg by mouth 2 (two) times daily.    [provider]  tizanidine (ZANAFLEX) 2 MG capsule Take 1 capsule (2 mg total) by mouth 3 (three) times daily. 08/20/20   Lomax, Amy, NP  VITAMIN D PO Take 1,000 Units by mouth daily.    [provider]    Allergies    Amoxicillin, Penicillins, Sulfa antibiotics, and Codeine  Review of Systems   Review of Systems  Constitutional: Positive for fatigue.  All other systems reviewed and are negative.   Physical Exam Updated Vital Signs BP (!) 148/97 (BP Location: Left Arm)   Pulse 100   Temp 98 F (36.7 C) (Oral)   Resp 14   SpO2 100%   Physical Exam Vitals and nursing note reviewed.  Constitutional:      General: She is not in acute distress.    Appearance: She is well-developed.  HENT:     Head: Normocephalic and atraumatic.      Mouth/Throat:   Eyes:     Pupils: Pupils are equal, round, and reactive to light.  Cardiovascular:     Rate and Rhythm: Normal rate.  Pulmonary:     Effort: Pulmonary effort is normal. No respiratory distress.  Musculoskeletal:        General: No tenderness. Normal range of motion.     Comments: No edema  Skin:    General: Skin is warm and dry.     Findings: No rash.  Neurological:     Mental Status: She is alert and oriented to person, place, and time.     Cranial Nerves: No cranial nerve deficit.  Psychiatric:        Mood and Affect: Mood normal.        Behavior: Behavior normal.     ED Results / Procedures / Treatments   Labs (all labs ordered are listed, but only abnormal results are displayed) Labs Reviewed - No data to display  EKG None  Radiology No results found.  Procedures Procedures   INCISION AND DRAINAGE Performed by: Gwyneth Sprout Consent: Verbal consent obtained. Risks and benefits: risks, benefits and alternatives were discussed Type: abscess  Body area: right chin  Anesthesia: local  infiltration  Incision was made with a scalpel.  Local anesthetic: lidocaine 2% with epinephrine  Anesthetic total: 4 ml  Complexity: complex Blunt dissection to break up loculations  Drainage: purulent  Drainage amount: 11mL  Packing material: none  Patient tolerance: Patient tolerated the procedure well with no immediate complications.    Medications Ordered in ED Medications  lidocaine-EPINEPHrine (XYLOCAINE W/EPI) 2 %-1:200000 (PF) injection 20 mL (has no administration in time range)  lidocaine-EPINEPHrine (XYLOCAINE W/EPI) 2 %-1:200000 (PF) injection (has no administration in time range)  doxycycline (VIBRA-TABS) tablet 100 mg (has no administration in time range)    ED Course  I have reviewed the triage vital signs and  the nursing notes.  Pertinent labs & imaging results that were available during my care of the patient were reviewed by me and considered in my medical decision making (see chart for details).    MDM Rules/Calculators/A&P                          Patient presenting with a facial abscess on the right side of the chin with pointing.  I&D as above with significant purulent drainage.  Patient otherwise is well-appearing.  She had been on azithromycin which did not feel will cover cellulitis.  She was started on doxycycline.  Abscess was drained.  Patient stable for discharge. Final Clinical Impression(s) / ED Diagnoses Final diagnoses:  Facial abscess    Rx / DC Orders ED Discharge Orders         Ordered    doxycycline (VIBRAMYCIN) 100 MG capsule  2 times daily        12/14/20 1347           Gwyneth Sprout, MD 12/14/20 1347

## 2020-12-15 MED ORDER — ARMODAFINIL 200 MG PO TABS: 200.0000 mg | ORAL_TABLET | Freq: Every day | ORAL | 5 refills | Status: DC

## 2020-12-18 ENCOUNTER — Telehealth: Payer: Self-pay | Admitting: Family Medicine

## 2020-12-18 NOTE — Telephone Encounter (Signed)
Pt's husband, Nikyah Lackman (on [place]) called, She has missed a few appts. Want to know if she can start getting her infusions. Would like a call from the nurse.

## 2020-12-21 NOTE — Telephone Encounter (Signed)
Left message voicemail for Sabrina [name] over at [facility] this is in regards to her Tysabri infusion please give me a call back [phone] in regards to setting up an infusion for patient.

## 2020-12-21 NOTE — Telephone Encounter (Signed)
I called [facility]  [phone]. Spoke to Maralyn Sago,  last tysabri infusion was 10-05-20.  She stated they call and cancel then reschedule  (6 x) 11-03-20 , 11-12-20, 11-19-20, 12-08-20, 12-14-20.  Has appt 01-21-21 with Dr. Epimenio Foot.  I spoke to husband Leonette Most.  He states they are trying to get back on track.  Reasons for missing rescheduling infusions due to family having COVID, her having an dental abscess.  If ok to resume, let me know.

## 2020-12-22 NOTE — Telephone Encounter (Signed)
I called [facility].  Spoke to [place].  I relayed that per Amy nurse practitioner patient was okay to proceed and resume Tysabri.  I called patient and let her know . She did have their phone number to call them to get rescheduled.  She will keep follow-up appointment here with Dr. Epimenio Foot on 01/21/2021 at 3:00.  She stated she was feeling fine just a little swollen with the mouth implants but other than that she was okay she appreciated phone call back.

## 2020-12-24 ENCOUNTER — Other Ambulatory Visit: Payer: Self-pay | Admitting: Family Medicine

## 2020-12-24 MED ORDER — OXYCODONE-ACETAMINOPHEN 5-325 MG PO TABS: ORAL_TABLET | ORAL | 0 refills | Status: DC

## 2020-12-24 NOTE — Telephone Encounter (Signed)
Pt request refill oxyCODONE-acetaminophen (PERCOCET/ROXICET) 5-325 MG tablet at [facility] [phone]

## 2020-12-30 DIAGNOSIS — G35 Multiple sclerosis: Secondary | ICD-10-CM | POA: Diagnosis not present

## 2021-01-06 ENCOUNTER — Telehealth: Payer: Self-pay | Admitting: Family Medicine

## 2021-01-06 NOTE — Telephone Encounter (Signed)
I called [facility] and spoke to [facility] relating to pts last fill (12-11-2020 #30) armodafinil 200mg  daily.  She received this at another [facility] in [facility] and it is to soon to fill.  They did have the 12-15-20 prescription.   He stated that she can get 01-09-21.  I relayed to pt and she wanted to make sure that she could get on the weekend.  I relayed that I spoke to [facility] [place] at [place] and she should be able to get this on 01-09-21 and she verbalized understanding.

## 2021-01-06 NOTE — Telephone Encounter (Signed)
Pt's husband on [place] called requesting refill for Armodafinil 200 MG TABS. Pharmacy [facility] [phone].

## 2021-01-21 ENCOUNTER — Ambulatory Visit: Payer: Medicare Other | Admitting: Neurology

## 2021-01-21 ENCOUNTER — Telehealth: Payer: Self-pay | Admitting: Family Medicine

## 2021-01-21 ENCOUNTER — Other Ambulatory Visit: Payer: Self-pay | Admitting: Neurology

## 2021-01-21 MED ORDER — OXYCODONE-ACETAMINOPHEN 5-325 MG PO TABS: ORAL_TABLET | ORAL | 0 refills | Status: DC

## 2021-01-21 NOTE — Telephone Encounter (Signed)
I have routed this request to Dr Dohmeier for review. The pt is due for the medication and [place] registry was verified.  

## 2021-01-21 NOTE — Telephone Encounter (Signed)
Correction, sent to Dr Epimenio Foot to review and sign

## 2021-01-21 NOTE — Telephone Encounter (Signed)
Pt is requesting a refill for  oxyCODONE-acetaminophen (PERCOCET/ROXICET) 5-325 MG tablet.  Pharmacy: [facility] #[id number]   

## 2021-01-29 ENCOUNTER — Other Ambulatory Visit: Payer: Self-pay | Admitting: Neurology

## 2021-01-29 MED ORDER — CLONAZEPAM 0.5 MG PO TABS: 0.5000 mg | ORAL_TABLET | Freq: Three times a day (TID) | ORAL | 0 refills | Status: DC | PRN

## 2021-01-29 NOTE — Telephone Encounter (Signed)
Patient paged, I gave her one month prescription for clonazepam.

## 2021-02-01 ENCOUNTER — Other Ambulatory Visit: Payer: Self-pay | Admitting: Family Medicine

## 2021-02-01 MED ORDER — CLONAZEPAM 0.5 MG PO TABS: 0.5000 mg | ORAL_TABLET | Freq: Three times a day (TID) | ORAL | 5 refills | Status: DC | PRN

## 2021-02-01 NOTE — Telephone Encounter (Signed)
Pt has called for a refill on her clonazePAM (KLONOPIN) 0.5 MG tablet to [facility]

## 2021-02-10 DIAGNOSIS — G35 Multiple sclerosis: Secondary | ICD-10-CM | POA: Diagnosis not present

## 2021-02-24 ENCOUNTER — Other Ambulatory Visit: Payer: Self-pay | Admitting: Family Medicine

## 2021-02-24 MED ORDER — OXYCODONE-ACETAMINOPHEN 5-325 MG PO TABS: ORAL_TABLET | ORAL | 0 refills | Status: DC

## 2021-02-24 NOTE — Addendum Note (Signed)
Addended by: Arther Abbott on: 02/24/2021 11:44 AM   Modules accepted: Orders

## 2021-02-24 NOTE — Telephone Encounter (Signed)
Pt called requesting refill for oxyCODONE-acetaminophen (PERCOCET/ROXICET) 5-325 MG tablet. Pharmacy [facility] [phone].

## 2021-03-22 ENCOUNTER — Telehealth: Payer: Self-pay | Admitting: Family Medicine

## 2021-03-22 MED ORDER — DULOXETINE HCL 60 MG PO CPEP: ORAL_CAPSULE | ORAL | 3 refills | Status: DC

## 2021-03-22 NOTE — Telephone Encounter (Signed)
Refills sent to requested pharmacy. 

## 2021-03-22 NOTE — Telephone Encounter (Signed)
Pt is requesting a refill for DULoxetine (CYMBALTA) 60 MG capsule.  Pharmacy: [facility] [phone]

## 2021-03-24 ENCOUNTER — Other Ambulatory Visit: Payer: Self-pay | Admitting: Family Medicine

## 2021-03-24 MED ORDER — OXYCODONE-ACETAMINOPHEN 5-325 MG PO TABS: ORAL_TABLET | ORAL | 0 refills | Status: DC

## 2021-03-24 NOTE — Telephone Encounter (Signed)
Pt called requesting refill for oxyCODONE-acetaminophen (PERCOCET/ROXICET) 5-325 MG tablet. Pharmacy [phone]. 

## 2021-03-24 NOTE — Telephone Encounter (Signed)
Received refill request for oxycodone-acetaminophen.  Last OV was on 08/20/20.  Next OV is scheduled for 04/20/21 .  Last RX was written on 02/24/21 for 120 tabs.   [place] Drug Database has been reviewed.

## 2021-03-27 ENCOUNTER — Other Ambulatory Visit: Payer: Self-pay | Admitting: Neurology

## 2021-04-20 ENCOUNTER — Other Ambulatory Visit: Payer: Self-pay

## 2021-04-20 ENCOUNTER — Ambulatory Visit (INDEPENDENT_AMBULATORY_CARE_PROVIDER_SITE_OTHER): Payer: Medicare Other | Admitting: Neurology

## 2021-04-20 ENCOUNTER — Encounter: Payer: Self-pay | Admitting: Neurology

## 2021-04-20 ENCOUNTER — Telehealth: Payer: Self-pay | Admitting: Neurology

## 2021-04-20 VITALS — BP 147/88 | HR 88 | Ht 64.0 in | Wt 91.5 lb

## 2021-04-20 DIAGNOSIS — G35 Multiple sclerosis: Secondary | ICD-10-CM

## 2021-04-20 DIAGNOSIS — Z79899 Other long term (current) drug therapy: Secondary | ICD-10-CM

## 2021-04-20 MED ORDER — OXYCODONE-ACETAMINOPHEN 5-325 MG PO TABS: ORAL_TABLET | ORAL | 0 refills | Status: DC

## 2021-04-20 MED ORDER — NALOXONE HCL 0.4 MG/ML IJ SOLN: INTRAMUSCULAR | 1 refills | Status: DC

## 2021-04-20 NOTE — Progress Notes (Signed)
u  [facility]  PATIENT: Barney Overbaugh DOB: 06-21-1981    _________________________________   HISTORICAL  CHIEF COMPLAINT:  Chief Complaint  Patient presents with   Follow-up    Rm 1, w mother. Here for MS f/u, on Tysabri. Last infusion: In early August Next infusion date: 04/23/21. Pt reports no changes since last OV. PN mainly in L hip. Hurts to sit. Pt would like a prescription/order to help get Depends approved by insurance.     HISTORY OF PRESENT ILLNESS:  Lavena Loretto is a 40 y.o. woman with multiple sclerosis and chronic pain.     Update 04/20/2021: She is on Tysabri for RRMS.   She tolerates it well.   She has no recent exacerbation.  She has been JCV antibody negative.  Her last test was 11/28/2019 and was negative (0.13)  She is walking about the same.  She notes reduced balance.  She needs to hold the bannister on stairs.  No falls recently.  Leg strength is unchanged - a little weaker on her left.   She has some numbness and tingling in her left leg.  She has urinary urgency and also has some incomplete emptying. .She has sone urge incontinence.    She has some hesitancy as well as urgency.  She uses Depends at night.      She has fatigue and somnolence, helped by modafinil.  And she tolerates it well.   She sleeps well at niht and does not nap.    She has persistent buttock and leg pain     Riding in a car is difficult .  She needs to sit on a cushion.     njections (piriformis) did not help. Botox into the piriformis muscle only helped a week.     She is on Percocet 5 mg qid but feels it is not helping enough.   She is also on lamotrigine, cyclobenzaprine (takes prn), clonazepam 0.5 once a day and occassionally at night.   The PDMP was reviewed and she is not getting med's from other sources and does not have drug seeking behavior.     She has gained two more pounds (91 pounds)   She is off Megace and feels her appetite improved on its own.        MS History:  She had right optic neuritis in December 2012 and had an MRI of the brain showing optic nerve inflammation and many white matter spots.    She saw Dr. Macon Large in [place].   She was started on Copaxone.  She likely had an exacerbation in 2013 when she had trouble walking x 2 months.   She received a few days of IV Solu-Medrol.  Last year, she also received a few days of IV Solu-Medrol but she is uncertain   She felt she did well for the first 2 years but switched to Gilenya because she was tired of the shots last year.   At first, she tolerated it well but then her depression got much worse and she had some stomach issues so she returned to Copaxone last month (20 mg daily).   MRI early 2016 showed multiple old MS plaques and 1  Enhancing focus in the right frontal lobe prompting change to Tysabi.   She has been on Tysabri since 2016.  MRI Brain/spine 02/2017:  MRI of the brain and MRI of the thoracic spine performed during her recent hospitalization. The MRI of the brain shows many T2/FLAIR hyperintense foci in  the periventricular, juxtacortical and deep white matter. Her to be any change when compared to her previous MRI. The MRI of the thoracic spine shows multiple T2 hyperintense lesions. The T6-T7 lesion is fairly large and there could be slight expansion of the spinal cord. However, there is no enhancement.          REVIEW OF SYSTEMS: Constitutional: No fevers, chills, sweats, or change in appetite.  Notes a lot of fatigue Eyes: No visual changes, double vision, eye pain Ear, nose and throat: No hearing loss, ear pain, nasal congestion, sore throat Cardiovascular: No chest pain, palpitations Respiratory:  No shortness of breath at rest or with exertion.   No wheezes GastrointestinaI: No nausea, vomiting, diarrhea, abdominal pain, fecal incontinence Genitourinary:  as above. Musculoskeletal:  No neck pain, back pain Integumentary: No rash, pruritus, skin lesions Neurological: as  above Psychiatric: Notes some depression and anxiety Endocrine: No palpitations, diaphoresis, change in appetite, change in weigh or increased thirst Hematologic/Lymphatic:  No anemia, purpura, petechiae. Allergic/Immunologic: No itchy/runny eyes, nasal congestion, recent allergic reactions, rashes  ALLERGIES: Allergies  Allergen Reactions   Amoxicillin Anaphylaxis   Penicillins Anaphylaxis   Sulfa Antibiotics Other (See Comments)    unknown   Codeine Nausea And Vomiting    HOME MEDICATIONS:  Current Outpatient Medications:    Armodafinil 200 MG TABS, Take 200 mg by mouth daily., Disp: 30 tablet, Rfl: 5   clonazePAM (KLONOPIN) 0.5 MG tablet, Take 1 tablet (0.5 mg total) by mouth 3 (three) times daily as needed., Disp: 60 tablet, Rfl: 5   doxycycline (VIBRAMYCIN) 100 MG capsule, Take 1 capsule (100 mg total) by mouth 2 (two) times daily., Disp: 14 capsule, Rfl: 0   DULoxetine (CYMBALTA) 60 MG capsule, TAKE 1 CAPSULE(60 MG) BY MOUTH DAILY, Disp: 90 capsule, Rfl: 3   feeding supplement, ENSURE ENLIVE, (ENSURE ENLIVE) LIQD, Take 237 mLs by mouth 2 (two) times daily between meals., Disp: 237 mL, Rfl: 12   natalizumab (TYSABRI) 300 MG/15ML injection, Inject 15 mLs (300 mg total) into the vein every 30 (thirty) days., Disp: 15 mL, Rfl: 11   oxyCODONE-acetaminophen (PERCOCET/ROXICET) 5-325 MG tablet, One po qid prn, Disp: 120 tablet, Rfl: 0   ranitidine (ZANTAC) 150 MG tablet, Take 150 mg by mouth 2 (two) times daily., Disp: , Rfl:    tizanidine (ZANAFLEX) 2 MG capsule, Take 1 capsule (2 mg total) by mouth 3 (three) times daily., Disp: 90 capsule, Rfl: 11   VITAMIN D PO, Take 1,000 Units by mouth daily., Disp: , Rfl:   PAST MEDICAL HISTORY: Past Medical History:  Diagnosis Date   Chronic pain syndrome    Depression    Fibromyalgia    GAD (generalized anxiety disorder)    Headache    Multiple sclerosis (HCC) 10/28/2014   Obsessive compulsive disorder    Vision abnormalities     PAST  SURGICAL HISTORY: Past Surgical History:  Procedure Laterality Date   NO PAST SURGERIES      FAMILY HISTORY: Family History  Problem Relation Age of Onset   Healthy Mother    Diabetes type II Father    Prostate cancer Father     SOCIAL HISTORY:  Social History   Socioeconomic History   Marital status: Married    Spouse name: Not on file   Number of children: Not on file   Years of education: Not on file   Highest education level: Not on file  Occupational History   Not on file  Tobacco Use  Smoking status: Every Day    Packs/day: 0.50    Types: Cigarettes   Smokeless tobacco: Never  Vaping Use   Vaping Use: Never used  Substance and Sexual Activity   Alcohol use: No    Alcohol/week: 0.0 standard drinks   Drug use: No   Sexual activity: Not on file  Other Topics Concern   Not on file  Social History Narrative   Not on file   Social Determinants of Health   Financial Resource Strain: Not on file  Food Insecurity: Not on file  Transportation Needs: Not on file  Physical Activity: Not on file  Stress: Not on file  Social Connections: Not on file  Intimate Partner Violence: Not on file     PHYSICAL EXAM  Vitals:   04/20/21 1115  BP: (!) 147/88  Pulse: 88  Weight: 91 lb 8 oz (41.5 kg)  Height: 5\' 4"  (1.626 m)    Body mass index is 15.71 kg/m.   General: The patient is well-developed and well-nourished and in no acute distress  Musculoskeletal:   She has moderate tenderness over the piriformis muscle on the left hip and to a lesser extent over the trochanteric bursa.  Neurologic Exam  Mental status:  The patient is alert and oriented x 3 at the time of the examination. The patient has apparent normal recent and remote memory, with good attention span and concentration ability and normal speech today  Cranial nerves: Extraocular movements are full.  Facial strength and sensation was normal.  Trapezius strength was normal.  Hearing was normal and  symmetric.  Motor:  Muscle bulk is normal.   Muscle tone is mildly increased in legs. Strength is 5/5.   Sensory: She has normal sensation in her arms.  She has reduced vibration sensation in the toes.  Coordination: Cerebellar testing reveals good finger-nose-finger bilaterally.  She has mildly reduced heel-to-shin bilaterally.  Gait and station: Station is normal.  Gait is mildly wide and tandem gait is moderately wide.. Romberg is negative.  Reflexes: Deep tendon reflexes are normal in the arms increased bilaterally with nonsustained clonus at the ankles      ASSESSMENT AND PLAN  No diagnosis found.   1.  Continue Tysabri 300 mg every 4 weeks.  We will check JCV antibody and CBC with differential today.  She will continue Tysabri infusions in [place].    2.  Continue clonazepam for anxiety and spasms, oxycodone for pain and modafinil for fatigue and sleepiness. The PDMP was reviewed and she is compliant with medications and not getting prescriptions from other providers.  She is not showing drug-seeking behavior. 3. Stay active and exercise as tolerated. 4.   rtc 4 months, or sooner if new or worsening neurologic symptoms  Tyjae Issa A. Berolle, MD, PhD 04/20/2021, 11:30 AM Certified in Neurology, Clinical Neurophysiology, Sleep Medicine, Pain Medicine and Neuroimaging  [facility] [address] [place], [place] [id number]

## 2021-04-20 NOTE — Telephone Encounter (Signed)
Placed JCV lab in [facility] lock box for routine lab pick up. Results pending. 

## 2021-04-21 LAB — CBC WITH DIFFERENTIAL/PLATELET
Basophils Absolute: 0 10*3/uL (ref 0.0–0.2)
Basos: 1 %
EOS (ABSOLUTE): 0 10*3/uL (ref 0.0–0.4)
Eos: 0 %
Hematocrit: 41.8 % (ref 34.0–46.6)
Hemoglobin: 14.7 g/dL (ref 11.1–15.9)
Immature Grans (Abs): 0 10*3/uL (ref 0.0–0.1)
Immature Granulocytes: 0 %
Lymphocytes Absolute: 2.6 10*3/uL (ref 0.7–3.1)
Lymphs: 36 %
MCH: 32.5 pg (ref 26.6–33.0)
MCHC: 35.2 g/dL (ref 31.5–35.7)
MCV: 93 fL (ref 79–97)
Monocytes Absolute: 0.4 10*3/uL (ref 0.1–0.9)
Monocytes: 6 %
Neutrophils Absolute: 4.2 10*3/uL (ref 1.4–7.0)
Neutrophils: 57 %
Platelets: 287 10*3/uL (ref 150–450)
RBC: 4.52 x10E6/uL (ref 3.77–5.28)
RDW: 11.9 % (ref 11.7–15.4)
WBC: 7.3 10*3/uL (ref 3.4–10.8)

## 2021-04-29 DIAGNOSIS — G35 Multiple sclerosis: Secondary | ICD-10-CM | POA: Diagnosis not present

## 2021-04-29 NOTE — Telephone Encounter (Signed)
JCV results  Index value: 0.22 JCV antibody: indeterminate  Final result: Negative

## 2021-05-17 ENCOUNTER — Telehealth: Payer: Self-pay

## 2021-05-17 NOTE — Telephone Encounter (Signed)
Received fax notification that pt is re-authorized for Tysabri from 05/17/21-12/20/21.  Patient enrollment number: [id number].  Account: [facility]  Site auth number: [id number]. [address] [place], [place] [address].  Phone: [phone]. Fax: [phone]

## 2021-05-24 ENCOUNTER — Other Ambulatory Visit: Payer: Self-pay | Admitting: Neurology

## 2021-05-24 ENCOUNTER — Telehealth: Payer: Self-pay | Admitting: Neurology

## 2021-05-24 MED ORDER — OXYCODONE-ACETAMINOPHEN 5-325 MG PO TABS: ORAL_TABLET | ORAL | 0 refills | Status: DC

## 2021-05-24 NOTE — Telephone Encounter (Signed)
Pt's husband Leonette Most called to requesting refill for his wife's oxyCODONE-acetaminophen (PERCOCET/ROXICET) 5-325 MG tablet. Pharmacy [facility] [phone] - [place], [place] - [address] AT [facility] [address].

## 2021-05-24 NOTE — Telephone Encounter (Signed)
Refill request pending MD approval

## 2021-05-24 NOTE — Telephone Encounter (Signed)
Pt request refill foroxyCODONE-acetaminophen (PERCOCET/ROXICET) 5-325 MG tablet  at [facility]

## 2021-06-02 ENCOUNTER — Telehealth: Payer: Self-pay

## 2021-06-02 NOTE — Telephone Encounter (Signed)
I called [facility] [place] to find out when patient's next and last tysabri infusions are/were scheduled.  No answer, left a message asking them to call us back with this information. When they call back, please obtain these dates.

## 2021-06-08 ENCOUNTER — Telehealth: Payer: Self-pay | Admitting: Neurology

## 2021-06-08 NOTE — Telephone Encounter (Signed)
Attempted PA for the pt on CMM/ [employer] KEY: [id number] Will await for determination.

## 2021-06-08 NOTE — Telephone Encounter (Signed)
PA denied. Pt has previously been using good rx to get the medication and may continue to do so

## 2021-06-21 ENCOUNTER — Other Ambulatory Visit: Payer: Self-pay | Admitting: Neurology

## 2021-06-21 MED ORDER — OXYCODONE-ACETAMINOPHEN 5-325 MG PO TABS: ORAL_TABLET | ORAL | 0 refills | Status: DC

## 2021-06-21 NOTE — Telephone Encounter (Signed)
Pt is requesting a refill for  oxyCODONE-acetaminophen (PERCOCET/ROXICET) 5-325 MG tablet.  Pharmacy: [facility] [phone]

## 2021-06-22 ENCOUNTER — Other Ambulatory Visit: Payer: Self-pay | Admitting: Neurology

## 2021-06-22 MED ORDER — OXYCODONE-ACETAMINOPHEN 5-325 MG PO TABS: ORAL_TABLET | ORAL | 0 refills | Status: DC

## 2021-06-22 NOTE — Telephone Encounter (Signed)
Reviewed pt chart. Rx sent to [facility] 06/21/21, not [facility]. I called [facility] and cx rx sent in. Sent to MD again to escribe to [facility] instead.

## 2021-06-22 NOTE — Telephone Encounter (Signed)
Pt's husband requesting refill for oxyCODONE-acetaminophen (PERCOCET/ROXICET) 5-325 MG tablet. Pharmacy [facility] [phone] - [place], [place] - [address] AT [facility] [address].

## 2021-07-06 ENCOUNTER — Other Ambulatory Visit: Payer: Self-pay | Admitting: Neurology

## 2021-07-06 ENCOUNTER — Telehealth: Payer: Self-pay | Admitting: Neurology

## 2021-07-06 MED ORDER — ARMODAFINIL 200 MG PO TABS: 200.0000 mg | ORAL_TABLET | Freq: Every day | ORAL | 5 refills | Status: DC

## 2021-07-06 NOTE — Telephone Encounter (Signed)
Pt is needing a refill request for her Armodafinil 200 MG TABS sent in to the [facility] [place] [facility] in [place]

## 2021-07-06 NOTE — Telephone Encounter (Signed)
I have routed this request to Dr Sater for review. The pt is due for the medication and [place] registry was verified.  

## 2021-07-16 DIAGNOSIS — G35 Multiple sclerosis: Secondary | ICD-10-CM | POA: Diagnosis not present

## 2021-07-20 ENCOUNTER — Other Ambulatory Visit: Payer: Self-pay | Admitting: Neurology

## 2021-07-20 MED ORDER — OXYCODONE-ACETAMINOPHEN 5-325 MG PO TABS: ORAL_TABLET | ORAL | 0 refills | Status: DC

## 2021-07-20 NOTE — Telephone Encounter (Signed)
Pt request refill foroxyCODONE-acetaminophen (PERCOCET/ROXICET) 5-325 MG tablet at [facility] #[id number] 

## 2021-08-04 ENCOUNTER — Other Ambulatory Visit: Payer: Self-pay | Admitting: Neurology

## 2021-08-04 MED ORDER — CLONAZEPAM 0.5 MG PO TABS: 0.5000 mg | ORAL_TABLET | Freq: Three times a day (TID) | ORAL | 5 refills | Status: DC | PRN

## 2021-08-04 NOTE — Telephone Encounter (Signed)
Pt's husband is asking for a refill on pt's clonazePAM (KLONOPIN) 0.5 MG tablet to  [facility] [phone]

## 2021-08-18 ENCOUNTER — Telehealth: Payer: Self-pay

## 2021-08-18 NOTE — Telephone Encounter (Signed)
I called [facility] [place] to find out when patient's next and last tysabri infusions are/were scheduled.  No answer, left a message asking them to call us back with this information. When they call back, please obtain these dates. 

## 2021-08-19 ENCOUNTER — Other Ambulatory Visit: Payer: Self-pay | Admitting: Neurology

## 2021-08-19 MED ORDER — OXYCODONE-ACETAMINOPHEN 5-325 MG PO TABS: ORAL_TABLET | ORAL | 0 refills | Status: DC

## 2021-08-19 NOTE — Telephone Encounter (Signed)
Pt request refill foroxyCODONE-acetaminophen (PERCOCET/ROXICET) 5-325 MG tablet at [facility] #[id number] 

## 2021-08-19 NOTE — Telephone Encounter (Signed)
Sabrina [name]  called back and said last was 07-16-21 pt was supposed to come on 02-03 but did not.

## 2021-09-01 ENCOUNTER — Encounter: Payer: Self-pay | Admitting: Family Medicine

## 2021-09-01 ENCOUNTER — Ambulatory Visit (INDEPENDENT_AMBULATORY_CARE_PROVIDER_SITE_OTHER): Payer: Medicare Other | Admitting: Family Medicine

## 2021-09-01 VITALS — BP 165/88 | HR 80 | Ht 64.0 in | Wt 93.0 lb

## 2021-09-01 DIAGNOSIS — R5383 Other fatigue: Secondary | ICD-10-CM

## 2021-09-01 DIAGNOSIS — F09 Unspecified mental disorder due to known physiological condition: Secondary | ICD-10-CM

## 2021-09-01 DIAGNOSIS — Z79899 Other long term (current) drug therapy: Secondary | ICD-10-CM

## 2021-09-01 DIAGNOSIS — F32A Depression, unspecified: Secondary | ICD-10-CM | POA: Diagnosis not present

## 2021-09-01 DIAGNOSIS — G35 Multiple sclerosis: Secondary | ICD-10-CM | POA: Diagnosis not present

## 2021-09-01 DIAGNOSIS — M62838 Other muscle spasm: Secondary | ICD-10-CM | POA: Diagnosis not present

## 2021-09-01 DIAGNOSIS — G8929 Other chronic pain: Secondary | ICD-10-CM | POA: Diagnosis not present

## 2021-09-01 MED ORDER — TIZANIDINE HCL 2 MG PO CAPS: 2.0000 mg | ORAL_CAPSULE | Freq: Three times a day (TID) | ORAL | 11 refills | Status: DC

## 2021-09-01 NOTE — Patient Instructions (Addendum)
Below is our plan:  We will continue current treatment plan. I will restart tizanidine. Take 2mg  every 8 hours as needed for low back pain. Do not take with other sedating (pain meds). I will update labs, today. Please keep scheduled infusion appt next week.   Please make sure you are staying well hydrated. I recommend 50-60 ounces daily. Well balanced diet and regular exercise encouraged. Consistent sleep schedule with 6-8 hours recommended.   Please continue follow up with care team as directed.   Follow up with Dr Felecia Shelling in 6 months.  You may receive a survey regarding today's visit. I encourage you to leave honest feed back as I do use this information to improve patient care. Thank you for seeing me today!

## 2021-09-01 NOTE — Progress Notes (Signed)
Chief Complaint  Patient presents with   Follow-up    Rm 2, pt presents for MS follow up. She is currently treated with Tysabri and received these infusions in [facility] [place]. Last infusion was 07/16/2021 and she had missed the Feb infusion. She has rescheduled her feb apt for next week.     HISTORY OF PRESENT ILLNESS: 09/01/21 ALL:  Sabrina [name] is a 41 y.o. female here today for follow up for RRMS. She continues Tysabri infusions. Labs have been stable. Last infusion on 07/16/2021. February infusion was missed but has been rescheduled for next week. MRI in 03/2020 showed stable MS and progressing cerebral atrophy.   She feels that MS symptoms are about the same. She denies new or exacerbating symptoms. No falls. Gait is stable. She uses Rolator for longer distances (greater than 10 minutes). She continues to have low back, left leg and buttock pain. Oxycodone 5/325mg  QID helps a little. She reports that she never received tizanidine. She is no longer taking cyclobenzaprine. It made her too groggy.   Mood is stable. She continues duloxetine 60mg  daily and clonazepam 0.5mg  up to TID (usually BID) helps with mood, sleep, and pain. Memory is stable. No significant changes. She is sleeping "great." She sleeps for about 7-8 hours a night. Appetite is ok. Weight is stable. She has gained three pounds since 04/2021. She continues to drink 05/2021 (sometimes adds ice cream) 1-2 times daily.   She feels fatigue is about the same. She continues armodafinil 200mg  daily. She feels that she would not be able to function without it. She tries to stay active around the home. She does have have an exercise regimen.   She feels bladder and bowel habits are stable, no significant changes. She did not wish to take oxybutynin.    HISTORY (copied from Dr [employer] previous note)  Sabrina [name] is a 41 y.o. woman with multiple sclerosis and chronic pain.      Update  04/20/2021: She is on Tysabri for RRMS.   She tolerates it well.   She has no recent exacerbation.  She has been JCV antibody negative.  Her last test was 11/28/2019 and was negative (0.13)   She is walking about the same.  She notes reduced balance.  She needs to hold the bannister on stairs.  No falls recently.  Leg strength is unchanged - a little weaker on her left.   She has some numbness and tingling in her left leg.  She has urinary urgency and also has some incomplete emptying. .She has sone urge incontinence.    She has some hesitancy as well as urgency.  She uses Depends at night.       She has fatigue and somnolence, helped by modafinil.  And she tolerates it well.   She sleeps well at niht and does not nap.     She has persistent buttock and leg pain     Riding in a car is difficult .  She needs to sit on a cushion.     njections (piriformis) did not help. Botox into the piriformis muscle only helped a week.      She is on Percocet 5 mg qid but feels it is not helping enough.   She is also on lamotrigine, cyclobenzaprine (takes prn), clonazepam 0.5 once a day and occassionally at night.   The PDMP was reviewed and she is not getting med's from other sources and does not have drug  seeking behavior.      She has gained two more pounds (91 pounds)   She is off Megace and feels her appetite improved on its own.      MS History:  She had right optic neuritis in December 2012 and had an MRI of the brain showing optic nerve inflammation and many white matter spots.    She saw Dr. Macon Large in [place].   She was started on Copaxone.  She likely had an exacerbation in 2013 when she had trouble walking x 2 months.   She received a few days of IV Solu-Medrol.  Last year, she also received a few days of IV Solu-Medrol but she is uncertain   She felt she did well for the first 2 years but switched to Gilenya because she was tired of the shots last year.   At first, she tolerated it well but then her  depression got much worse and she had some stomach issues so she returned to Copaxone last month (20 mg daily).   MRI early 2016 showed multiple old MS plaques and 1  Enhancing focus in the right frontal lobe prompting change to Tysabi.  She has been on Tysabri since 2016.   MRI Brain/spine 02/2017:  MRI of the brain and MRI of the thoracic spine performed during her recent hospitalization. The MRI of the brain shows many T2/FLAIR hyperintense foci in the periventricular, juxtacortical and deep white matter. Her to be any change when compared to her previous MRI. The MRI of the thoracic spine shows multiple T2 hyperintense lesions. The T6-T7 lesion is fairly large and there could be slight expansion of the spinal cord. However, there is no enhancement.      REVIEW OF SYSTEMS: Out of a complete 14 system review of symptoms, the patient complains only of the following symptoms, chronic pain, decreased appetite, discoloration of posterior legs and all other reviewed systems are negative.    ALLERGIES: Allergies  Allergen Reactions   Amoxicillin Anaphylaxis   Penicillins Anaphylaxis   Sulfa Antibiotics Other (See Comments)    unknown   Codeine Nausea And Vomiting     HOME MEDICATIONS: Outpatient Medications Prior to Visit  Medication Sig Dispense Refill   Armodafinil 200 MG TABS Take 200 mg by mouth daily. 30 tablet 5   clonazePAM (KLONOPIN) 0.5 MG tablet Take 1 tablet (0.5 mg total) by mouth 3 (three) times daily as needed. 60 tablet 5   DULoxetine (CYMBALTA) 60 MG capsule TAKE 1 CAPSULE(60 MG) BY MOUTH DAILY 90 capsule 3   feeding supplement, ENSURE ENLIVE, (ENSURE ENLIVE) LIQD Take 237 mLs by mouth 2 (two) times daily between meals. 237 mL 12   naloxone (NARCAN) 0.4 MG/ML injection Use as directed 1 mL 1   natalizumab (TYSABRI) 300 MG/15ML injection Inject 15 mLs (300 mg total) into the vein every 30 (thirty) days. 15 mL 11   oxyCODONE-acetaminophen (PERCOCET/ROXICET) 5-325 MG tablet One po  qid prn 120 tablet 0   ranitidine (ZANTAC) 150 MG tablet Take 150 mg by mouth 2 (two) times daily.     VITAMIN D PO Take 1,000 Units by mouth daily.     doxycycline (VIBRAMYCIN) 100 MG capsule Take 1 capsule (100 mg total) by mouth 2 (two) times daily. 14 capsule 0   tizanidine (ZANAFLEX) 2 MG capsule Take 1 capsule (2 mg total) by mouth 3 (three) times daily. 90 capsule 11   No facility-administered medications prior to visit.     PAST MEDICAL HISTORY: Past  Medical History:  Diagnosis Date   Chronic pain syndrome    Depression    Fibromyalgia    GAD (generalized anxiety disorder)    Headache    Multiple sclerosis (HCC) 10/28/2014   Obsessive compulsive disorder    Vision abnormalities      PAST SURGICAL HISTORY: Past Surgical History:  Procedure Laterality Date   NO PAST SURGERIES       FAMILY HISTORY: Family History  Problem Relation Age of Onset   Healthy Mother    Diabetes type II Father    Prostate cancer Father      SOCIAL HISTORY: Social History   Socioeconomic History   Marital status: Married    Spouse name: Not on file   Number of children: Not on file   Years of education: Not on file   Highest education level: Not on file  Occupational History   Not on file  Tobacco Use   Smoking status: Every Day    Packs/day: 0.50    Types: Cigarettes   Smokeless tobacco: Never  Vaping Use   Vaping Use: Never used  Substance and Sexual Activity   Alcohol use: No    Alcohol/week: 0.0 standard drinks   Drug use: No   Sexual activity: Not on file  Other Topics Concern   Not on file  Social History Narrative   Not on file   Social Determinants of Health   Financial Resource Strain: Not on file  Food Insecurity: Not on file  Transportation Needs: Not on file  Physical Activity: Not on file  Stress: Not on file  Social Connections: Not on file  Intimate Partner Violence: Not on file      PHYSICAL EXAM  Vitals:   09/01/21 1436  BP: (!) 165/88   Pulse: 80  Weight: 93 lb (42.2 kg)  Height:  (1.626 m)    Body mass index is 15.96 kg/m.   Generalized: thin, in no acute distress  Cardiology: normal rate and rhythm, no murmur auscultated  Respiratory: clear to auscultation bilaterally    Neurological examination  Mentation: Alert oriented to time, place, history taking. Follows all commands speech and language fluent Cranial nerve II-XII: Pupils were equal round reactive to light. Extraocular movements were full, visual field were full on confrontational test. Facial sensation and strength were normal. Head turning and shoulder shrug  were normal and symmetric. Motor: The motor testing reveals 5 over 5 strength of all 4 extremities.  Sensory: Sensory testing is intact to soft touch on all 4 extremities. No evidence of extinction is noted.  Coordination: Cerebellar testing reveals good finger-nose-finger and heel-to-shin bilaterally.  Gait and station: Gait is stable without assistive device. Tandem gait is mildly wide  Reflexes: Deep tendon reflexes brisk bilaterally     DIAGNOSTIC DATA (LABS, IMAGING, TESTING) - I reviewed patient records, labs, notes, testing and imaging myself where available.  Lab Results  Component Value Date   WBC 7.3 04/20/2021   HGB 14.7 04/20/2021   HCT 41.8 04/20/2021   MCV 93 04/20/2021   PLT 287 04/20/2021      Component Value Date/Time   NA 138 08/20/2020 1345   K 5.1 08/20/2020 1345   CL 99 08/20/2020 1345   CO2 25 08/20/2020 1345   GLUCOSE 69 08/20/2020 1345   GLUCOSE 138 (H) 02/23/2017 0452   BUN 5 (L) 08/20/2020 1345   CREATININE 0.67 08/20/2020 1345   CALCIUM 9.4 08/20/2020 1345   PROT 6.6 08/20/2020 1345  ALBUMIN 4.2 08/20/2020 1345   AST 14 08/20/2020 1345   ALT 8 08/20/2020 1345   ALKPHOS 91 08/20/2020 1345   BILITOT 0.3 08/20/2020 1345   GFRNONAA 110 08/20/2020 1345   GFRAA 126 08/20/2020 1345   No results found for: CHOL, HDL, LDLCALC, LDLDIRECT, TRIG,  CHOLHDL No results found for: ZOXW9U No results found for: VITAMINB12 Lab Results  Component Value Date   TSH 1.239 02/21/2017    No flowsheet data found.   No flowsheet data found.   ASSESSMENT AND PLAN  41 y.o. year old female  has a past medical history of Chronic pain syndrome, Depression, Fibromyalgia, GAD (generalized anxiety disorder), Headache, Multiple sclerosis (HCC) (10/28/2014), Obsessive compulsive disorder, and Vision abnormalities. here with   Relapsing remitting multiple sclerosis (HCC)  Multiple sclerosis (HCC) - Plan: CBC with Differential/Platelets, Stratify JCV Ab (w/ Index) w/ Rflx  High risk medication use  Other chronic pain  Muscle spasticity  Cognitive dysfunction  Depression, unspecified depression type  Other fatigue  Sumiko feels that MS symptoms are fairly stable. She will continue Tysabri infusions every 4 weeks. We will update labs today. MRI stable in 03/2020. PDMP reviewed and shows appropriate refills. She will continue clonazepam, duloxetine, modafinil, and tizanidine as prescribed. She was encouraged to continue healthy lifestyle habits. Fall precautions advised. She will follow up with Dr Epimenio Foot in 4-6 months.   Orders Placed This Encounter  Procedures   CBC with Differential/Platelets   Stratify JCV Ab (w/ Index) w/ Rflx     Meds ordered this encounter  Medications   tizanidine (ZANAFLEX) 2 MG capsule    Sig: Take 1 capsule (2 mg total) by mouth 3 (three) times daily.    Dispense:  90 capsule    Refill:  11    Order Specific Question:   Supervising Provider    Answer:   Anson Fret [[id number]]    Shawnie Dapper, MSN, FNP-C 09/01/2021, 3:31 PM  [facility] [address] [place], [place] [address] [phone]

## 2021-09-02 ENCOUNTER — Telehealth: Payer: Self-pay | Admitting: Neurology

## 2021-09-02 LAB — CBC WITH DIFFERENTIAL/PLATELET
Basophils Absolute: 0.1 10*3/uL (ref 0.0–0.2)
Basos: 1 %
EOS (ABSOLUTE): 0.1 10*3/uL (ref 0.0–0.4)
Eos: 1 %
Hematocrit: 40.6 % (ref 34.0–46.6)
Hemoglobin: 14.3 g/dL (ref 11.1–15.9)
Immature Grans (Abs): 0.1 10*3/uL (ref 0.0–0.1)
Immature Granulocytes: 1 %
Lymphocytes Absolute: 3.4 10*3/uL — ABNORMAL HIGH (ref 0.7–3.1)
Lymphs: 43 %
MCH: 31.8 pg (ref 26.6–33.0)
MCHC: 35.2 g/dL (ref 31.5–35.7)
MCV: 90 fL (ref 79–97)
Monocytes Absolute: 0.5 10*3/uL (ref 0.1–0.9)
Monocytes: 6 %
Neutrophils Absolute: 3.9 10*3/uL (ref 1.4–7.0)
Neutrophils: 48 %
Platelets: 302 10*3/uL (ref 150–450)
RBC: 4.49 x10E6/uL (ref 3.77–5.28)
RDW: 12.1 % (ref 11.7–15.4)
WBC: 8 10*3/uL (ref 3.4–10.8)

## 2021-09-02 NOTE — Telephone Encounter (Signed)
LATE ENTRY  Placed [facility] lab in [facility] lock box for routine lab pick up. Results pending.  (This was placed in the bin on 09/01/2021 for [facility] to pick up)

## 2021-09-08 NOTE — Telephone Encounter (Signed)
JCV ab drawn on 09/01/21 negative, index: 0.11. ?

## 2021-09-20 ENCOUNTER — Other Ambulatory Visit: Payer: Self-pay | Admitting: Family Medicine

## 2021-09-20 MED ORDER — OXYCODONE-ACETAMINOPHEN 5-325 MG PO TABS: ORAL_TABLET | ORAL | 0 refills | Status: DC

## 2021-09-20 NOTE — Telephone Encounter (Signed)
Pt request refill foroxyCODONE-acetaminophen (PERCOCET/ROXICET) 5-325 MG tablet at [facility] #[id number] 

## 2021-09-22 ENCOUNTER — Telehealth: Payer: Self-pay | Admitting: Family Medicine

## 2021-09-22 NOTE — Telephone Encounter (Signed)
[facility] LVM to get a diagnosis code for oxyCODONE-acetaminophen (PERCOCET/ROXICET) 5-325 MG tablet  Would like a call back ?

## 2021-09-22 NOTE — Telephone Encounter (Signed)
Called the pharmacy back and provided the ICD code G89.29 and G35 to them. ?

## 2021-10-06 ENCOUNTER — Telehealth: Payer: Self-pay | Admitting: Family Medicine

## 2021-10-06 NOTE — Telephone Encounter (Signed)
Took call from phone staff and spoke w/ Dois Davenport at [facility]. They are needing updated orders for Tysabri infusion, previous ones expired. Can fax to [phone]. Aware we will fax back today or tomorrow at the latest. She verbalized understanding.  ?I provided her fax# [phone] for future to send requests to if easier.  ? ?Order filled out, pending NP signature. ?

## 2021-10-07 NOTE — Telephone Encounter (Signed)
Faxed signed order back to [facility] at [phone]. Received fax confirmation. ?

## 2021-10-07 NOTE — Telephone Encounter (Signed)
[facility] Letta Pate) notify Tysabri infusion has expired. Will need an order, we reschedule her appt. ?

## 2021-10-08 DIAGNOSIS — G35 Multiple sclerosis: Secondary | ICD-10-CM | POA: Diagnosis not present

## 2021-10-21 ENCOUNTER — Other Ambulatory Visit: Payer: Self-pay | Admitting: Family Medicine

## 2021-10-21 MED ORDER — OXYCODONE-ACETAMINOPHEN 5-325 MG PO TABS: ORAL_TABLET | ORAL | 0 refills | Status: DC

## 2021-10-21 NOTE — Telephone Encounter (Signed)
Pt request refill for oxyCODONE-acetaminophen (PERCOCET/ROXICET) 5-325 MG tablet at [facility] ?

## 2021-11-04 ENCOUNTER — Telehealth: Payer: Self-pay | Admitting: *Deleted

## 2021-11-04 NOTE — Telephone Encounter (Signed)
Received a paper to be completed for [facility] adding ICD codes. I have completed and sent to the fax # provided on the sheet ?

## 2021-11-04 NOTE — Telephone Encounter (Signed)
PA remains denied through insurance. She can continue to use a goodrx ?

## 2021-11-04 NOTE — Telephone Encounter (Signed)
Submitted PA armodafinil on CMM. Key: [id number]. Waiting on determination from [facility].  ?

## 2021-11-17 ENCOUNTER — Other Ambulatory Visit: Payer: Self-pay | Admitting: Family Medicine

## 2021-11-17 MED ORDER — OXYCODONE-ACETAMINOPHEN 5-325 MG PO TABS: 1.0000 | ORAL_TABLET | Freq: Four times a day (QID) | ORAL | 0 refills | Status: DC | PRN

## 2021-11-17 NOTE — Telephone Encounter (Signed)
Pt is requesting a refill for  oxyCODONE-acetaminophen (PERCOCET/ROXICET) 5-325 MG tablet.  Pharmacy: [facility] #[id number]   

## 2021-11-25 ENCOUNTER — Other Ambulatory Visit: Payer: Self-pay | Admitting: Neurology

## 2021-12-14 DIAGNOSIS — G35 Multiple sclerosis: Secondary | ICD-10-CM | POA: Diagnosis not present

## 2021-12-16 ENCOUNTER — Other Ambulatory Visit: Payer: Self-pay | Admitting: Family Medicine

## 2021-12-16 MED ORDER — OXYCODONE-ACETAMINOPHEN 5-325 MG PO TABS: 1.0000 | ORAL_TABLET | Freq: Four times a day (QID) | ORAL | 0 refills | Status: DC | PRN

## 2021-12-16 NOTE — Telephone Encounter (Signed)
Pt is requesting a refill for  oxyCODONE-acetaminophen (PERCOCET/ROXICET) 5-325 MG tablet.  Pharmacy: [facility] #[id number]   

## 2022-01-03 ENCOUNTER — Other Ambulatory Visit: Payer: Self-pay | Admitting: Neurology

## 2022-01-03 NOTE — Telephone Encounter (Signed)
Patient is due for a refill on her armodafinil. Patient is up to date on her appointments. [place] Controlled Substance Registry checked and is appropriate.

## 2022-01-05 ENCOUNTER — Encounter: Payer: Self-pay | Admitting: Neurology

## 2022-01-05 ENCOUNTER — Ambulatory Visit (INDEPENDENT_AMBULATORY_CARE_PROVIDER_SITE_OTHER): Payer: Medicare Other | Admitting: Neurology

## 2022-01-05 ENCOUNTER — Telehealth: Payer: Self-pay | Admitting: Neurology

## 2022-01-05 VITALS — BP 133/90 | HR 91 | Ht 64.0 in | Wt 91.0 lb

## 2022-01-05 DIAGNOSIS — G35 Multiple sclerosis: Secondary | ICD-10-CM | POA: Diagnosis not present

## 2022-01-05 DIAGNOSIS — R5383 Other fatigue: Secondary | ICD-10-CM

## 2022-01-05 DIAGNOSIS — Z79899 Other long term (current) drug therapy: Secondary | ICD-10-CM | POA: Diagnosis not present

## 2022-01-05 DIAGNOSIS — M5432 Sciatica, left side: Secondary | ICD-10-CM | POA: Diagnosis not present

## 2022-01-05 DIAGNOSIS — R269 Unspecified abnormalities of gait and mobility: Secondary | ICD-10-CM

## 2022-01-05 DIAGNOSIS — R208 Other disturbances of skin sensation: Secondary | ICD-10-CM | POA: Diagnosis not present

## 2022-01-05 DIAGNOSIS — F09 Unspecified mental disorder due to known physiological condition: Secondary | ICD-10-CM

## 2022-01-05 DIAGNOSIS — G35D Multiple sclerosis, unspecified: Secondary | ICD-10-CM

## 2022-01-05 DIAGNOSIS — G8929 Other chronic pain: Secondary | ICD-10-CM | POA: Diagnosis not present

## 2022-01-05 MED ORDER — ARMODAFINIL 200 MG PO TABS: 1.0000 | ORAL_TABLET | Freq: Every day | ORAL | 5 refills | Status: DC

## 2022-01-05 NOTE — Progress Notes (Signed)
u  [facility]  PATIENT: Sabrina [name] DOB: 01/04/1981    _________________________________   HISTORICAL  CHIEF COMPLAINT:  Chief Complaint  Patient presents with   Follow-up    Rm 2, alone. Here for 4 month f/u. Pt never restarted tizanidine due to reaction with a type of antibiotic. Pn is not getting any better. Has blister on her feet, would like to see podiatrist if needed. Needs a handicap parking pass.     HISTORY OF PRESENT ILLNESS:  Sabrina [name] is a 41 y.o. woman with multiple sclerosis and chronic pain.     Update 01/05/2022: She is on Tysabri for RRMS.   She tolerates it well.   She has no recent exacerbation.  She has been JCV antibody negative.  Her last test was 11/28/2019 and was negative (0.13).   She continues to experience a lot of pain.     She is walking about the same.  She has a callus on her right foot.    She notes walking knock-kneed.      She has reduced balance.  She needs to hold the bannister on stairs.  No falls recently.  Leg strength is unchanged -  weaker on her left.   She has some numbness and tingling in her left leg.  She gets sciatica on her left side.   She has urinary urgency and also has some incomplete emptying. .She has sone urge incontinence.    She has some hesitancy as well as urgency.  She uses Depends at night.      She has fatigue and somnolence, helped by modafinil.  And she tolerates it well.   She sleeps well at niht and does not nap.    She has persistent left buttock and leg pain     Riding in a car is difficult .  She needs to sit on a cushion.   Injections (piriformis) did not help. Botox into the piriformis muscle only helped a week.     She is on Percocet 5 mg qid with some benefit though she feels it does not help sometimes she takes it..   She is also on lamotrigine, cyclobenzaprine (takes prn), clonazepam 0.5 once a day and occassionally at night.   The PDMP was reviewed and she is not getting med's from  other sources and does not have drug seeking behavior.     She weights the same as last time (91 pounds)   She is off Megace and feels her appetite improved on its own.   She feels se is eating well.        MS History:  She had right optic neuritis in December 2012 and had an MRI of the brain showing optic nerve inflammation and many white matter spots.    She saw Dr. Macon Large in [place].   She was started on Copaxone.  She likely had an exacerbation in 2013 when she had trouble walking x 2 months.   She received a few days of IV Solu-Medrol.  Last year, she also received a few days of IV Solu-Medrol but she is uncertain   She felt she did well for the first 2 years but switched to Gilenya because she was tired of the shots last year.   At first, she tolerated it well but then her depression got much worse and she had some stomach issues so she returned to Copaxone last month (20 mg daily).   MRI early 2016 showed multiple old MS plaques  and 1  Enhancing focus in the right frontal lobe prompting change to Tysabi.   She has been on Tysabri since 2016.  MRI Brain/spine 02/2017:  MRI of the brain and MRI of the thoracic spine performed during her recent hospitalization. The MRI of the brain shows many T2/FLAIR hyperintense foci in the periventricular, juxtacortical and deep white matter. Her to be any change when compared to her previous MRI. The MRI of the thoracic spine shows multiple T2 hyperintense lesions. The T6-T7 lesion is fairly large and there could be slight expansion of the spinal cord. However, there is no enhancement.          REVIEW OF SYSTEMS: Constitutional: No fevers, chills, sweats, or change in appetite.  Notes a lot of fatigue Eyes: No visual changes, double vision, eye pain Ear, nose and throat: No hearing loss, ear pain, nasal congestion, sore throat Cardiovascular: No chest pain, palpitations Respiratory:  No shortness of breath at rest or with exertion.   No  wheezes GastrointestinaI: No nausea, vomiting, diarrhea, abdominal pain, fecal incontinence Genitourinary:  as above. Musculoskeletal:  No neck pain, back pain Integumentary: No rash, pruritus, skin lesions Neurological: as above Psychiatric: Notes some depression and anxiety Endocrine: No palpitations, diaphoresis, change in appetite, change in weigh or increased thirst Hematologic/Lymphatic:  No anemia, purpura, petechiae. Allergic/Immunologic: No itchy/runny eyes, nasal congestion, recent allergic reactions, rashes  ALLERGIES: Allergies  Allergen Reactions   Amoxicillin Anaphylaxis   Penicillins Anaphylaxis   Sulfa Antibiotics Other (See Comments)    unknown   Codeine Nausea And Vomiting    HOME MEDICATIONS:  Current Outpatient Medications:    Armodafinil 200 MG TABS, Take 1 tablet by mouth once daily, Disp: 30 tablet, Rfl: 0   clonazePAM (KLONOPIN) 0.5 MG tablet, Take 1 tablet (0.5 mg total) by mouth 3 (three) times daily as needed., Disp: 60 tablet, Rfl: 5   DULoxetine (CYMBALTA) 60 MG capsule, TAKE 1 CAPSULE(60 MG) BY MOUTH DAILY, Disp: 90 capsule, Rfl: 3   feeding supplement, ENSURE ENLIVE, (ENSURE ENLIVE) LIQD, Take 237 mLs by mouth 2 (two) times daily between meals., Disp: 237 mL, Rfl: 12   naloxone (NARCAN) 0.4 MG/ML injection, Use as directed, Disp: 1 mL, Rfl: 1   natalizumab (TYSABRI) 300 MG/15ML injection, Inject 15 mLs (300 mg total) into the vein every 30 (thirty) days., Disp: 15 mL, Rfl: 11   oxyCODONE-acetaminophen (PERCOCET/ROXICET) 5-325 MG tablet, Take 1 tablet by mouth 4 (four) times daily as needed for severe pain. One po qid prn, Disp: 120 tablet, Rfl: 0   ranitidine (ZANTAC) 150 MG tablet, Take 150 mg by mouth 2 (two) times daily., Disp: , Rfl:    tizanidine (ZANAFLEX) 2 MG capsule, Take 1 capsule (2 mg total) by mouth 3 (three) times daily., Disp: 90 capsule, Rfl: 11   VITAMIN D PO, Take 1,000 Units by mouth daily., Disp: , Rfl:   PAST MEDICAL  HISTORY: Past Medical History:  Diagnosis Date   Chronic pain syndrome    Depression    Fibromyalgia    GAD (generalized anxiety disorder)    Headache    Multiple sclerosis (HCC) 10/28/2014   Obsessive compulsive disorder    Vision abnormalities     PAST SURGICAL HISTORY: Past Surgical History:  Procedure Laterality Date   NO PAST SURGERIES      FAMILY HISTORY: Family History  Problem Relation Age of Onset   Healthy Mother    Diabetes type II Father    Prostate cancer Father  SOCIAL HISTORY:  Social History   Socioeconomic History   Marital status: Married    Spouse name: Not on file   Number of children: Not on file   Years of education: Not on file   Highest education level: Not on file  Occupational History   Not on file  Tobacco Use   Smoking status: Every Day    Packs/day: 0.50    Types: Cigarettes   Smokeless tobacco: Never  Vaping Use   Vaping Use: Never used  Substance and Sexual Activity   Alcohol use: No    Alcohol/week: 0.0 standard drinks of alcohol   Drug use: No   Sexual activity: Not on file  Other Topics Concern   Not on file  Social History Narrative   Not on file   Social Determinants of Health   Financial Resource Strain: Not on file  Food Insecurity: Not on file  Transportation Needs: Not on file  Physical Activity: Not on file  Stress: Not on file  Social Connections: Not on file  Intimate Partner Violence: Not on file     PHYSICAL EXAM  Vitals:   01/05/22 1306  BP: 133/90  Pulse: 91  Weight: 91 lb (41.3 kg)  Height: 5\' 4"  (1.626 m)    Body mass index is 15.62 kg/m.   General: The patient is well-developed and well-nourished and in no acute distress  Musculoskeletal:   There is tenderness over the piriformis muscles, left much more so than right.  .  Neurologic Exam  Mental status:  The patient is alert and oriented x 3 at the time of the examination. The patient has apparent normal recent and remote  memory, with good attention span and concentration ability and normal speech today  Cranial nerves: Extraocular movements are full.  Facial strength and sensation was normal.  Trapezius strength was normal.  Hearing was normal and symmetric.  Motor:  Muscle bulk is normal.   Muscle tone is mildly increased in legs. Strength is 5/5.   Sensory: She has normal sensation in her arms.  She has reduced vibration sensation in the toes.  Coordination: Cerebellar testing reveals good finger-nose-finger bilaterally.  She has mildly reduced heel-to-shin bilaterally.  Gait and station: Station is normal.  Gait is mildly wide and tandem gait is moderately wide.. Romberg is negative.  Reflexes: Deep tendon reflexes are normal in the arms increased bilaterally with nonsustained clonus at the ankles      ASSESSMENT AND PLAN  Multiple sclerosis (HCC) - Plan: Stratify JCV Antibody Test ([facility]), CBC with Differential/Platelet  High risk medication use - Plan: Stratify JCV Antibody Test ([facility]), CBC with Differential/Platelet  Cognitive dysfunction  Other fatigue  Other chronic pain  Dysesthesia  Gait disorder  Sciatica of left side   1.  Continue Tysabri 300 mg every 4 weeks. Today, check JCV antibody and CBC with differential .  She will continue Tysabri infusions in [facility].    2.  She will continue her medications including clonazepam for anxiety and spasms, oxycodone for pain and armodafinil for fatigue and sleepiness. The PDMP was reviewed and she is compliant with medications and not getting prescriptions from other providers.  She is not showing drug-seeking behavior.   We discussed trying to cut the clonazepam down to one pill (she often just takes at [facility] and not the day) 3. Stay active and exercise as tolerated. 4.   rtc 4 months, or sooner if new or worsening neurologic symptoms  Sabrina Skeens A. Mehran Guderian,  MD, PhD 01/05/2022, 1:26 PM Certified in Neurology, Clinical  Neurophysiology, Sleep Medicine, Pain Medicine and Neuroimaging  [facility] [address] [place], [place] [address] [phone]

## 2022-01-05 NOTE — Telephone Encounter (Signed)
Placed JCV lab in [facility] lock box for routine lab pick up. Results pending. 

## 2022-01-06 LAB — CBC WITH DIFFERENTIAL/PLATELET
Basophils Absolute: 0.1 10*3/uL (ref 0.0–0.2)
Basos: 1 %
EOS (ABSOLUTE): 0.1 10*3/uL (ref 0.0–0.4)
Eos: 1 %
Hematocrit: 41.1 % (ref 34.0–46.6)
Hemoglobin: 14.1 g/dL (ref 11.1–15.9)
Immature Grans (Abs): 0 10*3/uL (ref 0.0–0.1)
Immature Granulocytes: 0 %
Lymphocytes Absolute: 3.1 10*3/uL (ref 0.7–3.1)
Lymphs: 43 %
MCH: 31.3 pg (ref 26.6–33.0)
MCHC: 34.3 g/dL (ref 31.5–35.7)
MCV: 91 fL (ref 79–97)
Monocytes Absolute: 0.5 10*3/uL (ref 0.1–0.9)
Monocytes: 7 %
Neutrophils Absolute: 3.6 10*3/uL (ref 1.4–7.0)
Neutrophils: 48 %
Platelets: 263 10*3/uL (ref 150–450)
RBC: 4.51 x10E6/uL (ref 3.77–5.28)
RDW: 12.6 % (ref 11.7–15.4)
WBC: 7.4 10*3/uL (ref 3.4–10.8)

## 2022-01-12 NOTE — Telephone Encounter (Signed)
JCV ab drawn on 01/05/22 negative, index: 0.19.

## 2022-01-13 ENCOUNTER — Other Ambulatory Visit: Payer: Self-pay | Admitting: Neurology

## 2022-01-13 MED ORDER — OXYCODONE-ACETAMINOPHEN 5-325 MG PO TABS: 1.0000 | ORAL_TABLET | Freq: Four times a day (QID) | ORAL | 0 refills | Status: DC | PRN

## 2022-01-13 NOTE — Telephone Encounter (Signed)
Pt is requesting a refill for  oxyCODONE-acetaminophen (PERCOCET/ROXICET) 5-325 MG tablet.  Pharmacy: [facility] [phone]

## 2022-01-18 ENCOUNTER — Other Ambulatory Visit: Payer: Self-pay | Admitting: Neurology

## 2022-01-18 MED ORDER — OXYCODONE-ACETAMINOPHEN 5-325 MG PO TABS: 1.0000 | ORAL_TABLET | Freq: Four times a day (QID) | ORAL | 0 refills | Status: DC | PRN

## 2022-01-18 NOTE — Telephone Encounter (Signed)
Pt's husband called stating that the pt's oxyCODONE-acetaminophen (PERCOCET/ROXICET) 5-325 MG tablet was sent to the wrong pharmacy and they need it called in to the [facility] #[id number] - [place], [place] - [address] AT [facility] [address] Husband would like this change to be done before the pharmacy closes today if possible.

## 2022-01-18 NOTE — Telephone Encounter (Signed)
Pt called and stated the prescription for  oxyCODONE-acetaminophen (PERCOCET/ROXICET) 5-325 MG tablet.Needs to be sent [facility] #[id number] - [place], [place] - [address] AT [facility] [address]

## 2022-01-25 ENCOUNTER — Telehealth: Payer: Self-pay

## 2022-01-25 NOTE — Telephone Encounter (Signed)
I called [facility] to find out when patient's last and next tysabri infusions are. No answer, left a message asking them to call us back.  If they return my call please obtain this information.

## 2022-01-27 DIAGNOSIS — G35 Multiple sclerosis: Secondary | ICD-10-CM | POA: Diagnosis not present

## 2022-02-02 ENCOUNTER — Other Ambulatory Visit: Payer: Self-pay | Admitting: Neurology

## 2022-02-02 MED ORDER — CLONAZEPAM 0.5 MG PO TABS: 0.5000 mg | ORAL_TABLET | Freq: Three times a day (TID) | ORAL | 5 refills | Status: DC | PRN

## 2022-02-02 MED ORDER — ARMODAFINIL 200 MG PO TABS: 1.0000 | ORAL_TABLET | Freq: Every day | ORAL | 5 refills | Status: DC

## 2022-02-02 NOTE — Telephone Encounter (Signed)
Last OV was on 01/05/22.  Next OV is scheduled for 05/18/22.  Last RX was written on 01/03/22 for 60 tabs.   [place] Drug Database has been reviewed.

## 2022-02-02 NOTE — Telephone Encounter (Signed)
Pt is calling and requesting a refill on clonazePAM (KLONOPIN) 0.5 MG tablet and Armodafinil 200 MG TABS. Pt would like this sent to [facility] [phone]

## 2022-02-15 ENCOUNTER — Other Ambulatory Visit: Payer: Self-pay | Admitting: Neurology

## 2022-02-17 ENCOUNTER — Other Ambulatory Visit: Payer: Self-pay | Admitting: Neurology

## 2022-02-17 MED ORDER — OXYCODONE-ACETAMINOPHEN 5-325 MG PO TABS: 1.0000 | ORAL_TABLET | Freq: Four times a day (QID) | ORAL | 0 refills | Status: DC | PRN

## 2022-02-17 NOTE — Telephone Encounter (Signed)
Pt request refill foroxyCODONE-acetaminophen (PERCOCET/ROXICET) 5-325 MG tablet at [facility] [phone]

## 2022-02-17 NOTE — Telephone Encounter (Signed)
Last OV 01/05/22, next OV 05/18/22. Checked drug registry. Last refilled 01/19/22 #120.

## 2022-03-17 ENCOUNTER — Other Ambulatory Visit: Payer: Self-pay | Admitting: Neurology

## 2022-03-17 MED ORDER — OXYCODONE-ACETAMINOPHEN 5-325 MG PO TABS: 1.0000 | ORAL_TABLET | Freq: Four times a day (QID) | ORAL | 0 refills | Status: DC | PRN

## 2022-03-17 NOTE — Telephone Encounter (Signed)
Pt last seen 01/05/22 and next f/u 05/18/22. Per drug registry, last refilled 02/18/22 #120.

## 2022-03-17 NOTE — Telephone Encounter (Signed)
Pt is requesting a refill for  oxyCODONE-acetaminophen (PERCOCET/ROXICET) 5-325 MG tablet.  Pharmacy: [facility] [phone]

## 2022-03-17 NOTE — Addendum Note (Signed)
Addended by: Arther Abbott on: 03/17/2022 10:49 AM   Modules accepted: Orders

## 2022-03-23 ENCOUNTER — Telehealth: Payer: Self-pay | Admitting: Neurology

## 2022-03-23 NOTE — Telephone Encounter (Signed)
Pt mother Rosey Bath) is calling. Stated if Pt can come here and get Tysabri Infusion. Rosey Bath said they are having issues with Pt medicaid in [place].

## 2022-03-24 ENCOUNTER — Telehealth: Payer: Self-pay | Admitting: Neurology

## 2022-03-24 NOTE — Telephone Encounter (Signed)
Called [facility] where she gets her infusions and spoke with Violet. They stated that the patient [place] healthcare Medicaid is showing inactive.  They can schedule her for the infusion but the patient would have to pay a co-pay 600$.  They advised she needs to contact social services to reinstate her Medicaid.  Once that has been completed she will no longer have a co-pay. Contacted the mom, she states that they were unaware that they had an activated her Medicaid and are working on getting that reinstated.  She asked about transferring here to receive infusions. Due to the length of that process and the fact she is not established here this would be difficult and likely would push care out further. I have advised the patient to contact [employer] and see if they have any advice on this situation and whether they can help while they are in the middle of this process.  Advised they should stay on top of the SS reinstating the medicaid for the patient.  Alternatively we could discuss potentially looking an alternative therapy all together through Dr Epimenio Foot.   The mom verbalized understanding and will continue to work on this and contact us for issues or concerns.

## 2022-03-24 NOTE — Telephone Encounter (Signed)
Per Jael this morning: "Mother called wanting to know if the pt can come to the office Infusion Suite to receive her Tysabri. Please advise. "

## 2022-03-24 NOTE — Telephone Encounter (Signed)
See other phone note, this is duplicate. 

## 2022-03-24 NOTE — Telephone Encounter (Signed)
Mother called wanting to know if the pt can come to the office Infusion Suite to receive her Tysabri. Please advise.

## 2022-04-18 ENCOUNTER — Other Ambulatory Visit: Payer: Self-pay | Admitting: *Deleted

## 2022-04-18 ENCOUNTER — Telehealth: Payer: Self-pay | Admitting: Neurology

## 2022-04-18 MED ORDER — OXYCODONE-ACETAMINOPHEN 5-325 MG PO TABS: 1.0000 | ORAL_TABLET | Freq: Four times a day (QID) | ORAL | 0 refills | Status: DC | PRN

## 2022-04-18 NOTE — Telephone Encounter (Signed)
Pt is calling. Requesting a refill on oxyCODONE-acetaminophen (PERCOCET/ROXICET) 5-325 MG tablet. Refill should be sent to [facility] #[id number].

## 2022-04-18 NOTE — Telephone Encounter (Signed)
Sent refill request to [facility], Dr. Brett Fairy, since Dr. Felecia Shelling out.

## 2022-04-19 ENCOUNTER — Telehealth: Payer: Self-pay | Admitting: Family Medicine

## 2022-04-19 NOTE — Telephone Encounter (Signed)
LVM and sent mychart msg informing pt of need to reschedule 11/8 appointment - NP out 

## 2022-05-03 ENCOUNTER — Telehealth: Payer: Self-pay | Admitting: *Deleted

## 2022-05-03 NOTE — Telephone Encounter (Signed)
Submitted PA armodafinil on CMM. Key: [id number]. Waiting on determination from [facility].   Previously denied and pt was using goodrx coupon. If denied again, can continue using goodrx coupon

## 2022-05-04 NOTE — Telephone Encounter (Signed)
PA denied again, pt can use goodrx coupon.

## 2022-05-10 DIAGNOSIS — G35 Multiple sclerosis: Secondary | ICD-10-CM | POA: Diagnosis not present

## 2022-05-12 NOTE — Patient Instructions (Incomplete)

## 2022-05-12 NOTE — Progress Notes (Deleted)
PATIENT: Sabrina [name] DOB: 1980/11/01  REASON FOR VISIT: follow up HISTORY FROM: patient  Virtual Visit via Telephone Note  I connected with Sabrina [name] on 05/12/22 at 10:00 AM EST by telephone and verified that I am speaking with the correct person using two identifiers.   I discussed the limitations, risks, security and privacy concerns of performing an evaluation and management service by telephone and the availability of in person appointments. I also discussed with the patient that there may be a patient responsible charge related to this service. The patient expressed understanding and agreed to proceed.   History of Present Illness:  09/01/21 ALL:  Sabrina [name] is a 41 y.o. female here today for follow up for RRMS. She continues Tysabri infusions. Labs have been stable. Last infusion on 07/16/2021. February infusion was missed but has been rescheduled for next week. MRI in 03/2020 showed stable MS and progressing cerebral atrophy.   She feels that MS symptoms are about the same. She denies new or exacerbating symptoms. No falls. Gait is stable. She uses Rolator for longer distances (greater than 10 minutes). She continues to have low back, left leg and buttock pain. Oxycodone 5/325mg  QID helps a little. She reports that she never received tizanidine???. She is no longer taking cyclobenzaprine. It made her too groggy.   Mood is stable. She continues duloxetine 60mg  daily and clonazepam 0.5mg  up to TID (usually BID) helps with mood, sleep, and pain. Memory is stable. No significant changes. She is sleeping "great." She sleeps for about 7-8 hours a night. Appetite is ok. Weight is stable. She has gained three pounds since 04/2021. She continues to drink 05/2021 (sometimes adds ice cream) 1-2 times daily.   She feels fatigue is about the same. She continues armodafinil 200mg  daily. She feels that she would not be able to function without it. She tries to stay active  around the home. She does have have an exercise regimen.   She feels bladder and bowel habits are stable, no significant changes. She did not wish to take oxybutynin.   HISTORY (copied from Dr [employer] previous note)  Sabrina [name] is a 41 y.o. woman with multiple sclerosis and chronic pain.      Update 01/05/2022: She is on Tysabri for RRMS.   She tolerates it well.   She has no recent exacerbation.  She has been JCV antibody negative.  Her last test was 11/28/2019 and was negative (0.13).   She continues to experience a lot of pain.      She is walking about the same.  She has a callus on her right foot.    She notes walking knock-kneed.       She has reduced balance.  She needs to hold the bannister on stairs.  No falls recently.  Leg strength is unchanged -  weaker on her left.   She has some numbness and tingling in her left leg.  She gets sciatica on her left side.   She has urinary urgency and also has some incomplete emptying. .She has sone urge incontinence.    She has some hesitancy as well as urgency.  She uses Depends at night.       She has fatigue and somnolence, helped by modafinil.  And she tolerates it well.   She sleeps well at niht and does not nap.     She has persistent left buttock and leg pain     Riding in a car is difficult .  She needs to sit on a cushion.   Injections (piriformis) did not help. Botox into the piriformis muscle only helped a week.      She is on Percocet 5 mg qid with some benefit though she feels it does not help sometimes she takes it..   She is also on lamotrigine, cyclobenzaprine (takes prn), clonazepam 0.5 once a day and occassionally at night.   The PDMP was reviewed and she is not getting med's from other sources and does not have drug seeking behavior.      She weights the same as last time (91 pounds)   She is off Megace and feels her appetite improved on its own.   She feels se is eating well.       MS History:  She had right optic neuritis in  December 2012 and had an MRI of the brain showing optic nerve inflammation and many white matter spots.    She saw Dr. Harolyn Rutherford in [place].   She was started on Copaxone.  She likely had an exacerbation in 2013 when she had trouble walking x 2 months.   She received a few days of IV Solu-Medrol.  Last year, she also received a few days of IV Solu-Medrol but she is uncertain   She felt she did well for the first 2 years but switched to Gilenya because she was tired of the shots last year.   At first, she tolerated it well but then her depression got much worse and she had some stomach issues so she returned to Copaxone last month (20 mg daily).   MRI early 2016 showed multiple old MS plaques and 1  Enhancing focus in the right frontal lobe prompting change to Tysabi.   She has been on Tysabri since 2016.   MRI Brain/spine 02/2017:  MRI of the brain and MRI of the thoracic spine performed during her recent hospitalization. The MRI of the brain shows many T2/FLAIR hyperintense foci in the periventricular, juxtacortical and deep white matter. Her to be any change when compared to her previous MRI. The MRI of the thoracic spine shows multiple T2 hyperintense lesions. The T6-T7 lesion is fairly large and there could be slight expansion of the spinal cord. However, there is no enhancement.  Observations/Objective:  Generalized: Well developed, in no acute distress  Mentation: Alert oriented to time, place, history taking. Follows all commands speech and language fluent   Assessment and Plan:  41 y.o. year old female  has a past medical history of Chronic pain syndrome, Depression, Fibromyalgia, GAD (generalized anxiety disorder), Headache, Multiple sclerosis ([place]) (10/28/2014), Obsessive compulsive disorder, and Vision abnormalities. here with  No diagnosis found.    No orders of the defined types were placed in this encounter.   No orders of the defined types were placed in this encounter.    Follow  Up Instructions:  I discussed the assessment and treatment plan with the patient. The patient was provided an opportunity to ask questions and all were answered. The patient agreed with the plan and demonstrated an understanding of the instructions.   The patient was advised to call back or seek an in-person evaluation if the symptoms worsen or if the condition fails to improve as anticipated.  I provided *** minutes of non-face-to-face time during this encounter. Patient located at their place of residence during [place] visit. Provider is in the office.    Debbora Presto, NP

## 2022-05-16 ENCOUNTER — Telehealth: Payer: Self-pay | Admitting: Neurology

## 2022-05-16 ENCOUNTER — Other Ambulatory Visit: Payer: Self-pay | Admitting: Neurology

## 2022-05-16 MED ORDER — OXYCODONE-ACETAMINOPHEN 5-325 MG PO TABS: 1.0000 | ORAL_TABLET | Freq: Four times a day (QID) | ORAL | 0 refills | Status: DC | PRN

## 2022-05-16 NOTE — Telephone Encounter (Signed)
Pt is requesting a refill for oxyCODONE-acetaminophen (PERCOCET/ROXICET) 5-325 MG tablet to .  Pharmacy: [facility] [phone]

## 2022-05-17 ENCOUNTER — Other Ambulatory Visit: Payer: Self-pay | Admitting: Neurology

## 2022-05-17 MED ORDER — OXYCODONE-ACETAMINOPHEN 5-325 MG PO TABS: 1.0000 | ORAL_TABLET | Freq: Four times a day (QID) | ORAL | 0 refills | Status: DC | PRN

## 2022-05-17 NOTE — Telephone Encounter (Signed)
Pt's Sabrina [name] oxyCODONE-acetaminophen (PERCOCET/ROXICET) 5-325 MG tablet was sent to the wrong pharmacy. That pharmacy is closing, need refill resent  [facility]  [place]  [phone]

## 2022-05-18 ENCOUNTER — Telehealth: Payer: Self-pay | Admitting: Neurology

## 2022-05-18 ENCOUNTER — Other Ambulatory Visit: Payer: Self-pay

## 2022-05-18 ENCOUNTER — Telehealth: Payer: Medicare Other | Admitting: Family Medicine

## 2022-05-18 MED ORDER — OXYCODONE-ACETAMINOPHEN 5-325 MG PO TABS: 1.0000 | ORAL_TABLET | Freq: Four times a day (QID) | ORAL | 0 refills | Status: DC | PRN

## 2022-05-18 NOTE — Telephone Encounter (Signed)
Pt called stating that her oxyCODONE-acetaminophen (PERCOCET/ROXICET) 5-325 MG tablet is not in stock at her pharmacy but states that the [facility] on [address] [place], [place] [address] is the only one that has it. Pt would like for it to be called in to the [facility] for her. Please advise.

## 2022-05-18 NOTE — Telephone Encounter (Signed)
Deno Lunger, CMA rerouting refill request to MD to resend to [facility] instead.

## 2022-05-18 NOTE — Addendum Note (Signed)
Addended by: Berneice Gandy A on: 05/18/2022 03:38 PM   Modules accepted: Orders

## 2022-05-20 ENCOUNTER — Telehealth: Payer: Medicare Other | Admitting: Family Medicine

## 2022-05-20 ENCOUNTER — Telehealth: Payer: Self-pay | Admitting: Neurology

## 2022-05-20 DIAGNOSIS — G8929 Other chronic pain: Secondary | ICD-10-CM

## 2022-05-20 DIAGNOSIS — R5383 Other fatigue: Secondary | ICD-10-CM

## 2022-05-20 DIAGNOSIS — Z79899 Other long term (current) drug therapy: Secondary | ICD-10-CM

## 2022-05-20 DIAGNOSIS — F09 Unspecified mental disorder due to known physiological condition: Secondary | ICD-10-CM

## 2022-05-20 DIAGNOSIS — G35 Multiple sclerosis: Secondary | ICD-10-CM

## 2022-05-20 DIAGNOSIS — R269 Unspecified abnormalities of gait and mobility: Secondary | ICD-10-CM

## 2022-05-20 NOTE — Telephone Encounter (Signed)
LVM to remind pt of Mychart Video Visit 10am today

## 2022-06-15 ENCOUNTER — Other Ambulatory Visit: Payer: Self-pay | Admitting: Neurology

## 2022-06-15 MED ORDER — OXYCODONE-ACETAMINOPHEN 5-325 MG PO TABS: 1.0000 | ORAL_TABLET | Freq: Four times a day (QID) | ORAL | 0 refills | Status: DC | PRN

## 2022-06-15 NOTE — Telephone Encounter (Signed)
Pt spouse is calling. Requesting refill on medication  oxyCODONE-acetaminophen (PERCOCET/ROXICET) 5-325 MG table. Refill should be sent to [facility] [phone]

## 2022-06-15 NOTE — Telephone Encounter (Signed)
Pt last seen 01/05/22. No showed appt 05/20/22 w/ AL,NP.   Per drug registry, last refilled oxycodone 05/18/22 #120.  I called pt and offered appt tomorrow at 9am with Dr. Epimenio Foot. She does not think she can coordinate transportation with father that quick. Scheduled appt for 06/20/22 at 9am instead with Dr. Epimenio Foot.

## 2022-06-20 ENCOUNTER — Encounter: Payer: Self-pay | Admitting: Neurology

## 2022-06-20 ENCOUNTER — Ambulatory Visit: Payer: Medicare Other | Admitting: Neurology

## 2022-06-20 ENCOUNTER — Telehealth: Payer: Self-pay | Admitting: Neurology

## 2022-06-20 NOTE — Telephone Encounter (Signed)
Sabrina [name] is calling from hospital. Stated pt did not show up for her infusion treatment for today.

## 2022-06-20 NOTE — Telephone Encounter (Signed)
Called pt. She will call back and r/s her infusion.  She sates dog sick and unable to make appt today.  She missed appt this am at 9am with Dr. Epimenio Foot. Rescheduled to tomorrow at 1:30pm with Dr. Epimenio Foot. Asked her to check in at 1:00pm. She will see if someone can bring her to appt.

## 2022-06-21 ENCOUNTER — Telehealth: Payer: Self-pay | Admitting: Neurology

## 2022-06-21 ENCOUNTER — Ambulatory Visit (INDEPENDENT_AMBULATORY_CARE_PROVIDER_SITE_OTHER): Payer: Medicare Other | Admitting: Neurology

## 2022-06-21 ENCOUNTER — Encounter: Payer: Self-pay | Admitting: Neurology

## 2022-06-21 ENCOUNTER — Telehealth: Payer: Self-pay | Admitting: *Deleted

## 2022-06-21 VITALS — BP 130/84 | HR 82 | Ht 64.0 in | Wt 83.0 lb

## 2022-06-21 DIAGNOSIS — R636 Underweight: Secondary | ICD-10-CM | POA: Diagnosis not present

## 2022-06-21 DIAGNOSIS — R5383 Other fatigue: Secondary | ICD-10-CM | POA: Diagnosis not present

## 2022-06-21 DIAGNOSIS — R208 Other disturbances of skin sensation: Secondary | ICD-10-CM

## 2022-06-21 DIAGNOSIS — R269 Unspecified abnormalities of gait and mobility: Secondary | ICD-10-CM

## 2022-06-21 DIAGNOSIS — G35 Multiple sclerosis: Secondary | ICD-10-CM | POA: Diagnosis not present

## 2022-06-21 DIAGNOSIS — Z79899 Other long term (current) drug therapy: Secondary | ICD-10-CM

## 2022-06-21 DIAGNOSIS — M5432 Sciatica, left side: Secondary | ICD-10-CM

## 2022-06-21 DIAGNOSIS — G8929 Other chronic pain: Secondary | ICD-10-CM | POA: Diagnosis not present

## 2022-06-21 DIAGNOSIS — F09 Unspecified mental disorder due to known physiological condition: Secondary | ICD-10-CM

## 2022-06-21 MED ORDER — ARMODAFINIL 200 MG PO TABS: 1.0000 | ORAL_TABLET | Freq: Every day | ORAL | 5 refills | Status: DC

## 2022-06-21 MED ORDER — CLONAZEPAM 0.5 MG PO TABS: 0.5000 mg | ORAL_TABLET | Freq: Three times a day (TID) | ORAL | 5 refills | Status: DC | PRN

## 2022-06-21 MED ORDER — MEGESTROL ACETATE 40 MG PO TABS: 40.0000 mg | ORAL_TABLET | Freq: Two times a day (BID) | ORAL | 11 refills | Status: DC

## 2022-06-21 NOTE — Telephone Encounter (Signed)
Placed JCV lab in [facility] lock box for routine lab pick up. Results pending. 

## 2022-06-21 NOTE — Telephone Encounter (Signed)
medicare/medicaid NPR sent to GI [phone] 

## 2022-06-21 NOTE — Progress Notes (Signed)
u  [facility]  PATIENT: Sabrina [name] DOB: May 12, 1981    _________________________________   HISTORICAL  CHIEF COMPLAINT:  Chief Complaint  Patient presents with   Follow-up    Pt in room #11 with her mother. Pt here today for f/u on her MS.    HISTORY OF PRESENT ILLNESS:  Sabrina [name] is a 41 y.o. woman with multiple sclerosis and chronic pain.     Update 06/21/2022: She is on Tysabri for RRMS.   She tolerates it well.   She had issues with MCD/MCR and she missed 2 doses.    She has no recent exacerbation.  She has been JCV antibody negative.  Her last test was 01/05/2022 and was negative (0.19).   She continues to experience a lot of pain.     She notes walking is a little off balanced - worse in wind.   She has a few stumbles but no falls.    She needs to hold the bannister on stairs.  She notes some weakness on her left.   She has some numbness and tingling in her left leg.  She gets sciatica on her left side.   She has urinary urgency and  hesitancy.   She uses Depends at night for once a week incontinence.    She notes vision is a little worse and we discussed her seeing an ophthalmologist for eye exam and new glasses  She has fatigue and somnolence, helped by modafinil.  And she tolerates it well.   She sleeps well at niht and does not nap.    She has persistent left buttock and leg pain     Riding in a car is difficult .  She needs to sit on a pillow and often stands p to shift or leans forward.   Injections (piriformis) did not help. Botox into the piriformis muscle only helped a week.   Pain has been present for many years.  MRI of the lumbar spine in 2016 was normal.  She is on Percocet 5 mg qid with some benefit though she feels it does not help sometimes she takes it..   She is also on lamotrigine, cyclobenzaprine (takes prn), clonazepam 0.5 once a day and occassionally at night.   The PDMP was reviewed and she is not getting med's from other sources  and does not have drug seeking behavior.     She weights less than last time (83 pounds now)   She is off Megace and feels her appetite improved on its own.   She feels she is eating well.        MS History:  She had right optic neuritis in December 2012 and had an MRI of the brain showing optic nerve inflammation and many white matter spots.    She saw Dr. Macon Large in [place].   She was started on Copaxone.  She likely had an exacerbation in 2013 when she had trouble walking x 2 months.   She received a few days of IV Solu-Medrol.  Last year, she also received a few days of IV Solu-Medrol but she is uncertain   She felt she did well for the first 2 years but switched to Gilenya because she was tired of the shots last year.   At first, she tolerated it well but then her depression got much worse and she had some stomach issues so she returned to Copaxone last month (20 mg daily).   MRI early 2016 showed multiple old MS  plaques and 1  Enhancing focus in the right frontal lobe prompting change to Tysabi.   She has been on Tysabri since 2016 and has been JCV Ab negative.  MRI Brain/spine 02/2017:  MRI of the brain and MRI of the thoracic spine performed during her recent hospitalization. The MRI of the brain shows many T2/FLAIR hyperintense foci in the periventricular, juxtacortical and deep white matter. Her to be any change when compared to her previous MRI. The MRI of the thoracic spine shows multiple T2 hyperintense lesions. The T6-T7 lesion is fairly large and there could be slight expansion of the spinal cord. However, there is no enhancement.      MRI brain 04/02/2020 was unchanged.        REVIEW OF SYSTEMS: Constitutional: No fevers, chills, sweats, or change in appetite.  Notes a lot of fatigue Eyes: No visual changes, double vision, eye pain Ear, nose and throat: No hearing loss, ear pain, nasal congestion, sore throat Cardiovascular: No chest pain, palpitations Respiratory:  No shortness of  breath at rest or with exertion.   No wheezes GastrointestinaI: No nausea, vomiting, diarrhea, abdominal pain, fecal incontinence Genitourinary:  as above. Musculoskeletal:  No neck pain, back pain Integumentary: No rash, pruritus, skin lesions Neurological: as above Psychiatric: Notes some depression and anxiety Endocrine: No palpitations, diaphoresis, change in appetite, change in weigh or increased thirst Hematologic/Lymphatic:  No anemia, purpura, petechiae. Allergic/Immunologic: No itchy/runny eyes, nasal congestion, recent allergic reactions, rashes  ALLERGIES: Allergies  Allergen Reactions   Amoxicillin Anaphylaxis   Penicillins Anaphylaxis   Sulfa Antibiotics Other (See Comments)    unknown   Codeine Nausea And Vomiting    HOME MEDICATIONS:  Current Outpatient Medications:    DULoxetine (CYMBALTA) 60 MG capsule, Take 1 capsule by mouth once daily, Disp: 90 capsule, Rfl: 1   feeding supplement, ENSURE ENLIVE, (ENSURE ENLIVE) LIQD, Take 237 mLs by mouth 2 (two) times daily between meals., Disp: 237 mL, Rfl: 12   naloxone (NARCAN) 0.4 MG/ML injection, Use as directed, Disp: 1 mL, Rfl: 1   natalizumab (TYSABRI) 300 MG/15ML injection, Inject 15 mLs (300 mg total) into the vein every 30 (thirty) days., Disp: 15 mL, Rfl: 11   oxyCODONE-acetaminophen (PERCOCET/ROXICET) 5-325 MG tablet, Take 1 tablet by mouth 4 (four) times daily as needed for severe pain. One po qid prn, Disp: 120 tablet, Rfl: 0   ranitidine (ZANTAC) 150 MG tablet, Take 150 mg by mouth 2 (two) times daily., Disp: , Rfl:    tizanidine (ZANAFLEX) 2 MG capsule, Take 1 capsule (2 mg total) by mouth 3 (three) times daily., Disp: 90 capsule, Rfl: 11   VITAMIN D PO, Take 1,000 Units by mouth daily., Disp: , Rfl:    Armodafinil 200 MG TABS, Take 1 tablet by mouth daily., Disp: 30 tablet, Rfl: 5   clonazePAM (KLONOPIN) 0.5 MG tablet, Take 1 tablet (0.5 mg total) by mouth 3 (three) times daily as needed., Disp: 60 tablet, Rfl:  5  PAST MEDICAL HISTORY: Past Medical History:  Diagnosis Date   Chronic pain syndrome    Depression    Fibromyalgia    GAD (generalized anxiety disorder)    Headache    Multiple sclerosis (HCC) 10/28/2014   Obsessive compulsive disorder    Vision abnormalities     PAST SURGICAL HISTORY: Past Surgical History:  Procedure Laterality Date   NO PAST SURGERIES      FAMILY HISTORY: Family History  Problem Relation Age of Onset   Healthy Mother  Diabetes type II Father    Prostate cancer Father     SOCIAL HISTORY:  Social History   Socioeconomic History   Marital status: Married    Spouse name: Not on file   Number of children: Not on file   Years of education: Not on file   Highest education level: Not on file  Occupational History   Not on file  Tobacco Use   Smoking status: Every Day    Packs/day: 0.50    Types: Cigarettes   Smokeless tobacco: Never  Vaping Use   Vaping Use: Never used  Substance and Sexual Activity   Alcohol use: No    Alcohol/week: 0.0 standard drinks of alcohol   Drug use: No   Sexual activity: Not on file  Other Topics Concern   Not on file  Social History Narrative   Not on file   Social Determinants of Health   Financial Resource Strain: Not on file  Food Insecurity: Not on file  Transportation Needs: Not on file  Physical Activity: Not on file  Stress: Not on file  Social Connections: Not on file  Intimate Partner Violence: Not on file     PHYSICAL EXAM  Vitals:   06/21/22 1328  BP: 130/84  Pulse: 82  Weight: 83 lb (37.6 kg)  Height: 5\' 4"  (1.626 m)    Body mass index is 14.25 kg/m.   General: The patient is well-developed and well-nourished and in no acute distress  Musculoskeletal:   There is tenderness over the piriformis muscles/SI joints, left much more so than right.  .  Neurologic Exam  Mental status:  The patient is alert and oriented x 3 at the time of the examination. The patient has apparent  normal recent and remote memory, with good attention span and concentration ability and normal speech today  Cranial nerves: Extraocular movements are full.  Facial strength and sensation was normal.  Trapezius strength was normal.  Hearing was normal and symmetric.  Motor:  Muscle bulk is normal.   Muscle tone is mildly increased in legs. Strength is 5/5.   Sensory: She has normal sensation in her arms.  She has reduced vibration sensation in the toes.  Coordination: Cerebellar testing reveals good finger-nose-finger bilaterally.  She has mildly reduced heel-to-shin bilaterally.  Gait and station: Station is normal.  The gait is mildly wide.  Tandem gait is moderately wide.. Romberg is negative.  Reflexes: Deep tendon reflexes are normal in the arms increased bilaterally with nonsustained clonus at the ankles      ASSESSMENT AND PLAN  Multiple sclerosis (HCC) - Plan: Stratify JCV Antibody Test ([facility]), CBC with Differential/Platelet  High risk medication use - Plan: Stratify JCV Antibody Test ([facility]), CBC with Differential/Platelet  Cognitive dysfunction  Other chronic pain  Dysesthesia  Gait disorder  Other fatigue  Sciatica of left side  Underweight   1.  Continue Tysabri 300 mg every 4 weeks. Today, we will check JCV antibody and CBC with differential .  She will continue Tysabri infusions in [facility].      Will request MRI around time of next visit to determine if there is any subclinical progression.  If this is occurring, we would need to consider a different disease modifying therapy. 2.  She will continue her medications including clonazepam for anxiety and spasms, oxycodone for pain and armodafinil for fatigue and sleepiness. The PDMP was reviewed and she is compliant with medications and not getting prescriptions from other providers.  She  is not showing drug-seeking behavior.   Try to cut the clonazepam just at night but can do bid if needed.  We will  check a drug screen today. 3. Stay active and exercise as tolerated. 4.   rtc  4 months, or sooner if new or worsening neurologic symptoms  40-minute office visit with the majority of the time spent face-to-face for history and physical, discussion/counseling and decision-making.  Additional time with record review and documentation.    Zuzu Befort A. Epimenio Foot, MD, PhD 06/21/2022, 2:03 PM Certified in Neurology, Clinical Neurophysiology, Sleep Medicine, Pain Medicine and Neuroimaging  [facility] [address], [address] [place], [place] [address] [phone]

## 2022-06-28 LAB — COMPREHENSIVE METABOLIC PANEL
ALT: 8 IU/L (ref 0–32)
AST: 13 IU/L (ref 0–40)
Albumin/Globulin Ratio: 2.2 (ref 1.2–2.2)
Albumin: 4.6 g/dL (ref 3.9–4.9)
Alkaline Phosphatase: 96 IU/L (ref 44–121)
BUN/Creatinine Ratio: 10 (ref 9–23)
BUN: 7 mg/dL (ref 6–24)
Bilirubin Total: 0.3 mg/dL (ref 0.0–1.2)
CO2: 26 mmol/L (ref 20–29)
Calcium: 9.2 mg/dL (ref 8.7–10.2)
Chloride: 101 mmol/L (ref 96–106)
Creatinine, Ser: 0.67 mg/dL (ref 0.57–1.00)
Globulin, Total: 2.1 g/dL (ref 1.5–4.5)
Glucose: 88 mg/dL (ref 70–99)
Potassium: 4.3 mmol/L (ref 3.5–5.2)
Sodium: 139 mmol/L (ref 134–144)
Total Protein: 6.7 g/dL (ref 6.0–8.5)
eGFR: 111 mL/min/{1.73_m2} (ref >59)

## 2022-06-28 LAB — CBC WITH DIFFERENTIAL/PLATELET
Basophils Absolute: 0.1 10*3/uL (ref 0.0–0.2)
Basos: 1 %
EOS (ABSOLUTE): 0 10*3/uL (ref 0.0–0.4)
Eos: 1 %
Hematocrit: 41 % (ref 34.0–46.6)
Hemoglobin: 14.1 g/dL (ref 11.1–15.9)
Immature Grans (Abs): 0 10*3/uL (ref 0.0–0.1)
Immature Granulocytes: 1 %
Lymphocytes Absolute: 2.4 10*3/uL (ref 0.7–3.1)
Lymphs: 29 %
MCH: 32 pg (ref 26.6–33.0)
MCHC: 34.4 g/dL (ref 31.5–35.7)
MCV: 93 fL (ref 79–97)
Monocytes Absolute: 0.6 10*3/uL (ref 0.1–0.9)
Monocytes: 7 %
Neutrophils Absolute: 5.3 10*3/uL (ref 1.4–7.0)
Neutrophils: 61 %
Platelets: 248 10*3/uL (ref 150–450)
RBC: 4.4 x10E6/uL (ref 3.77–5.28)
RDW: 11.8 % (ref 11.7–15.4)
WBC: 8.4 10*3/uL (ref 3.4–10.8)

## 2022-06-28 LAB — OXYCODONE/OXYMORPHONE, CONFIRM
OXYCODONE/OXYMORPH: POSITIVE — AB
OXYCODONE: >3000 ng/mL
OXYCODONE: POSITIVE — AB
OXYMORPHONE (GC/MS): 1054 ng/mL
OXYMORPHONE: POSITIVE — AB

## 2022-06-28 LAB — OPIATES CONFIRMATION, URINE: Opiates: NEGATIVE ng/mL

## 2022-06-28 LAB — DRUG SCREEN 764883 11+OXYCO+ALC+CRT-BUND
Amphetamines, Urine: NEGATIVE ng/mL
BENZODIAZ UR QL: NEGATIVE ng/mL
Barbiturate: NEGATIVE ng/mL
Cocaine (Metabolite): NEGATIVE ng/mL
Creatinine: 67.9 mg/dL (ref 20.0–300.0)
Ethanol: NEGATIVE %
Meperidine: NEGATIVE ng/mL
Methadone Screen, Urine: NEGATIVE ng/mL
Phencyclidine: NEGATIVE ng/mL
Propoxyphene: NEGATIVE ng/mL
Tramadol: NEGATIVE ng/mL
pH, Urine: 6.1 (ref 4.5–8.9)

## 2022-06-28 LAB — CANNABINOID CONFIRMATION, UR
CANNABINOIDS: POSITIVE — AB
Carboxy THC GC/MS Conf: >750 ng/mL

## 2022-07-05 NOTE — Telephone Encounter (Signed)
JCV ab drawn on 06/21/22 indeterminate, index: 0.20. Inhibition assay: negative.

## 2022-07-14 ENCOUNTER — Other Ambulatory Visit: Payer: Self-pay | Admitting: Neurology

## 2022-07-14 DIAGNOSIS — G35 Multiple sclerosis: Secondary | ICD-10-CM | POA: Diagnosis not present

## 2022-07-14 MED ORDER — OXYCODONE-ACETAMINOPHEN 5-325 MG PO TABS: 1.0000 | ORAL_TABLET | Freq: Four times a day (QID) | ORAL | 0 refills | Status: DC | PRN

## 2022-07-14 NOTE — Telephone Encounter (Signed)
Pt called needing a refill request on her oxyCODONE-acetaminophen (PERCOCET/ROXICET) 5-325 MG tablet sent in to the [facility] in [place]

## 2022-07-14 NOTE — Telephone Encounter (Signed)
Last seen 06/21/22 and next f/u 01/05/23. Per drug registry, last refilled 06/17/22 #120.

## 2022-07-26 ENCOUNTER — Other Ambulatory Visit: Payer: Medicare Other

## 2022-08-15 ENCOUNTER — Other Ambulatory Visit: Payer: Self-pay | Admitting: Neurology

## 2022-08-15 ENCOUNTER — Other Ambulatory Visit: Payer: Self-pay

## 2022-08-15 ENCOUNTER — Telehealth: Payer: Self-pay | Admitting: Neurology

## 2022-08-15 MED ORDER — OXYCODONE-ACETAMINOPHEN 5-325 MG PO TABS: 1.0000 | ORAL_TABLET | Freq: Four times a day (QID) | ORAL | 0 refills | Status: DC | PRN

## 2022-08-15 NOTE — Telephone Encounter (Signed)
Pt request refill for oxyCODONE-acetaminophen (PERCOCET/ROXICET) 5-325 MG tablet  at [facility] [phone]

## 2022-08-15 NOTE — Telephone Encounter (Signed)
Follow up scheduled on 01/05/23 with Amy Lomax.  Last filled per drug register 07/17/22 # 120 tablets   Rx pending for you to sign

## 2022-08-16 ENCOUNTER — Other Ambulatory Visit: Payer: Self-pay | Admitting: Neurology

## 2022-08-16 MED ORDER — OXYCODONE-ACETAMINOPHEN 5-325 MG PO TABS: 1.0000 | ORAL_TABLET | Freq: Four times a day (QID) | ORAL | 0 refills | Status: DC | PRN

## 2022-08-16 NOTE — Telephone Encounter (Signed)
Pt husband is calling. States  oxyCODONE-acetaminophen (PERCOCET/ROXICET) 5-325 MG tablet need to be sent to  [facility] [phone]

## 2022-08-16 NOTE — Telephone Encounter (Signed)
Dr.Sater you refilled this Rx yesterday to [place].   [facility] sent a message attached to Rx saying "Pharmacy comment: please send to patient's normal pharmacy [facility]. we do not have enough in stock to fill. "  Patient husband called today asking if Rx can be sent to [facility] also. I have Rx pending and set on [place] for you to re-send Rx.

## 2022-08-16 NOTE — Telephone Encounter (Signed)
[facility] sent a request asking Rx to be sent to [facility], Rx routed to Dr.Sater to approve Rx.

## 2022-08-17 ENCOUNTER — Other Ambulatory Visit: Payer: Self-pay | Admitting: Neurology

## 2022-09-05 DIAGNOSIS — G35 Multiple sclerosis: Secondary | ICD-10-CM | POA: Diagnosis not present

## 2022-09-13 ENCOUNTER — Other Ambulatory Visit: Payer: Self-pay | Admitting: Neurology

## 2022-09-13 MED ORDER — OXYCODONE-ACETAMINOPHEN 5-325 MG PO TABS: 1.0000 | ORAL_TABLET | Freq: Four times a day (QID) | ORAL | 0 refills | Status: DC | PRN

## 2022-09-13 NOTE — Telephone Encounter (Signed)
Pt husband called and requested a refill on oxyCODONE-acetaminophen (PERCOCET/ROXICET) 5-325 MG tablet . Should be sent to [facility] ON:[id number]

## 2022-09-13 NOTE — Telephone Encounter (Signed)
Pt last seen on 06/21/2022 Follow up scheduled on 12/16/22 Rx last filled on 08/16/22 # 120 tablet ( 30 day supply)  Rx pending for you to sign,

## 2022-10-10 ENCOUNTER — Other Ambulatory Visit: Payer: Self-pay | Admitting: Neurology

## 2022-10-10 MED ORDER — OXYCODONE-ACETAMINOPHEN 5-325 MG PO TABS: 1.0000 | ORAL_TABLET | Freq: Four times a day (QID) | ORAL | 0 refills | Status: DC | PRN

## 2022-10-10 NOTE — Telephone Encounter (Signed)
Last seen 06/21/22 and next f/u 01/05/23. Per drug registry, last refilled 09/14/22 #120.

## 2022-10-10 NOTE — Telephone Encounter (Signed)
Pt called. Requesting a refill on oxyCODONE-acetaminophen (PERCOCET/ROXICET) 5-325 MG tablet. Should be sent to [facility] BF:[id number] - [place], [place]

## 2022-10-10 NOTE — Addendum Note (Signed)
Addended by: Wyvonnia Lora on: 10/10/2022 10:44 AM   Modules accepted: Orders

## 2022-10-31 ENCOUNTER — Telehealth: Payer: Self-pay | Admitting: *Deleted

## 2022-10-31 NOTE — Telephone Encounter (Signed)
PA denied. Pt can continue to use goodrx coupon to fill rx. Faxed notice to pharmacy. Received fax confirmation.

## 2022-10-31 NOTE — Telephone Encounter (Signed)
Received fax from [facility] that PA was needed for Armodafinil  tab. I called [facility] and got pharmacy coverage info: ID: [id number], RXBIN: [id number], RXPCN: [id number], RXGRP: [id number], phone: [phone].   Submitted PA on covermymeds. Key: [id number]. Waiting on determination from [facility].

## 2022-11-07 ENCOUNTER — Telehealth: Payer: Self-pay | Admitting: Neurology

## 2022-11-07 NOTE — Telephone Encounter (Signed)
Pt husband stated pt prescription for natalizumab (TYSABRI) 300 MG/15ML injection have ran out at the hospital. He is requesting another prescription be sent over.

## 2022-11-07 NOTE — Telephone Encounter (Signed)
I called [facility] Phone: [phone]. Fax: [phone] to find out what is needed for the below. Spoke with Vikki Ports and was informed pt needs a new order faxed to them the other order has expired. Pt last infusion was 09/05/22, was suppose to come on 10/29/22, but order has expired. Vikki Ports states pt has been noncompliant with infusions. Dr.Sater is out of the office today,I will have new order faxed once he returned.

## 2022-11-08 NOTE — Telephone Encounter (Signed)
Confirmation received from fax.

## 2022-11-08 NOTE — Telephone Encounter (Signed)
Tysarbi order form filled out and placed in Dr.Sater to be signed orders. Once signed order will be faxed to [phone]

## 2022-11-09 ENCOUNTER — Other Ambulatory Visit: Payer: Self-pay | Admitting: Neurology

## 2022-11-09 ENCOUNTER — Other Ambulatory Visit: Payer: Self-pay

## 2022-11-09 MED ORDER — OXYCODONE-ACETAMINOPHEN 5-325 MG PO TABS: 1.0000 | ORAL_TABLET | Freq: Four times a day (QID) | ORAL | 0 refills | Status: DC | PRN

## 2022-11-09 NOTE — Telephone Encounter (Signed)
Last Seen 06/21/2022 Upcoming Appointment 01/05/2023  OxyCodone Last Dispensed 10/14/2022

## 2022-11-09 NOTE — Telephone Encounter (Signed)
Pt requesting a refill on oxyCODONE-acetaminophen (PERCOCET/ROXICET) 5-325 MG tablet. Should be sent to [facility] [phone]

## 2022-11-14 NOTE — Telephone Encounter (Signed)
Sabrina [name] @ [facility] is asking the order be faxed directly to her today at [phone]

## 2022-11-14 NOTE — Telephone Encounter (Signed)
Re-faxed order to alternate fax # provided. Received fax confirmation.

## 2022-11-17 NOTE — Telephone Encounter (Signed)
Late entry: [facility] called 11/16/22. Asking dx be added to order. I added and re-faxed to [phone]. Received fax confirmation. Violet asked it actually be faxed to: [phone]. Leandra Kern, RMA re-faxed, received fax confirmation.

## 2022-11-24 DIAGNOSIS — G35 Multiple sclerosis: Secondary | ICD-10-CM | POA: Diagnosis not present

## 2022-12-08 ENCOUNTER — Other Ambulatory Visit: Payer: Self-pay | Admitting: Neurology

## 2022-12-08 MED ORDER — OXYCODONE-ACETAMINOPHEN 5-325 MG PO TABS: 1.0000 | ORAL_TABLET | Freq: Four times a day (QID) | ORAL | 0 refills | Status: DC | PRN

## 2022-12-08 NOTE — Telephone Encounter (Signed)
Pt is needing a refill request for her oxyCODONE-acetaminophen (PERCOCET/ROXICET) 5-325 MG tablet [facility] in [place], [place]

## 2022-12-08 NOTE — Telephone Encounter (Signed)
Pt last seen on 06/21/22 "She will continue her medications including clonazepam for anxiety and spasms, oxycodone for pain and armodafinil for fatigue and sleepiness.   Follow up scheduled on 01/05/23 Last filled on 11/13/22 #120 tablets (30 day supply) Rx pending to be signed

## 2022-12-20 DIAGNOSIS — M545 Low back pain, unspecified: Secondary | ICD-10-CM | POA: Diagnosis not present

## 2022-12-26 ENCOUNTER — Other Ambulatory Visit: Payer: Self-pay | Admitting: Neurology

## 2022-12-26 MED ORDER — ARMODAFINIL 200 MG PO TABS: 1.0000 | ORAL_TABLET | Freq: Every day | ORAL | 5 refills | Status: DC

## 2022-12-26 NOTE — Telephone Encounter (Signed)
Pt husband requesting a refill on Armodafinil 200 MG TABS. Should be sent to  [facility] [phone]

## 2022-12-26 NOTE — Telephone Encounter (Signed)
Pt last seen 06/21/22 and next follow up 01/05/23.  Last refilled armodafinil 11/30/22 #30.

## 2023-01-04 ENCOUNTER — Telehealth: Payer: Self-pay | Admitting: Family Medicine

## 2023-01-05 ENCOUNTER — Ambulatory Visit (INDEPENDENT_AMBULATORY_CARE_PROVIDER_SITE_OTHER): Payer: Medicare Other | Admitting: Family Medicine

## 2023-01-05 ENCOUNTER — Encounter: Payer: Self-pay | Admitting: Family Medicine

## 2023-01-05 ENCOUNTER — Other Ambulatory Visit: Payer: Self-pay | Admitting: Neurology

## 2023-01-05 VITALS — BP 131/78 | HR 76 | Ht 64.0 in | Wt 85.0 lb

## 2023-01-05 DIAGNOSIS — R636 Underweight: Secondary | ICD-10-CM | POA: Diagnosis not present

## 2023-01-05 DIAGNOSIS — G8929 Other chronic pain: Secondary | ICD-10-CM

## 2023-01-05 DIAGNOSIS — F09 Unspecified mental disorder due to known physiological condition: Secondary | ICD-10-CM

## 2023-01-05 DIAGNOSIS — R5383 Other fatigue: Secondary | ICD-10-CM

## 2023-01-05 DIAGNOSIS — M62838 Other muscle spasm: Secondary | ICD-10-CM | POA: Diagnosis not present

## 2023-01-05 DIAGNOSIS — Z79899 Other long term (current) drug therapy: Secondary | ICD-10-CM | POA: Diagnosis not present

## 2023-01-05 DIAGNOSIS — F32A Depression, unspecified: Secondary | ICD-10-CM | POA: Diagnosis not present

## 2023-01-05 DIAGNOSIS — G35 Multiple sclerosis: Secondary | ICD-10-CM

## 2023-01-05 DIAGNOSIS — R269 Unspecified abnormalities of gait and mobility: Secondary | ICD-10-CM

## 2023-01-05 DIAGNOSIS — R208 Other disturbances of skin sensation: Secondary | ICD-10-CM | POA: Diagnosis not present

## 2023-01-05 MED ORDER — DULOXETINE HCL 60 MG PO CPEP: 60.0000 mg | ORAL_CAPSULE | Freq: Every day | ORAL | 1 refills | Status: DC

## 2023-01-05 NOTE — Patient Instructions (Signed)
Below is our plan:  We will continue current treatment plan. Please call to schedule MRI. GI [phone]. Let me know if you have any trouble. Continue follow up with orthopedics.   Please make sure you are staying well hydrated. I recommend 50-60 ounces daily. Well balanced diet and regular exercise encouraged. Consistent sleep schedule with 6-8 hours recommended.   Please continue follow up with care team as directed.   Follow up with Dr Epimenio Foot in 4 months   You may receive a survey regarding today's visit. I encourage you to leave honest feed back as I do use this information to improve patient care. Thank you for seeing me today!

## 2023-01-05 NOTE — Progress Notes (Signed)
Chief Complaint  Patient presents with   Follow-up    Pt in room 2. Mother in room. Here for MS follow up. No recent fall, pt reports balance is off, back and neck pain worsened since June. Reports pain makes it hard to stand up. Needs refills on Rx. Wants to discuss pain management due to pain.       HISTORY OF PRESENT ILLNESS:  01/05/23 ALL:  Sabrina [name] is a 42 y.o. female here today for follow up for RRMS. She continues Tysabri infusions. Labs have been stable. Last JCV 0.20, assay negative. MRI in 03/2020 showed stable MS and progressing cerebral atrophy. Repeat imaging ordered at last visit with Dr Epimenio Foot but she didn't hear back to schedule.   She feels that MS symptoms are about the same. She denies new or exacerbating symptoms. No falls. Gait is stable. She uses Rolator for longer distances (greater than 10 minutes). She continues to have significant low back, left leg and buttock pain. Oxycodone 5/325mg  QID helps a little. She is no longer taking cyclobenzaprine. It made her too groggy. She was scared to take tizanidine but can't remember why.   She was seen in consult with ortho at [facility]. She was referred to spine specialist/surgeon and to PT. She has follow up with them next month.   Mood is stable. She continues duloxetine 60mg  daily and clonazepam 0.5mg  up to TID (usually 1-2 times a day) helps with mood, sleep, and pain. Memory is stable. No significant changes. She is sleeping well. She sleeps for about 7-8 hours a night. Appetite is ok. She was restarted on Megace. She was 83lbs 06/2022, now 85lbs. She continues to drink [employer] (sometimes adds ice cream) 1-2 times daily.   She feels fatigue is about the same. She continues armodafinil 200mg  daily. She feels that she would not be able to function without it. She tries to stay active around the home. She does have have an exercise regimen.   She feels bladder and bowel habits are stable, no significant  changes. She did not wish to take oxybutynin.   She has had more blurred vision, bilaterally. She plans to schedule eye exam soon. She continues to read without significant difficulty.    HISTORY (copied from Dr Bonnita Hollow previous note)  Sabrina [name] is a 42 y.o. woman with multiple sclerosis and chronic pain.      Update 06/21/2022: She is on Tysabri for RRMS.   She tolerates it well.   She had issues with MCD/MCR and she missed 2 doses.    She has no recent exacerbation.  She has been JCV antibody negative.  Her last test was 01/05/2022 and was negative (0.19).   She continues to experience a lot of pain.      She notes walking is a little off balanced - worse in wind.   She has a few stumbles but no falls.    She needs to hold the bannister on stairs.  She notes some weakness on her left.   She has some numbness and tingling in her left leg.  She gets sciatica on her left side.   She has urinary urgency and  hesitancy.   She uses Depends at night for once a week incontinence.    She notes vision is a little worse and we discussed her seeing an ophthalmologist for eye exam and new glasses   She has fatigue and somnolence, helped by modafinil.  And she tolerates it  well.   She sleeps well at niht and does not nap.     She has persistent left buttock and leg pain     Riding in a car is difficult .  She needs to sit on a pillow and often stands p to shift or leans forward.   Injections (piriformis) did not help. Botox into the piriformis muscle only helped a week.   Pain has been present for many years.  MRI of the lumbar spine in 2016 was normal.   She is on Percocet 5 mg qid with some benefit though she feels it does not help sometimes she takes it..   She is also on lamotrigine, cyclobenzaprine (takes prn), clonazepam 0.5 once a day and occassionally at night.   The PDMP was reviewed and she is not getting med's from other sources and does not have drug seeking behavior.      She weights less  than last time (83 pounds now)   She is off Megace and feels her appetite improved on its own.   She feels she is eating well.         MS History:  She had right optic neuritis in December 2012 and had an MRI of the brain showing optic nerve inflammation and many white matter spots.    She saw Dr. Macon Large in [place].   She was started on Copaxone.  She likely had an exacerbation in 2013 when she had trouble walking x 2 months.   She received a few days of IV Solu-Medrol.  Last year, she also received a few days of IV Solu-Medrol but she is uncertain   She felt she did well for the first 2 years but switched to Gilenya because she was tired of the shots last year.   At first, she tolerated it well but then her depression got much worse and she had some stomach issues so she returned to Copaxone last month (20 mg daily).   MRI early 2016 showed multiple old MS plaques and 1  Enhancing focus in the right frontal lobe prompting change to Tysabi.   She has been on Tysabri since 2016 and has been JCV Ab negative.   MRI Brain/spine 02/2017:  MRI of the brain and MRI of the thoracic spine performed during her recent hospitalization. The MRI of the brain shows many T2/FLAIR hyperintense foci in the periventricular, juxtacortical and deep white matter. Her to be any change when compared to her previous MRI. The MRI of the thoracic spine shows multiple T2 hyperintense lesions. The T6-T7 lesion is fairly large and there could be slight expansion of the spinal cord. However, there is no enhancement.       MRI brain 04/02/2020 was unchanged.      REVIEW OF SYSTEMS: Out of a complete 14 system review of symptoms, the patient complains only of the following symptoms, chronic pain, decreased appetite, discoloration of posterior legs, blurred vision and all other reviewed systems are negative.    ALLERGIES: Allergies  Allergen Reactions   Amoxicillin Anaphylaxis   Penicillins Anaphylaxis   Sulfa Antibiotics Other  (See Comments)    unknown   Codeine Nausea And Vomiting     HOME MEDICATIONS: Outpatient Medications Prior to Visit  Medication Sig Dispense Refill   Armodafinil 200 MG TABS Take 1 tablet (200 mg total) by mouth daily. 30 tablet 5   clonazePAM (KLONOPIN) 0.5 MG tablet Take 1 tablet (0.5 mg total) by mouth 3 (three) times daily as needed.  60 tablet 5   feeding supplement, ENSURE ENLIVE, (ENSURE ENLIVE) LIQD Take 237 mLs by mouth 2 (two) times daily between meals. 237 mL 12   megestrol (MEGACE) 40 MG tablet Take 1 tablet (40 mg total) by mouth 2 (two) times daily. 60 tablet 11   naloxone (NARCAN) 0.4 MG/ML injection Use as directed 1 mL 1   natalizumab (TYSABRI) 300 MG/15ML injection Inject 15 mLs (300 mg total) into the vein every 30 (thirty) days. 15 mL 11   oxyCODONE-acetaminophen (PERCOCET/ROXICET) 5-325 MG tablet Take 1 tablet by mouth 4 (four) times daily as needed for severe pain. One po qid prn 120 tablet 0   ranitidine (ZANTAC) 150 MG tablet Take 150 mg by mouth 2 (two) times daily.     tizanidine (ZANAFLEX) 2 MG capsule Take 1 capsule (2 mg total) by mouth 3 (three) times daily. 90 capsule 11   VITAMIN D PO Take 1,000 Units by mouth daily.     DULoxetine (CYMBALTA) 60 MG capsule Take 1 capsule by mouth once daily 90 capsule 1   No facility-administered medications prior to visit.     PAST MEDICAL HISTORY: Past Medical History:  Diagnosis Date   Chronic pain syndrome    Depression    Fibromyalgia    GAD (generalized anxiety disorder)    Headache    Multiple sclerosis (HCC) 10/28/2014   Obsessive compulsive disorder    Vision abnormalities      PAST SURGICAL HISTORY: Past Surgical History:  Procedure Laterality Date   NO PAST SURGERIES       FAMILY HISTORY: Family History  Problem Relation Age of Onset   Healthy Mother    Diabetes type II Father    Prostate cancer Father      SOCIAL HISTORY: Social History   Socioeconomic History   Marital status:  Married    Spouse name: Not on file   Number of children: Not on file   Years of education: Not on file   Highest education level: Not on file  Occupational History   Not on file  Tobacco Use   Smoking status: Every Day    Packs/day: .5    Types: Cigarettes   Smokeless tobacco: Never  Vaping Use   Vaping Use: Never used  Substance and Sexual Activity   Alcohol use: No    Alcohol/week: 0.0 standard drinks of alcohol   Drug use: No   Sexual activity: Not on file  Other Topics Concern   Not on file  Social History Narrative   Not on file   Social Determinants of Health   Financial Resource Strain: Not on file  Food Insecurity: Not on file  Transportation Needs: Not on file  Physical Activity: Not on file  Stress: Not on file  Social Connections: Not on file  Intimate Partner Violence: Not on file      PHYSICAL EXAM  Vitals:   01/05/23 1440  BP: 131/78  Pulse: 76  Weight: 85 lb (38.6 kg)  Height: 5\' 4"  (1.626 m)     Body mass index is 14.59 kg/m.   Generalized: thin, in no acute distress  Cardiology: normal rate and rhythm, no murmur auscultated  Respiratory: clear to auscultation bilaterally    Neurological examination  Mentation: Alert oriented to time, place, history taking. Follows all commands speech and language fluent Cranial nerve II-XII: Pupils were equal round reactive to light. Extraocular movements were full, visual field were full on confrontational test. Facial sensation and strength were normal.  Head turning and shoulder shrug  were normal and symmetric. Motor: The motor testing reveals 5 over 5 strength of all 4 extremities with exception of 4/5 left hip flexion  Sensory: Sensory testing is intact to soft touch on all 4 extremities. No evidence of extinction is noted.  Coordination: Cerebellar testing reveals good finger-nose-finger and heel-to-shin bilaterally.  Gait and station: Kyphosis noted, unable to tolerate sitting upright due to back  pain, Gait is wide but stable without assistive device. Tandem gait is mildly wide and unsteady  Reflexes: Deep tendon reflexes brisk bilaterally     DIAGNOSTIC DATA (LABS, IMAGING, TESTING) - I reviewed patient records, labs, notes, testing and imaging myself where available.  Lab Results  Component Value Date   WBC 8.4 06/21/2022   HGB 14.1 06/21/2022   HCT 41.0 06/21/2022   MCV 93 06/21/2022   PLT 248 06/21/2022      Component Value Date/Time   NA 139 06/21/2022 1412   K 4.3 06/21/2022 1412   CL 101 06/21/2022 1412   CO2 26 06/21/2022 1412   GLUCOSE 88 06/21/2022 1412   GLUCOSE 138 (H) 02/23/2017 0452   BUN 7 06/21/2022 1412   CREATININE 0.67 06/21/2022 1412   CALCIUM 9.2 06/21/2022 1412   PROT 6.7 06/21/2022 1412   ALBUMIN 4.6 06/21/2022 1412   AST 13 06/21/2022 1412   ALT 8 06/21/2022 1412   ALKPHOS 96 06/21/2022 1412   BILITOT 0.3 06/21/2022 1412   GFRNONAA 110 08/20/2020 1345   GFRAA 126 08/20/2020 1345   No results found for: "CHOL", "HDL", "LDLCALC", "LDLDIRECT", "TRIG", "CHOLHDL" No results found for: "HGBA1C" No results found for: "VITAMINB12" Lab Results  Component Value Date   TSH 1.239 02/21/2017        No data to display               No data to display           ASSESSMENT AND PLAN  42 y.o. year old female  has a past medical history of Chronic pain syndrome, Depression, Fibromyalgia, GAD (generalized anxiety disorder), Headache, Multiple sclerosis (HCC) (10/28/2014), Obsessive compulsive disorder, and Vision abnormalities. here with   Relapsing remitting multiple sclerosis (HCC) - Plan: Stratify JCV(TM) Ab w/Index, CBC with Differential/Platelets  High risk medication use  Cognitive dysfunction  Dysesthesia  Other chronic pain  Gait disorder  Underweight  Muscle spasticity  Depression, unspecified depression type  Other fatigue  Sabrina [name] feels that MS symptoms are fairly stable but continues to have significant low back  pain with radicular symptoms. She will continue Tysabri infusions every 4 weeks. We will update labs today. MRI stable in 03/2020. She will call to schedule imaging. Continue oxycodone QID. PDMP reviewed and shows appropriate refills. Last filled 12/13/2022. She will continue clonazepam, duloxetine, and modafinil as prescribed. She was encouraged to continue healthy lifestyle habits. Fall precautions advised. She will follow up with Dr Epimenio Foot in 4-6 months.   Orders Placed This Encounter  Procedures   Stratify JCV(TM) Ab w/Index   CBC with Differential/Platelets     Meds ordered this encounter  Medications   DULoxetine (CYMBALTA) 60 MG capsule    Sig: Take 1 capsule (60 mg total) by mouth daily.    Dispense:  90 capsule    Refill:  1    Order Specific Question:   Supervising Provider    Answer:   Anson Fret [[id number]]    Shawnie Dapper, MSN, FNP-C 01/05/2023, 3:50 PM  [facility]  [address], [place], [place] [address] [phone]

## 2023-01-05 NOTE — Telephone Encounter (Signed)
Last seen today, ok to continue.

## 2023-01-06 LAB — CBC WITH DIFFERENTIAL/PLATELET
EOS (ABSOLUTE): 0.1 10*3/uL (ref 0.0–0.4)
Eos: 2 %
Hematocrit: 38.2 % (ref 34.0–46.6)
Hemoglobin: 12.6 g/dL (ref 11.1–15.9)
Immature Granulocytes: 1 %
Monocytes: 8 %
Neutrophils Absolute: 3 10*3/uL (ref 1.4–7.0)
RBC: 4.02 x10E6/uL (ref 3.77–5.28)
WBC: 6.5 10*3/uL (ref 3.4–10.8)

## 2023-01-09 ENCOUNTER — Telehealth: Payer: Self-pay | Admitting: *Deleted

## 2023-01-09 ENCOUNTER — Other Ambulatory Visit: Payer: Self-pay | Admitting: Family Medicine

## 2023-01-09 MED ORDER — OXYCODONE-ACETAMINOPHEN 5-325 MG PO TABS: 1.0000 | ORAL_TABLET | Freq: Four times a day (QID) | ORAL | 0 refills | Status: DC | PRN

## 2023-01-09 NOTE — Telephone Encounter (Signed)
I called pt and could not LM.  Some of her labs came back normal, still awaiting JCV.

## 2023-01-09 NOTE — Telephone Encounter (Signed)
Pt last seen on 01/05/23 Follow up scheduled on 05/10/23 Last filled on 12/13/22 #120 tablets (30 day supply) Rx pending to be signed

## 2023-01-09 NOTE — Telephone Encounter (Signed)
Pt is requesting a refill for oxyCODONE-acetaminophen (PERCOCET/ROXICET) 5-325 MG tablet .  Pharmacy: [facility] [phone]

## 2023-01-12 LAB — CBC WITH DIFFERENTIAL/PLATELET
Basophils Absolute: 0.1 10*3/uL (ref 0.0–0.2)
Basos: 1 %
Immature Grans (Abs): 0 10*3/uL (ref 0.0–0.1)
Lymphocytes Absolute: 2.8 10*3/uL (ref 0.7–3.1)
Lymphs: 43 %
MCH: 31.3 pg (ref 26.6–33.0)
MCHC: 33 g/dL (ref 31.5–35.7)
MCV: 95 fL (ref 79–97)
Monocytes Absolute: 0.5 10*3/uL (ref 0.1–0.9)
Neutrophils: 45 %
Platelets: 246 10*3/uL (ref 150–450)
RDW: 12 % (ref 11.7–15.4)

## 2023-01-12 LAB — STRATIFY JCV(TM) AB W/INDEX
JCV Antibody: NEGATIVE
JCV Index Value: 0.12

## 2023-01-29 ENCOUNTER — Telehealth: Payer: Self-pay

## 2023-01-29 ENCOUNTER — Other Ambulatory Visit (HOSPITAL_COMMUNITY): Payer: Self-pay

## 2023-01-29 NOTE — Telephone Encounter (Signed)
Pharmacy Patient Advocate Encounter   Received notification from CoverMyMeds that prior authorization for Armodafinil 200MG  tablets is required/requested.   Insurance verification completed.   The patient is insured through [employer] .   Per test claim: PA submitted to [employer] [facility] via CoverMyMeds Key/confirmation #/EOC  [id number]  Status is pending

## 2023-01-30 ENCOUNTER — Other Ambulatory Visit: Payer: Self-pay

## 2023-01-31 NOTE — Telephone Encounter (Signed)
Pharmacy Patient Advocate Encounter  Received notification from [employer] [facility] [employer] that Prior Authorization for Armodafinil 200MG  tablets  has been DENIED. Please advise how you'd like to proceed. Full denial letter will be uploaded to the media tab. See denial reason below.  We denied this request because the information provided by your prescriber did not meet the requirements for covering this medication. Your plan does not allow coverage of this medication based on your prescriber answering No to the following question(s): Does the patient have a diagnosis of excessive sleepiness associated with narcolepsy? Does the patient have a diagnosis of excessive sleepiness associated with Shift Work Disorder (SWD)? Does the patient have a diagnosis of excessive sleepiness associated with obstructive sleep apnea  (OSA)?  PA #/Case ID/Reference #: [id number]   Please be advised we currently do not have a Pharmacist to review denials, therefore you will need to process appeals accordingly as needed. Thanks for your support at this time. Contact for appeals are as follows: Phone: [phone], Fax: [phone]

## 2023-02-01 ENCOUNTER — Other Ambulatory Visit (HOSPITAL_COMMUNITY): Payer: Self-pay

## 2023-02-01 ENCOUNTER — Other Ambulatory Visit: Payer: Medicare Other

## 2023-02-01 NOTE — Telephone Encounter (Signed)
Called and spoke to Sabrina [name]:  Epworth Sleepiness Scale 0= would never doze 1= slight chance of dozing 2= moderate chance of dozing 3= high chance of dozing  Sitting and reading:0 Watching TV:1 Sitting inactive in a public place (ex. Theater or meeting):0 As a passenger in a car for an hour without a break:1 Lying down to rest in the afternoon:3 Sitting and talking to someone:0 Sitting quietly after lunch (no alcohol):0 In a car, while stopped in traffic:0 Total:5 Routing to provider

## 2023-02-01 NOTE — Telephone Encounter (Signed)
How would you like to proceed since she doesn't have osa or excessive sleepiness the only thing I saw was other fatigue on problem list

## 2023-02-01 NOTE — Telephone Encounter (Signed)
Monica,  Hit renew on cmm it should allow you.   Try this time for: hypersomnolence  Thanks,  Anntionette Madkins

## 2023-02-01 NOTE — Telephone Encounter (Signed)
Unable to reach pt via home number.

## 2023-02-02 ENCOUNTER — Other Ambulatory Visit (HOSPITAL_COMMUNITY): Payer: Self-pay

## 2023-02-02 DIAGNOSIS — G4710 Hypersomnia, unspecified: Secondary | ICD-10-CM | POA: Insufficient documentation

## 2023-02-02 NOTE — Telephone Encounter (Signed)
1st attempt  Unable to leave msg that the appeal will need a signature from the pt giving [place] permission to advocate for armodafinil on her behalf.   Thhe following letter was signed by amy lomax np and placed at the front:

## 2023-02-02 NOTE — Telephone Encounter (Signed)
   It will not let you resubmit due to the previous denial.

## 2023-02-02 NOTE — Telephone Encounter (Signed)
Tried to reach pt on main #. Received message stating call cannot be completed at this time. Will try again later.

## 2023-02-06 NOTE — Telephone Encounter (Signed)
I called and could not LM for pt.

## 2023-02-07 ENCOUNTER — Encounter: Payer: Self-pay | Admitting: *Deleted

## 2023-02-07 NOTE — Telephone Encounter (Signed)
I called but could not LM.  I mailed pt a letter to call us.

## 2023-02-08 ENCOUNTER — Other Ambulatory Visit: Payer: Self-pay | Admitting: Family Medicine

## 2023-02-08 MED ORDER — OXYCODONE-ACETAMINOPHEN 5-325 MG PO TABS: 1.0000 | ORAL_TABLET | Freq: Four times a day (QID) | ORAL | 0 refills | Status: DC | PRN

## 2023-02-08 NOTE — Telephone Encounter (Signed)
Pt last seen on 01/05/23 Follow up scheduled on 05/10/23 Last filled 01/11/23 #120 tablets (30 day supply) Rx pending to be signed

## 2023-02-08 NOTE — Telephone Encounter (Signed)
Pt is requesting a refill for oxyCODONE-acetaminophen (PERCOCET/ROXICET) 5-325 MG tablet .  Pharmacy: [facility] [phone]

## 2023-02-13 ENCOUNTER — Inpatient Hospital Stay: Admission: RE | Admit: 2023-02-13 | Payer: Medicare Other | Source: Ambulatory Visit

## 2023-02-13 DIAGNOSIS — Z880 Allergy status to penicillin: Secondary | ICD-10-CM | POA: Diagnosis not present

## 2023-02-13 DIAGNOSIS — Z885 Allergy status to narcotic agent status: Secondary | ICD-10-CM | POA: Diagnosis not present

## 2023-02-13 DIAGNOSIS — G35 Multiple sclerosis: Secondary | ICD-10-CM | POA: Diagnosis not present

## 2023-02-13 DIAGNOSIS — Z881 Allergy status to other antibiotic agents status: Secondary | ICD-10-CM | POA: Diagnosis not present

## 2023-02-13 DIAGNOSIS — Z882 Allergy status to sulfonamides status: Secondary | ICD-10-CM | POA: Diagnosis not present

## 2023-03-09 ENCOUNTER — Other Ambulatory Visit: Payer: Self-pay

## 2023-03-09 ENCOUNTER — Other Ambulatory Visit: Payer: Self-pay | Admitting: Family Medicine

## 2023-03-09 MED ORDER — OXYCODONE-ACETAMINOPHEN 5-325 MG PO TABS: 1.0000 | ORAL_TABLET | Freq: Four times a day (QID) | ORAL | 0 refills | Status: DC | PRN

## 2023-03-09 NOTE — Addendum Note (Signed)
Addended by: Danne Harbor on: 03/09/2023 05:13 PM   Modules accepted: Orders

## 2023-03-09 NOTE — Telephone Encounter (Signed)
Pt is requesting a refill for oxyCODONE-acetaminophen (PERCOCET/ROXICET) 5-325 MG tablet .  Pharmacy: [facility] [phone]

## 2023-03-09 NOTE — Telephone Encounter (Addendum)
Pt Last Seen 01/05/2023 Upcoming Appointment 05/10/2023  Request has been sent to Dr. Epimenio Foot

## 2023-03-09 NOTE — Telephone Encounter (Signed)
Requested Prescriptions   Pending Prescriptions Disp Refills   oxyCODONE-acetaminophen (PERCOCET/ROXICET) 5-325 MG tablet 120 tablet 0    Sig: Take 1 tablet by mouth 4 (four) times daily as needed for severe pain. One po qid prn   Last seen 01/05/23, next appt 05/10/23 Dispenses   Routing to provider to fill Dispensed Days Supply Quantity Provider Pharmacy  OXYCOD/APAP 5-325MG  TAB 02/11/2023 30 120 tablet Sater, Pearletha Furl, MD [facility] [phone]...  OXYCOD/APAP 5-325MG  TAB 01/11/2023 30 120 tablet Asa Lente, MD [facility] [phone]...  OXYCOD/APAP 5-325MG  TAB 12/13/2022 30 120 tablet Asa Lente, MD [facility] [phone]...  OXYCOD/APAP 5-325MG  TAB 11/13/2022 30 120 tablet Asa Lente, MD [facility] [phone]...  OXYCOD/APAP 5-325MG  TAB 10/14/2022 30 120 tablet Asa Lente, MD [facility] [phone]...  OXYCOD/APAP 5-325MG  TAB 09/14/2022 30 120 tablet Asa Lente, MD [facility] [phone]...  OXYCOD/APAP 5-325MG  TAB 08/16/2022 30 120 tablet Asa Lente, MD [facility] [phone]...  OXYCOD/APAP 5-325MG  TAB 07/17/2022 30 120 tablet Huston Foley, MD [facility] [phone]...  OXYCOD/APAP 5-325MG  TAB 06/17/2022 30 120 tablet Asa Lente, MD [facility] [phone]...  OXYCOD/APAP 5-325MG  TAB 05/18/2022 30 120 tablet Asa Lente, MD [facility] [phone]...  OXYCOD/APAP 5-325MG  TAB 04/18/2022 30 120 tablet Dohmeier, Porfirio Mylar, MD [facility] #...  OXYCOD/APAP 5-325MG  TAB 03/19/2022 30 120 tablet Sater, Pearletha Furl, MD [facility] DR[facility] STORE #.Marland KitchenMarland Kitchen

## 2023-03-21 DIAGNOSIS — M4000 Postural kyphosis, site unspecified: Secondary | ICD-10-CM | POA: Diagnosis not present

## 2023-03-28 ENCOUNTER — Ambulatory Visit
Admission: RE | Admit: 2023-03-28 | Discharge: 2023-03-28 | Disposition: A | Discharge status: Home / Self Care | Payer: Medicare Other | Source: Ambulatory Visit | Attending: Neurology | Admitting: Neurology

## 2023-03-28 DIAGNOSIS — G35D Multiple sclerosis, unspecified: Secondary | ICD-10-CM

## 2023-03-28 DIAGNOSIS — G35 Multiple sclerosis: Secondary | ICD-10-CM

## 2023-03-28 MED ORDER — GADOPICLENOL 0.5 MMOL/ML IV SOLN
5.0000 mL | Freq: Once | INTRAVENOUS | Status: AC | PRN
  Administered 2023-03-28: 5 mL via INTRAVENOUS

## 2023-04-10 ENCOUNTER — Other Ambulatory Visit: Payer: Self-pay | Admitting: Family Medicine

## 2023-04-10 MED ORDER — OXYCODONE-ACETAMINOPHEN 5-325 MG PO TABS: 1.0000 | ORAL_TABLET | Freq: Four times a day (QID) | ORAL | 0 refills | Status: DC | PRN

## 2023-04-10 NOTE — Telephone Encounter (Signed)
Dr. Epimenio Foot- please e-scribe rx  Last seen 01/05/23 and next f/u 05/10/23. Last refilled oxycodone-acetaminophen 03/13/2023 #120.

## 2023-04-10 NOTE — Telephone Encounter (Signed)
Pt is requesting a refill for oxyCODONE-acetaminophen (PERCOCET/ROXICET) 5-325 MG tablet .  Pharmacy: [facility] [phone]

## 2023-05-05 DIAGNOSIS — G35 Multiple sclerosis: Secondary | ICD-10-CM | POA: Diagnosis not present

## 2023-05-10 ENCOUNTER — Encounter: Payer: Self-pay | Admitting: Neurology

## 2023-05-10 ENCOUNTER — Ambulatory Visit (INDEPENDENT_AMBULATORY_CARE_PROVIDER_SITE_OTHER): Payer: Medicare Other | Admitting: Neurology

## 2023-05-10 ENCOUNTER — Telehealth: Payer: Self-pay | Admitting: *Deleted

## 2023-05-10 VITALS — BP 120/72 | HR 72

## 2023-05-10 DIAGNOSIS — Z79899 Other long term (current) drug therapy: Secondary | ICD-10-CM | POA: Diagnosis not present

## 2023-05-10 DIAGNOSIS — G8929 Other chronic pain: Secondary | ICD-10-CM | POA: Diagnosis not present

## 2023-05-10 DIAGNOSIS — M5432 Sciatica, left side: Secondary | ICD-10-CM | POA: Diagnosis not present

## 2023-05-10 DIAGNOSIS — F09 Unspecified mental disorder due to known physiological condition: Secondary | ICD-10-CM | POA: Diagnosis not present

## 2023-05-10 DIAGNOSIS — G35 Multiple sclerosis: Secondary | ICD-10-CM | POA: Diagnosis not present

## 2023-05-10 MED ORDER — METHOCARBAMOL 500 MG PO TABS: 500.0000 mg | ORAL_TABLET | Freq: Three times a day (TID) | ORAL | 5 refills | Status: DC | PRN

## 2023-05-10 MED ORDER — OXYCODONE-ACETAMINOPHEN 5-325 MG PO TABS: 1.0000 | ORAL_TABLET | Freq: Four times a day (QID) | ORAL | 0 refills | Status: DC | PRN

## 2023-05-10 NOTE — Telephone Encounter (Signed)
Placed JCV lab in [facility] lock box for routine lab pick up. Results pending. 

## 2023-05-10 NOTE — Progress Notes (Signed)
u  [facility]  PATIENT: Sabrina [name] DOB: 05/16/1981    _________________________________   HISTORICAL  CHIEF COMPLAINT:  Chief Complaint  Patient presents with   Follow-up    Pt in room 1. Mother in room.  Here for MS follow up, DMT: Tysabri. Pt reports lots of pain in lower back and left leg and hips. Pt reports being fatigue. Pt reports stabbing neck pain, hurts more when looking up.    HISTORY OF PRESENT ILLNESS:  Sabrina [name] is a 42 y.o. woman with multiple sclerosis and chronic pain.     Update 05/10/2023: She is on Tysabri for RRMS.   She tolerates it well.   She had issues with MCD/MCR and she missed 2 doses.    She has no recent exacerbation.  She has been JCV antibody negative.  Her last test was 01/05/23 and was negative (0.12).        She feels gait is worse and she gets off balanced easily.   Stumbles but no falls.   She needs to hold the bannister on stairs.  She notes some weakness on her left.   She has some numbness and tingling in her left leg.  She gets sciatica on her left side.     She has urinary urgency and urinary hesitancy.   She uses Depends at night for once a week incontinence.      She notes vision is a little worse and we discussed her seeing an ophthalmologist for eye exam and new glasses.  She has fatigue and somnolence, helped by armodafinil.  And she tolerates it well.   She sleeps well at niht and does not nap.    She continues to experience a lot of pain.   .   She has persistent left buttock and leg pain     Riding in a car is difficult .  She needs to sit on a pillow and often stands up to shift or leans forward.   Injections (piriformis) did not help. Botox into the piriformis muscle only helped a week.   She has seen orthopedics recently for her spinal kyphosis (Pain has been present for many years.  MRI of the lumbar spine in 2016 was normal.  She is on Percocet 5 mg qid with some benefit though she feels it does not  help sometimes she takes it..   She is also on lamotrigine, cyclobenzaprine (takes prn), clonazepam 0.5 once a day and occassionally at night.  Baclofen caused sleepiness.   Gabapentin, Lyrica and Tegretol were not well tolerated.  Cyclobenzaprine The PDMP was reviewed and she is not getting med's from other sources and does not have drug seeking behavior.     She weights less than last time (85 pounds now).   She was 83 last visit with me.     She is off Megace and feels her appetite improved on its own.   She feels she is eating well.        MS History:  She had right optic neuritis in December 2012 and had an MRI of the brain showing optic nerve inflammation and many white matter spots.    She saw Dr. Macon Large in [place].   She was started on Copaxone.  She likely had an exacerbation in 2013 when she had trouble walking x 2 months.   She received a few days of IV Solu-Medrol.  Last year, she also received a few days of IV Solu-Medrol but she is uncertain  She felt she did well for the first 2 years but switched to Gilenya because she was tired of the shots last year.   At first, she tolerated it well but then her depression got much worse and she had some stomach issues so she returned to Copaxone last month (20 mg daily).   MRI early 2016 showed multiple old MS plaques and 1  Enhancing focus in the right frontal lobe prompting change to Tysabi.   She has been on Tysabri since 2016 and has been JCV Ab negative.  MRI Brain/spine 02/2017:  MRI of the brain and MRI of the thoracic spine performed during her recent hospitalization. The MRI of the brain shows many T2/FLAIR hyperintense foci in the periventricular, juxtacortical and deep white matter. Her to be any change when compared to her previous MRI. The MRI of the thoracic spine shows multiple T2 hyperintense lesions. The T6-T7 lesion is fairly large and there could be slight expansion of the spinal cord. However, there is no enhancement.      MRI brain  04/02/2020 was unchanged.    MR brain 03/28/2023 was unchanged       REVIEW OF SYSTEMS: Constitutional: No fevers, chills, sweats, or change in appetite.  Notes a lot of fatigue Eyes: No visual changes, double vision, eye pain Ear, nose and throat: No hearing loss, ear pain, nasal congestion, sore throat Cardiovascular: No chest pain, palpitations Respiratory:  No shortness of breath at rest or with exertion.   No wheezes GastrointestinaI: No nausea, vomiting, diarrhea, abdominal pain, fecal incontinence Genitourinary:  as above. Musculoskeletal:  No neck pain, back pain Integumentary: No rash, pruritus, skin lesions Neurological: as above Psychiatric: Notes some depression and anxiety Endocrine: No palpitations, diaphoresis, change in appetite, change in weigh or increased thirst Hematologic/Lymphatic:  No anemia, purpura, petechiae. Allergic/Immunologic: No itchy/runny eyes, nasal congestion, recent allergic reactions, rashes  ALLERGIES: Allergies  Allergen Reactions   Amoxicillin Anaphylaxis   Penicillins Anaphylaxis   Sulfa Antibiotics Other (See Comments)    unknown   Codeine Nausea And Vomiting    HOME MEDICATIONS:  Current Outpatient Medications:    Armodafinil 200 MG TABS, Take 1 tablet (200 mg total) by mouth daily., Disp: 30 tablet, Rfl: 5   clonazePAM (KLONOPIN) 0.5 MG tablet, Take 1 tablet by mouth three times daily as needed, Disp: 60 tablet, Rfl: 5   DULoxetine (CYMBALTA) 60 MG capsule, Take 1 capsule (60 mg total) by mouth daily., Disp: 90 capsule, Rfl: 1   feeding supplement, ENSURE ENLIVE, (ENSURE ENLIVE) LIQD, Take 237 mLs by mouth 2 (two) times daily between meals., Disp: 237 mL, Rfl: 12   megestrol (MEGACE) 40 MG tablet, Take 1 tablet (40 mg total) by mouth 2 (two) times daily., Disp: 60 tablet, Rfl: 11   methocarbamol (ROBAXIN) 500 MG tablet, Take 1 tablet (500 mg total) by mouth every 8 (eight) hours as needed for muscle spasms., Disp: 90 tablet, Rfl: 5    naloxone (NARCAN) 0.4 MG/ML injection, Use as directed, Disp: 1 mL, Rfl: 1   natalizumab (TYSABRI) 300 MG/15ML injection, Inject 15 mLs (300 mg total) into the vein every 30 (thirty) days., Disp: 15 mL, Rfl: 11   ranitidine (ZANTAC) 150 MG tablet, Take 150 mg by mouth 2 (two) times daily., Disp: , Rfl:    VITAMIN D PO, Take 1,000 Units by mouth daily., Disp: , Rfl:    oxyCODONE-acetaminophen (PERCOCET/ROXICET) 5-325 MG tablet, Take 1 tablet by mouth 4 (four) times daily as needed for severe  pain (pain score 7-10). One po qid prn, Disp: 120 tablet, Rfl: 0  PAST MEDICAL HISTORY: Past Medical History:  Diagnosis Date   Chronic pain syndrome    Depression    Fibromyalgia    GAD (generalized anxiety disorder)    Headache    Hypersomnolence    Multiple sclerosis (HCC) 10/28/2014   Obsessive compulsive disorder    Vision abnormalities     PAST SURGICAL HISTORY: Past Surgical History:  Procedure Laterality Date   NO PAST SURGERIES      FAMILY HISTORY: Family History  Problem Relation Age of Onset   Healthy Mother    Diabetes type II Father    Prostate cancer Father     SOCIAL HISTORY:  Social History   Socioeconomic History   Marital status: Married    Spouse name: Not on file   Number of children: Not on file   Years of education: Not on file   Highest education level: Not on file  Occupational History   Not on file  Tobacco Use   Smoking status: Every Day    Current packs/day: 0.50    Types: Cigarettes   Smokeless tobacco: Never  Vaping Use   Vaping status: Never Used  Substance and Sexual Activity   Alcohol use: No    Alcohol/week: 0.0 standard drinks of alcohol   Drug use: No   Sexual activity: Not on file  Other Topics Concern   Not on file  Social History Narrative   Not on file   Social Determinants of Health   Financial Resource Strain: Not on file  Food Insecurity: Not on file  Transportation Needs: Not on file  Physical Activity: Not on file   Stress: Not on file  Social Connections: Not on file  Intimate Partner Violence: Not on file     PHYSICAL EXAM  Vitals:   05/10/23 1417  BP: 120/72  Pulse: 72    There is no height or weight on file to calculate BMI.   General: The patient is well-developed and well-nourished and in no acute distress  Musculoskeletal:   There is tenderness over the piriformis muscles/SI joints, left much more so than right.  .  Neurologic Exam  Mental status:  The patient is alert and oriented x 3 at the time of the examination. The patient has apparent normal recent and remote memory, with good attention span and concentration ability and normal speech today  Cranial nerves: Extraocular movements are full.  Facial strength and sensation was normal.  Trapezius strength was normal.  Hearing was normal and symmetric.  Motor:  Muscle bulk is normal.   Muscle tone is mildly increased in legs. Strength is 5/5.Marland Kitchen   Sensory: She has normal sensation in her arms.  She has reduced vibration sensation in the toes.  Coordination: Cerebellar testing reveals good finger-nose-finger bilaterally.  She has mildly reduced heel-to-shin bilaterally.  Gait and station: Station is normal.  The gait is mildly wide.  Tandem gait is moderately wide.. Romberg is negative.  Reflexes: Deep tendon reflexes are normal in the arms increased bilaterally with nonsustained clonus at the ankles      ASSESSMENT AND PLAN  Multiple sclerosis (HCC) - Plan: Stratify JCV Antibody Test ([facility]), CBC with Differential/Platelet  High risk medication use - Plan: Stratify JCV Antibody Test ([facility]), CBC with Differential/Platelet  Other chronic pain - Plan: [id number]+Oxyco+Alc+Crt-Bund  Cognitive dysfunction  Left sided sciatica   1.  For MS has done well on Tysabri 300  g every 4 weeks and we will continue.  Will check JCV antibody today.   She will continue Tysabri infusions in [place].   Recent MRI of the  brain did not show any progression.   2.  She will continue her medications including clonazepam for anxiety and spasms, oxycodone for pain and armodafinil for fatigue and sleepiness. The PDMP was reviewed and she is compliant with medications and not getting prescriptions from other providers.  She is not showing drug-seeking behavior.   Try to cut the clonazepam just at night but can do bid if needed.  We will check a drug screen today. 3. Stay active and exercise as tolerated. 4.  Advised to follow-up with orthopedics for her spine issues 5.   Check UDS.  PDMP was reviewed and she has been compliant.  6.  Continue Megace to try to help weight gain further.   rtc  4 months, or sooner if new or worsening neurologic symptoms  42-minute office visit with the majority of the time spent face-to-face for history and physical, discussion/counseling and decision-making.  Additional time with record review and documentation.  This visit is part of a comprehensive longitudinal care medical relationship regarding the patients primary diagnosis of multiple sclerosis and related concerns.    Harlowe Dowler A. Epimenio Foot, MD, PhD 05/10/2023, 4:03 PM Certified in Neurology, Clinical Neurophysiology, Sleep Medicine, Pain Medicine and Neuroimaging  [facility] [address] [place], [place] [address] [phone]

## 2023-05-11 LAB — CBC WITH DIFFERENTIAL/PLATELET
Basophils Absolute: 0.1 10*3/uL (ref 0.0–0.2)
Basos: 1 %
EOS (ABSOLUTE): 0.1 10*3/uL (ref 0.0–0.4)
Eos: 1 %
Hematocrit: 39.1 % (ref 34.0–46.6)
Hemoglobin: 14.2 g/dL (ref 11.1–15.9)
Immature Grans (Abs): 0.1 10*3/uL (ref 0.0–0.1)
Immature Granulocytes: 1 %
Lymphocytes Absolute: 3.6 10*3/uL — ABNORMAL HIGH (ref 0.7–3.1)
Lymphs: 42 %
MCH: 34.5 pg — ABNORMAL HIGH (ref 26.6–33.0)
MCHC: 36.3 g/dL — ABNORMAL HIGH (ref 31.5–35.7)
MCV: 95 fL (ref 79–97)
Monocytes Absolute: 0.4 10*3/uL (ref 0.1–0.9)
Monocytes: 5 %
Neutrophils Absolute: 4.3 10*3/uL (ref 1.4–7.0)
Neutrophils: 50 %
Platelets: 253 10*3/uL (ref 150–450)
RBC: 4.11 x10E6/uL (ref 3.77–5.28)
RDW: 12.9 % (ref 11.7–15.4)
WBC: 8.5 10*3/uL (ref 3.4–10.8)

## 2023-05-15 LAB — OPIATES CONFIRMATION, URINE
"   Hydrocodone Confirm": >3000 ng/mL
"  Codeine": NEGATIVE
"  Hydrocodone": POSITIVE — AB
"  Hydromorphone": NEGATIVE
"  Morphine": NEGATIVE
Opiates: POSITIVE ng/mL — AB

## 2023-05-15 LAB — DRUG SCREEN 764883 11+OXYCO+ALC+CRT-BUND
Amphetamines, Urine: NEGATIVE ng/mL
BENZODIAZ UR QL: NEGATIVE ng/mL
Barbiturate: NEGATIVE ng/mL
Cocaine (Metabolite): NEGATIVE ng/mL
Creatinine: 63.5 mg/dL (ref 20.0–300.0)
Ethanol: NEGATIVE %
Meperidine: NEGATIVE ng/mL
Methadone Screen, Urine: NEGATIVE ng/mL
Oxycodone/Oxymorphone, Urine: NEGATIVE ng/mL
Phencyclidine: NEGATIVE ng/mL
Propoxyphene: NEGATIVE ng/mL
Tramadol Ur: NEGATIVE ng/mL
pH, Urine: 6 (ref 4.5–8.9)

## 2023-05-15 LAB — CANNABINOID CONFIRMATION, UR
CANNABINOIDS: POSITIVE — AB
Carboxy THC GC/MS Conf: 466 ng/mL

## 2023-06-06 ENCOUNTER — Other Ambulatory Visit: Payer: Self-pay | Admitting: Neurology

## 2023-06-06 MED ORDER — OXYCODONE-ACETAMINOPHEN 5-325 MG PO TABS: 1.0000 | ORAL_TABLET | Freq: Four times a day (QID) | ORAL | 0 refills | Status: DC | PRN

## 2023-06-06 NOTE — Telephone Encounter (Signed)
Last seen 05/10/23 and next f/u 11/16/23. Last refilled 05/11/23 #120.

## 2023-06-06 NOTE — Telephone Encounter (Signed)
Pt called needing a refill on her oxyCODONE-acetaminophen (PERCOCET/ROXICET) 5-325 MG tablet and is needing it sent to the [facility] on [address]

## 2023-06-11 ENCOUNTER — Other Ambulatory Visit: Payer: Self-pay | Admitting: Neurology

## 2023-06-11 ENCOUNTER — Telehealth: Payer: Self-pay | Admitting: Neurology

## 2023-06-11 MED ORDER — OXYCODONE-ACETAMINOPHEN 5-325 MG PO TABS: 1.0000 | ORAL_TABLET | Freq: Four times a day (QID) | ORAL | 0 refills | Status: DC | PRN

## 2023-06-11 NOTE — Telephone Encounter (Signed)
Patient and patient's husband called.  The pharmacy they usually go to did not have her monthly Percocet.  I tried to call and confirm however could not get through to their preferred pharmacy, they stated that there was a pharmacy in [place] an hour away where they can get their medications and unfortunately had to go there, I did refill the whole prescription to [place] but in the future use the [place] pharmacy as usual.  Thanks

## 2023-06-28 ENCOUNTER — Other Ambulatory Visit: Payer: Self-pay | Admitting: Neurology

## 2023-06-28 NOTE — Telephone Encounter (Signed)
Pt 's husband called stating that the pt is needing this approved for her as soon as it can be. Pt is fine tonight but the refill is needed for the morning dose.

## 2023-06-28 NOTE — Telephone Encounter (Signed)
Last seen 05/10/23 Follow up scheduled on 11/16/23 Last filled on 05/30/23 #30 tablets  Rx pending to be signed

## 2023-06-29 NOTE — Telephone Encounter (Signed)
Spouse calling to report that he spoke with [facility] pharmacy and they are telling him they did not received anything from yesterday afternoon(at 5:36 re: refill for  Armodafinil 200 MG TABS ) please resend

## 2023-07-10 ENCOUNTER — Other Ambulatory Visit: Payer: Self-pay | Admitting: Neurology

## 2023-07-10 MED ORDER — OXYCODONE-ACETAMINOPHEN 5-325 MG PO TABS: 1.0000 | ORAL_TABLET | Freq: Four times a day (QID) | ORAL | 0 refills | Status: DC | PRN

## 2023-07-10 NOTE — Telephone Encounter (Signed)
Pt's mother requesting refill of oxyCODONE-acetaminophen (PERCOCET/ROXICET) 5-325 MG tablet  Send to [facility] [phone]

## 2023-07-10 NOTE — Telephone Encounter (Signed)
Last seen on 05/10/23 Follow up scheduled on 11/16/23 Last filled on 06/11/23 #120 tablets (30 day supply) Rx pending to be signed

## 2023-07-11 ENCOUNTER — Other Ambulatory Visit: Payer: Self-pay | Admitting: Neurology

## 2023-07-11 MED ORDER — OXYCODONE-ACETAMINOPHEN 5-325 MG PO TABS: 1.0000 | ORAL_TABLET | Freq: Four times a day (QID) | ORAL | 0 refills | Status: DC | PRN

## 2023-07-11 NOTE — Telephone Encounter (Signed)
 Pt's husband called wanting to inform provider that the pt's oxyCODONE -acetaminophen  (PERCOCET/ROXICET) 5-325 MG tablet is out of stock at the [facility] in [place] so they are needing it sent to the [facility] in [place] which is about to run out as well. Please advise.

## 2023-07-11 NOTE — Telephone Encounter (Signed)
 Requested Prescriptions   Pending Prescriptions Disp Refills   oxyCODONE -acetaminophen  (PERCOCET/ROXICET) 5-325 MG tablet 120 tablet 0    Sig: Take 1 tablet by mouth 4 (four) times daily as needed for severe pain (pain score 7-10). One po qid prn   Last seen 05/10/23, next appt 11/16/23  Dispenses   Dispensed Days Supply Quantity Provider Pharmacy  OXYCOD/ACETAMIN 5-325MG  TAB 06/11/2023 30 120 each Ines Onetha NOVAK, MD [facility] [phone] ...  OXYCOD/ACETAMIN 5-325MG  TAB 05/11/2023 30 120 each Sater, Charlie LABOR, MD [facility] [phone] ...  OXYCOD/APAP 5-325MG  TAB 04/12/2023 30 120 tablet Vear Charlie LABOR, MD [facility] [phone]...  OXYCOD/APAP 5-325MG  TAB 03/13/2023 30 120 tablet Vear Charlie LABOR, MD [facility] [phone]...  OXYCOD/APAP 5-325MG  TAB 02/11/2023 30 120 tablet Vear Charlie LABOR, MD [facility] [phone]...  OXYCOD/APAP 5-325MG  TAB 01/11/2023 30 120 tablet Vear Charlie LABOR, MD [facility] [phone]...  OXYCOD/APAP 5-325MG  TAB 12/13/2022 30 120 tablet Vear Charlie LABOR, MD [facility] [phone]...  OXYCOD/APAP 5-325MG  TAB 11/13/2022 30 120 tablet Vear Charlie LABOR, MD [facility] [phone]...  OXYCOD/APAP 5-325MG  TAB 10/14/2022 30 120 tablet Vear Charlie LABOR, MD [facility] [phone]...  OXYCOD/APAP 5-325MG  TAB 09/14/2022 30 120 tablet Vear Charlie LABOR, MD [facility] [phone]...  OXYCOD/APAP 5-325MG  TAB 08/16/2022 30 120 tablet Vear Charlie LABOR, MD [facility] [phone]...  OXYCOD/APAP 5-325MG  TAB 07/17/2022 30 120 tablet Buck Saucer, MD [facility] [phone].SABRASABRA

## 2023-07-11 NOTE — Telephone Encounter (Signed)
 [id number]  does not have pt's medication in stock, spouse asking that the oxyCODONE-acetaminophen (PERCOCET/ROXICET) 5-325 MG tablet please be called into [facility] [phone]

## 2023-07-25 ENCOUNTER — Other Ambulatory Visit: Payer: Self-pay | Admitting: Neurology

## 2023-07-25 MED ORDER — CLONAZEPAM 0.5 MG PO TABS: 0.5000 mg | ORAL_TABLET | Freq: Three times a day (TID) | ORAL | 5 refills | Status: DC | PRN

## 2023-07-25 NOTE — Telephone Encounter (Signed)
 Last seen 05/10/23 and next f/u 11/16/23. Last refilled 06/25/23 #60.

## 2023-07-25 NOTE — Telephone Encounter (Signed)
 Pt's husband requesting refill of clonazePAM (KLONOPIN) 0.5 MG tablet  Send to [facility] [phone]

## 2023-07-26 ENCOUNTER — Other Ambulatory Visit: Payer: Self-pay | Admitting: Neurology

## 2023-07-26 MED ORDER — ARMODAFINIL 200 MG PO TABS: 1.0000 | ORAL_TABLET | Freq: Every day | ORAL | 5 refills | Status: DC

## 2023-07-26 NOTE — Telephone Encounter (Signed)
 Pt is requesting a refill for Armodafinil  200 MG TABS.  Pharmacy: [id number]

## 2023-07-26 NOTE — Telephone Encounter (Signed)
 Last seen 05/10/23 and next f/u 11/16/23. Last refilled 06/30/23 #30.

## 2023-07-27 DIAGNOSIS — G35 Multiple sclerosis: Secondary | ICD-10-CM | POA: Diagnosis not present

## 2023-08-01 ENCOUNTER — Other Ambulatory Visit: Payer: Self-pay

## 2023-08-02 ENCOUNTER — Telehealth: Payer: Self-pay | Admitting: Neurology

## 2023-08-02 MED ORDER — DULOXETINE HCL 60 MG PO CPEP: 60.0000 mg | ORAL_CAPSULE | Freq: Every day | ORAL | 2 refills | Status: DC

## 2023-08-02 NOTE — Telephone Encounter (Signed)
Pt's husband  request refill for DULoxetine (CYMBALTA) 60 MG capsule send to [id number]

## 2023-08-02 NOTE — Telephone Encounter (Signed)
E-scribed refill as requested. 

## 2023-08-08 ENCOUNTER — Other Ambulatory Visit: Payer: Self-pay | Admitting: Neurology

## 2023-08-08 ENCOUNTER — Other Ambulatory Visit: Payer: Self-pay

## 2023-08-08 MED ORDER — OXYCODONE-ACETAMINOPHEN 5-325 MG PO TABS: 1.0000 | ORAL_TABLET | Freq: Four times a day (QID) | ORAL | 0 refills | Status: DC | PRN

## 2023-08-08 NOTE — Telephone Encounter (Signed)
Pt is needing a refill on her oxyCODONE-acetaminophen (PERCOCET/ROXICET) 5-325 MG tablet sent in to the [facility] in [place], [place]

## 2023-09-04 ENCOUNTER — Telehealth: Payer: Self-pay | Admitting: *Deleted

## 2023-09-04 NOTE — Telephone Encounter (Signed)
 Called pt at [phone]. Offered 09/06/23 at 8:30am with Dr. Epimenio Foot. She is unable to accept. Scheduled for 09/12/23 at 1:30pm. Asked she check in at 1pm and bring updated insurance cards and med list. She verbalized understanding.

## 2023-09-04 NOTE — Telephone Encounter (Signed)
 Took call from phone room and spoke w/ Carollee Herter (nursing supervisor at [facility]). Wanted to report pt poor compliance with Tysabri infusions. Pt reschedules a lot. Has only had 12 infusions of Tysabri in last 2 yr. Aware I will send to MD to make him aware.   Her direct phone# if any questions: [phone].

## 2023-09-06 ENCOUNTER — Other Ambulatory Visit: Payer: Self-pay | Admitting: Neurology

## 2023-09-06 MED ORDER — OXYCODONE-ACETAMINOPHEN 5-325 MG PO TABS: 1.0000 | ORAL_TABLET | Freq: Four times a day (QID) | ORAL | 0 refills | Status: DC | PRN

## 2023-09-06 NOTE — Telephone Encounter (Signed)
 Pt is requesting a refill for oxyCODONE-acetaminophen (PERCOCET/ROXICET) 5-325 MG tablet.  Pharmacy: [id number]

## 2023-09-06 NOTE — Telephone Encounter (Signed)
 Last seen10/30/24, next appt scheduled3/4/25  Dispenses   Dispensed Days Supply Quantity Provider Pharmacy  OXYCOD/ACETAMIN 5-325MG  TAB 08/10/2023 30 120 each Sater, Pearletha Furl, MD [facility] [phone] ...  OXYCOD/ACETAMIN 5-325MG  TAB 07/11/2023 30 120 each Sater, Pearletha Furl, MD [facility] [phone] ...  OXYCOD/ACETAMIN 5-325MG  TAB 06/11/2023 30 120 each Anson Fret, MD [facility] [phone] ...  OXYCOD/ACETAMIN 5-325MG  TAB 05/11/2023 30 120 each Sater, Pearletha Furl, MD [facility] [phone] ...  OXYCOD/APAP 5-325MG  TAB 04/12/2023 30 120 tablet Asa Lente, MD [facility] [phone]...  OXYCOD/APAP 5-325MG  TAB 03/13/2023 30 120 tablet Asa Lente, MD [facility] [phone]...  OXYCOD/APAP 5-325MG  TAB 02/11/2023 30 120 tablet Asa Lente, MD [facility] [phone]...  OXYCOD/APAP 5-325MG  TAB 01/11/2023 30 120 tablet Asa Lente, MD [facility] [phone]...  OXYCOD/APAP 5-325MG  TAB 12/13/2022 30 120 tablet Asa Lente, MD [facility] [phone]...  OXYCOD/APAP 5-325MG  TAB 11/13/2022 30 120 tablet Asa Lente, MD [facility] [phone]...  OXYCOD/APAP 5-325MG  TAB 10/14/2022 30 120 tablet Asa Lente, MD [facility] [phone]...  OXYCOD/APAP 5-325MG  TAB 09/14/2022 30 120 tablet Asa Lente, MD [facility] [phone].Marland KitchenMarland Kitchen

## 2023-09-12 ENCOUNTER — Ambulatory Visit (INDEPENDENT_AMBULATORY_CARE_PROVIDER_SITE_OTHER): Payer: Medicare Other | Admitting: Neurology

## 2023-09-12 ENCOUNTER — Encounter: Payer: Self-pay | Admitting: Neurology

## 2023-09-12 VITALS — BP 143/88 | HR 73 | Resp 15 | Ht 64.0 in | Wt 85.5 lb

## 2023-09-12 DIAGNOSIS — G35 Multiple sclerosis: Secondary | ICD-10-CM

## 2023-09-12 DIAGNOSIS — R208 Other disturbances of skin sensation: Secondary | ICD-10-CM

## 2023-09-12 DIAGNOSIS — G8929 Other chronic pain: Secondary | ICD-10-CM | POA: Diagnosis not present

## 2023-09-12 DIAGNOSIS — M5432 Sciatica, left side: Secondary | ICD-10-CM

## 2023-09-12 DIAGNOSIS — Z79899 Other long term (current) drug therapy: Secondary | ICD-10-CM

## 2023-09-12 DIAGNOSIS — R636 Underweight: Secondary | ICD-10-CM | POA: Diagnosis not present

## 2023-09-12 DIAGNOSIS — F09 Unspecified mental disorder due to known physiological condition: Secondary | ICD-10-CM | POA: Diagnosis not present

## 2023-09-12 DIAGNOSIS — Z79891 Long term (current) use of opiate analgesic: Secondary | ICD-10-CM | POA: Diagnosis not present

## 2023-09-12 MED ORDER — CLONAZEPAM 0.5 MG PO TABS: 0.5000 mg | ORAL_TABLET | Freq: Two times a day (BID) | ORAL | Status: DC | PRN

## 2023-09-12 NOTE — Progress Notes (Signed)
 u  [facility]  PATIENT: Sabrina [name] DOB: 14-Mar-1981    _________________________________   HISTORICAL  CHIEF COMPLAINT:  Chief Complaint  Patient presents with   Multiple Sclerosis    Rm11, mother present, Discuss other MS treatment options-non compliant w/ Tysabri: pt stated that she lost her insurance but now has it back     HISTORY OF PRESENT ILLNESS:  Sabrina [name] is a 43 y.o. woman with multiple sclerosis and chronic pain.     Update 09/12/2023: She was on Tysabri for RRMS but has had insurance issues.   She tolerates it well.   She had issues with MCD/MCR and she missed 2 doses.    She has no recent exacerbation.  She has been JCV antibody negative.  Her last test was 01/05/23 and was negative (0.12).      She had noravirus and had a lot of vomiting    She feels gait is worse and she gets off balanced easily.   Stumbles but no falls.   She needs to hold the bannister on stairs.  She notes some weakness on her left.   She has some numbness and tingling in her left leg.  She gets sciatica on her left side.     She has urinary urgency and urinary hesitancy.   She uses Depends at night for once a week incontinence.      She notes vision is a little worse and we discussed her seeing an ophthalmologist for eye exam and new glasses.  She has fatigue and somnolence, helped by armodafinil.  And she tolerates it well.   She sleeps well at niht and does not nap.    She continues to experience a lot of pain.   .   She has persistent left buttock and leg pain     Riding in a car is difficult .  She needs to sit on a pillow and often stands up to shift or leans forward.   Injections (piriformis) did not help. Botox into the piriformis muscle only helped a week.   She has seen orthopedics recently for her spinal kyphosis (Pain has been present for many years.  MRI of the lumbar spine in 2016 was normal.  She is on Percocet 5 mg qid with some benefit though she feels it  does not help sometimes she takes it..   She is also on lamotrigine, cyclobenzaprine (takes prn), clonazepam 0.5 once a day and occassionally at night.  Baclofen caused sleepiness.   Gabapentin, Lyrica and Tegretol were not well tolerated.  Cyclobenzaprine The PDMP was reviewed and she is not getting med's from other sources and does not have drug seeking behavior.     She weights less than last time (85 pounds now).   She was 81 last visit with me.     She is off Megace and feels her appetite improved on its own.   She feels she is eating well.        MS History:  She had right optic neuritis in December 2012 and had an MRI of the brain showing optic nerve inflammation and many white matter spots.    She saw Dr. Macon [name] in [place].   She was started on Copaxone.  She likely had an exacerbation in 2013 when she had trouble walking x 2 months.   She received a few days of IV Solu-Medrol.  Last year, she also received a few days of IV Solu-Medrol but she is uncertain  She felt she did well for the first 2 years but switched to Gilenya because she was tired of the shots last year.   At first, she tolerated it well but then her depression got much worse and she had some stomach issues so she returned to Copaxone last month (20 mg daily).   MRI early 2016 showed multiple old MS plaques and 1  Enhancing focus in the right frontal lobe prompting change to Tysabi.   She has been on Tysabri since 2016 and has been JCV Ab negative.  MRI Brain/spine 02/2017:  MRI of the brain and MRI of the thoracic spine performed during her recent hospitalization. The MRI of the brain shows many T2/FLAIR hyperintense foci in the periventricular, juxtacortical and deep white matter. Her to be any change when compared to her previous MRI. The MRI of the thoracic spine shows multiple T2 hyperintense lesions. The T6-T7 lesion is fairly [name] and there could be slight expansion of the spinal cord. However, there is no enhancement.       MRI brain 04/02/2020 was unchanged.    MR brain 03/28/2023 was unchanged       REVIEW OF SYSTEMS: Constitutional: No fevers, chills, sweats, or change in appetite.  Notes a lot of fatigue Eyes: No visual changes, double vision, eye pain Ear, nose and throat: No hearing loss, ear pain, nasal congestion, sore throat Cardiovascular: No chest pain, palpitations Respiratory:  No shortness of breath at rest or with exertion.   No wheezes GastrointestinaI: No nausea, vomiting, diarrhea, abdominal pain, fecal incontinence Genitourinary:  as above. Musculoskeletal:  No neck pain, back pain Integumentary: No rash, pruritus, skin lesions Neurological: as above Psychiatric: Notes some depression and anxiety Endocrine: No palpitations, diaphoresis, change in appetite, change in weigh or increased thirst Hematologic/Lymphatic:  No anemia, purpura, petechiae. Allergic/Immunologic: No itchy/runny eyes, nasal congestion, recent allergic reactions, rashes  ALLERGIES: Allergies  Allergen Reactions   Amoxicillin Anaphylaxis   Penicillins Anaphylaxis   Ciprofloxacin    Methocarbamol     Allergic to cipro    Sulfa Antibiotics Other (See Comments)    unknown   Codeine Nausea And Vomiting    HOME MEDICATIONS:  Current Outpatient Medications:    Armodafinil 200 MG TABS, Take 1 tablet (200 mg total) by mouth daily., Disp: 30 tablet, Rfl: 5   clonazePAM (KLONOPIN) 0.5 MG tablet, Take 1 tablet (0.5 mg total) by mouth 3 (three) times daily as needed., Disp: 60 tablet, Rfl: 5   DULoxetine (CYMBALTA) 60 MG capsule, Take 1 capsule (60 mg total) by mouth daily., Disp: 90 capsule, Rfl: 2   feeding supplement, ENSURE ENLIVE, (ENSURE ENLIVE) LIQD, Take 237 mLs by mouth 2 (two) times daily between meals., Disp: 237 mL, Rfl: 12   megestrol (MEGACE) 40 MG tablet, Take 1 tablet (40 mg total) by mouth 2 (two) times daily., Disp: 60 tablet, Rfl: 11   naloxone (NARCAN) 0.4 MG/ML injection, Use as directed, Disp: 1  mL, Rfl: 1   natalizumab (TYSABRI) 300 MG/15ML injection, Inject 15 mLs (300 mg total) into the vein every 30 (thirty) days., Disp: 15 mL, Rfl: 11   oxyCODONE-acetaminophen (PERCOCET/ROXICET) 5-325 MG tablet, Take 1 tablet by mouth 4 (four) times daily as needed for severe pain (pain score 7-10). One po qid prn, Disp: 120 tablet, Rfl: 0   ranitidine (ZANTAC) 150 MG tablet, Take 150 mg by mouth 2 (two) times daily., Disp: , Rfl:    VITAMIN D PO, Take 1,000 Units by mouth daily., Disp: ,  Rfl:   PAST MEDICAL HISTORY: Past Medical History:  Diagnosis Date   Chronic pain syndrome    Depression    Fibromyalgia    GAD (generalized anxiety disorder)    Headache    Hypersomnolence    Multiple sclerosis (HCC) 10/28/2014   Obsessive compulsive disorder    Vision abnormalities     PAST SURGICAL HISTORY: Past Surgical History:  Procedure Laterality Date   NO PAST SURGERIES      FAMILY HISTORY: Family History  Problem Relation Age of Onset   Healthy Mother    Diabetes type II Father    Prostate cancer Father     SOCIAL HISTORY:  Social History   Socioeconomic History   Marital status: Married    Spouse name: Not on file   Number of children: Not on file   Years of education: Not on file   Highest education level: Not on file  Occupational History   Not on file  Tobacco Use   Smoking status: Every Day    Current packs/day: 0.50    Types: Cigarettes   Smokeless tobacco: Never  Vaping Use   Vaping status: Never Used  Substance and Sexual Activity   Alcohol use: No    Alcohol/week: 0.0 standard drinks of alcohol   Drug use: No   Sexual activity: Not on file  Other Topics Concern   Not on file  Social History Narrative   Not on file   Social Drivers of Health   Financial Resource Strain: Not on file  Food Insecurity: Not on file  Transportation Needs: Not on file  Physical Activity: Not on file  Stress: Not on file  Social Connections: Not on file  Intimate Partner  Violence: Not on file     PHYSICAL EXAM  Vitals:   09/12/23 1333  BP: (!) 143/88  Pulse: 73  Resp: 15  SpO2: 98%  Weight: 85 lb 8 oz (38.8 kg)  Height: 5\' 4"  (1.626 m)    Body mass index is 14.68 kg/m.   General: The patient is well-developed and well-nourished and in no acute distress  Musculoskeletal:   There is tenderness over the piriformis muscles/SI joints, left much more so than right.  .  Neurologic Exam  Mental status:  The patient is alert and oriented x 3 at the time of the examination. The patient has apparent normal recent and remote memory, with good attention span and concentration ability and normal speech today  Cranial nerves: Extraocular movements are full.  Facial strength and sensation was normal.  Trapezius strength was normal.  Hearing was normal and symmetric.  Motor:  Muscle bulk is normal.   Muscle tone is mildly increased in legs. Strength is 5/5.Marland Kitchen   Sensory: She has normal sensation in her arms.  She has reduced vibration sensation in the toes.  Coordination: Cerebellar testing reveals good finger-nose-finger bilaterally.  She has mildly reduced heel-to-shin bilaterally.  Gait and station: Station is normal.  The gait is mildly wide.  Tandem gait is moderately wide.. Romberg is negative.  Reflexes: Deep tendon reflexes are normal in the arms increased bilaterally with nonsustained clonus at the ankles and spread at knees     ASSESSMENT AND PLAN  Multiple sclerosis (HCC)  Cognitive dysfunction  High risk medication use  Other chronic pain  Left sided sciatica  Dysesthesia  Underweight   1.  For MS she has done well on Tysabri 300 g every 4 weeks and we will continue.  Will check  JCV antibody today.   She will continue Tysabri infusions in [place].  Last MRI of the brain did not show any progression.   If she has more trouble getting to the infusion center and if this persists, we will change to one of the oral  agents. 2.  She will continue her medications including clonazepam low dose 0.5 mg bid for anxiety and spasms, oxycodone 5 mg po qid for pain and armodafinil for fatigue and sleepiness. The PDMP was reviewed and she is compliant with medications and not getting prescriptions from other providers.  She is not showing drug-seeking behavior.   We discussed trying to cut clonazepam to just once  day.  We will check a drug screen today. 3. Stay active and exercise as tolerated. 4.  Advised to follow-up with orthopedics for her spine issues 5.   Check UDS.  PDMP was reviewed and she has been compliant. rtc  4 months, or sooner if new or worsening neurologic symptoms   This visit is part of a comprehensive longitudinal care medical relationship regarding the patients primary diagnosis of multiple sclerosis and related concerns.    Shigeko Manard A. Epimenio Foot, MD, PhD 09/12/2023, 2:04 PM Certified in Neurology, Clinical Neurophysiology, Sleep Medicine, Pain Medicine and Neuroimaging  [facility] [address], [address] [place], [place] [address] [phone]

## 2023-09-13 LAB — CBC WITH DIFFERENTIAL/PLATELET
Basophils Absolute: 0.1 10*3/uL (ref 0.0–0.2)
Basos: 1 %
EOS (ABSOLUTE): 0.1 10*3/uL (ref 0.0–0.4)
Eos: 1 %
Hematocrit: 43.8 % (ref 34.0–46.6)
Hemoglobin: 15.2 g/dL (ref 11.1–15.9)
Immature Grans (Abs): 0 10*3/uL (ref 0.0–0.1)
Immature Granulocytes: 1 %
Lymphocytes Absolute: 2.8 10*3/uL (ref 0.7–3.1)
Lymphs: 44 %
MCH: 34 pg — ABNORMAL HIGH (ref 26.6–33.0)
MCHC: 34.7 g/dL (ref 31.5–35.7)
MCV: 98 fL — ABNORMAL HIGH (ref 79–97)
Monocytes Absolute: 0.5 10*3/uL (ref 0.1–0.9)
Monocytes: 8 %
Neutrophils Absolute: 2.9 10*3/uL (ref 1.4–7.0)
Neutrophils: 45 %
Platelets: 251 10*3/uL (ref 150–450)
RBC: 4.47 x10E6/uL (ref 3.77–5.28)
RDW: 12.1 % (ref 11.7–15.4)
WBC: 6.4 10*3/uL (ref 3.4–10.8)

## 2023-09-16 LAB — OXYCODONE/OXYMORPHONE, CONFIRM
OXYCODONE/OXYMORPH: POSITIVE — AB
OXYCODONE: >3000 ng/mL
OXYCODONE: POSITIVE — AB
OXYMORPHONE (GC/MS): 1379 ng/mL
OXYMORPHONE: POSITIVE — AB

## 2023-09-16 LAB — DRUG SCREEN 764883 11+OXYCO+ALC+CRT-BUND
Amphetamines, Urine: NEGATIVE ng/mL
BENZODIAZ UR QL: NEGATIVE ng/mL
Barbiturate: NEGATIVE ng/mL
Cocaine (Metabolite): NEGATIVE ng/mL
Creatinine: 56.7 mg/dL (ref 20.0–300.0)
Ethanol: NEGATIVE %
Meperidine: NEGATIVE ng/mL
Methadone Screen, Urine: NEGATIVE ng/mL
Phencyclidine: NEGATIVE ng/mL
Propoxyphene: NEGATIVE ng/mL
Tramadol: NEGATIVE ng/mL
pH, Urine: 5.8 (ref 4.5–8.9)

## 2023-09-16 LAB — OPIATES CONFIRMATION, URINE: Opiates: NEGATIVE ng/mL

## 2023-09-16 LAB — CANNABINOID CONFIRMATION, UR
CANNABINOIDS: POSITIVE — AB
Carboxy THC GC/MS Conf: >750 ng/mL

## 2023-09-28 DIAGNOSIS — G35 Multiple sclerosis: Secondary | ICD-10-CM | POA: Diagnosis not present

## 2023-09-28 DIAGNOSIS — Z882 Allergy status to sulfonamides status: Secondary | ICD-10-CM | POA: Diagnosis not present

## 2023-09-28 DIAGNOSIS — Z880 Allergy status to penicillin: Secondary | ICD-10-CM | POA: Diagnosis not present

## 2023-09-28 DIAGNOSIS — Z885 Allergy status to narcotic agent status: Secondary | ICD-10-CM | POA: Diagnosis not present

## 2023-09-28 DIAGNOSIS — Z888 Allergy status to other drugs, medicaments and biological substances status: Secondary | ICD-10-CM | POA: Diagnosis not present

## 2023-10-04 ENCOUNTER — Telehealth: Payer: Self-pay | Admitting: Neurology

## 2023-10-04 NOTE — Telephone Encounter (Signed)
 Last seen on 09/12/23 Follow up scheduled on 01/15/24   I called [facility] pt has a Rx on file already, no refill is needed. Pt can get oxycodone Rx that is on hold on 10/09/23. That has been on hold since 09/09/23.  I called patient and explained she has Rx there already and will have to wait until 10/09/23. Pt verbalized she understood.

## 2023-10-04 NOTE — Telephone Encounter (Signed)
 Pt is needing a refill on her oxyCODONE-acetaminophen (PERCOCET/ROXICET) 5-325 MG tablet and needing it sent to the [facility] on [address].

## 2023-10-23 ENCOUNTER — Telehealth: Payer: Self-pay

## 2023-10-23 NOTE — Telephone Encounter (Signed)
 I called patient. She reports that she thinks she is scheduled for a Tysabri infusion this week but is unsure. Also unsure when her last infusion was.  I called Cathleen Coach, nursing supervisor. No answer, left a message asking her to call us  back with last Tysabri infusion date and next Tysabri infusion date. If Cathleen Coach calls back, please obtain these dates from her.

## 2023-10-26 DIAGNOSIS — G35 Multiple sclerosis: Secondary | ICD-10-CM | POA: Diagnosis not present

## 2023-11-06 ENCOUNTER — Other Ambulatory Visit: Payer: Self-pay | Admitting: Neurology

## 2023-11-06 MED ORDER — OXYCODONE-ACETAMINOPHEN 5-325 MG PO TABS: 1.0000 | ORAL_TABLET | Freq: Four times a day (QID) | ORAL | 0 refills | Status: DC | PRN

## 2023-11-06 NOTE — Telephone Encounter (Signed)
 Last seen 09/12/23 and next f/u 01/15/24. Last refilled 10/09/23 #120.

## 2023-11-06 NOTE — Telephone Encounter (Signed)
 Pt is requesting a refill for oxyCODONE-acetaminophen (PERCOCET/ROXICET) 5-325 MG tablet.  Pharmacy: [id number]

## 2023-11-16 ENCOUNTER — Ambulatory Visit: Payer: Medicare Other | Admitting: Neurology

## 2023-11-22 ENCOUNTER — Telehealth: Payer: Self-pay | Admitting: Neurology

## 2023-11-22 ENCOUNTER — Encounter: Payer: Self-pay | Admitting: Family Medicine

## 2023-11-22 DIAGNOSIS — G35 Multiple sclerosis: Secondary | ICD-10-CM

## 2023-11-22 DIAGNOSIS — Z79899 Other long term (current) drug therapy: Secondary | ICD-10-CM

## 2023-11-22 NOTE — Telephone Encounter (Signed)
 Isa Manuel from [facility] called stating that pt need orders send for medication  natalizumab  (TYSABRI ) 300 MG/15ML injection  order has expired .  Also Isa Manuel stated  pt need authorization for touch program to be able to continue to get  treatment . Authorization expire in December.

## 2023-11-22 NOTE — Telephone Encounter (Signed)
 Dr. Sater- this is the pt we discussed the other day. You were wanting to review to see if you wanted to have her continue on Tysabri  d/t non-compliance or change her therapy

## 2023-11-22 NOTE — Telephone Encounter (Signed)
 error

## 2023-11-28 NOTE — Telephone Encounter (Signed)
 Faxed completed/signed [employer] pt auth form to [phone]. Received fax confirmation.  Faxed order below to [facility]. Received fax confirmation.

## 2023-11-28 NOTE — Telephone Encounter (Signed)
 Touch Siegfried Dress via [employer] pending MD signature   Updated Tysabri  orders pending MD signature

## 2023-12-05 ENCOUNTER — Other Ambulatory Visit: Payer: Self-pay | Admitting: Neurology

## 2023-12-05 MED ORDER — OXYCODONE-ACETAMINOPHEN 5-325 MG PO TABS: 1.0000 | ORAL_TABLET | Freq: Four times a day (QID) | ORAL | 0 refills | Status: DC | PRN

## 2023-12-05 NOTE — Telephone Encounter (Signed)
 Pt husband called in regards to pt medication need to be refilled.  oxyCODONE -acetaminophen  (PERCOCET/ROXICET) 5-325 MG tablet  Pt would like medication to be sent to   [facility] - [place], [place] - [address] (Ph: [phone])

## 2023-12-05 NOTE — Telephone Encounter (Signed)
 Last seen 09/12/23 and next f/u 01/15/24. Last refilled 11/08/23 #120.

## 2023-12-08 DIAGNOSIS — G35 Multiple sclerosis: Secondary | ICD-10-CM | POA: Diagnosis not present

## 2023-12-27 ENCOUNTER — Telehealth: Payer: Self-pay

## 2023-12-27 ENCOUNTER — Other Ambulatory Visit (HOSPITAL_COMMUNITY): Payer: Self-pay

## 2023-12-27 NOTE — Telephone Encounter (Signed)
 Pharmacy Patient Advocate Encounter   Received notification from CoverMyMeds that prior authorization for Armodafinil  200MG  tablets is required/requested.   Insurance verification completed.   The patient is insured through [facility] .   Per test claim: PA required; PA submitted to above mentioned insurance via CoverMyMeds Key/confirmation #/EOC [id number] Status is pending

## 2023-12-28 NOTE — Telephone Encounter (Signed)
 I see previous documentation of this below. Can you appeal?

## 2023-12-28 NOTE — Telephone Encounter (Signed)
 Pharmacy Patient Advocate Encounter  Received notification from [employer] that Prior Authorization for Armodafinil  has been DENIED.  Full denial letter will be uploaded to the media tab. See denial reason below.   PA #/Case ID/Reference #: [id number]/A

## 2023-12-29 ENCOUNTER — Telehealth: Payer: Self-pay | Admitting: Pharmacist

## 2023-12-29 NOTE — Telephone Encounter (Signed)
 An E-Appeal has been submitted for armodafinil . Will advise when response is receive, please be advised that most companies may take 30 days to make a decision. Appeal letter and supporting information were uploaded and submitted via [employer] website on 12/29/2023.  Thank you, Dene Fines, PharmD Clinical Pharmacist  [place]  Direct Dial: [phone]

## 2023-12-29 NOTE — Telephone Encounter (Signed)
 Will route to pharmacist for an appeals review.

## 2024-01-01 NOTE — Telephone Encounter (Signed)
 Called pt at [phone]. Relayed armodafinil  denied. She will continue using goodrx coupon to fill rx.

## 2024-01-01 NOTE — Telephone Encounter (Signed)
 The appeal for armodafinil  was denied:

## 2024-01-04 ENCOUNTER — Other Ambulatory Visit: Payer: Self-pay | Admitting: Neurology

## 2024-01-04 DIAGNOSIS — G35 Multiple sclerosis: Secondary | ICD-10-CM | POA: Diagnosis not present

## 2024-01-04 MED ORDER — OXYCODONE-ACETAMINOPHEN 5-325 MG PO TABS: 1.0000 | ORAL_TABLET | Freq: Four times a day (QID) | ORAL | 0 refills | Status: DC | PRN

## 2024-01-04 NOTE — Telephone Encounter (Signed)
 Pt called  to request medication refill  oxyCODONE -acetaminophen  (PERCOCET/ROXICET) 5-325 MG tablet    Pt would like medication to be sent to     [facility] - [place], [place] - [address] (Ph: [phone])

## 2024-01-04 NOTE — Telephone Encounter (Signed)
 Last seen 09/12/23 and next f/u 01/15/24. Last refilled 12/08/23 #120.

## 2024-01-10 NOTE — Telephone Encounter (Signed)
 Called [facility] at [phone] to see if JCV ab has been drawn recently. LVM for them to call back with update.

## 2024-01-15 ENCOUNTER — Telehealth: Payer: Self-pay | Admitting: Neurology

## 2024-01-15 ENCOUNTER — Ambulatory Visit: Admitting: Family Medicine

## 2024-01-15 NOTE — Patient Instructions (Incomplete)

## 2024-01-15 NOTE — Telephone Encounter (Signed)
 Request to reschedule appointment due to lack of transportation

## 2024-01-15 NOTE — Progress Notes (Deleted)
 No chief complaint on file.    HISTORY OF PRESENT ILLNESS:  01/15/24 ALL:  Sabrina [name] is a 43 y.o. female here today for follow up for RRMS. She continues Tysabri  infusions. Labs have been stable. Last JCV 0.20, assay negative. MRI in 03/2020 showed stable MS and progressing cerebral atrophy. Repeat imaging ordered at last visit with Dr Vear but she didn't hear back to schedule.   She feels that MS symptoms are about the same. She denies new or exacerbating symptoms. No falls. Gait is stable. She uses Rolator for longer distances (greater than 10 minutes). She continues to have significant low back, left leg and buttock pain. Oxycodone  5/325mg  QID helps a little. She is no longer taking cyclobenzaprine . It made her too groggy. She was scared to take tizanidine  but can't remember why.   She was seen in consult with ortho at [facility]. She was referred to spine specialist/surgeon and to PT. She has follow up with them next month.   Mood is stable. She continues duloxetine  60mg  daily and clonazepam  0.5mg  up to TID (usually 1-2 times a day) helps with mood, sleep, and pain. Memory is stable. No significant changes. She is sleeping well. She sleeps for about 7-8 hours a night. Appetite is ok. She was restarted on Megace . She was 83lbs 06/2022, now 85lbs. She continues to drink [employer] (sometimes adds ice cream) 1-2 times daily.   She feels fatigue is about the same. She continues armodafinil  200mg  daily. She feels that she would not be able to function without it. She tries to stay active around the home. She does have have an exercise regimen.   She feels bladder and bowel habits are stable, no significant changes. She did not wish to take oxybutynin .   She has had more blurred vision, bilaterally. She plans to schedule eye exam soon. She continues to read without significant difficulty.    HISTORY (copied from Dr Duncan previous note)  Sabrina [name] is a 43 y.o.  woman with multiple sclerosis and chronic pain.      Update 09/12/2023: She was on Tysabri  for RRMS but has had insurance issues.   She tolerates it well.   She had issues with MCD/MCR and she missed 2 doses.    She has no recent exacerbation.  She has been JCV antibody negative.  Her last test was 01/05/23 and was negative (0.12).       She had noravirus and had a lot of vomiting     She feels gait is worse and she gets off balanced easily.   Stumbles but no falls.   She needs to hold the bannister on stairs.  She notes some weakness on her left.   She has some numbness and tingling in her left leg.  She gets sciatica on her left side.      She has urinary urgency and urinary hesitancy.   She uses Depends at night for once a week incontinence.       She notes vision is a little worse and we discussed her seeing an ophthalmologist for eye exam and new glasses.   She has fatigue and somnolence, helped by armodafinil .  And she tolerates it well.   She sleeps well at niht and does not nap.     She continues to experience a lot of pain.   .   She has persistent left buttock and leg pain     Riding in a car is difficult .  She needs to sit on a pillow and often stands up to shift or leans forward.   Injections (piriformis) did not help. Botox into the piriformis muscle only helped a week.   She has seen orthopedics recently for her spinal kyphosis (Pain has been present for many years.  MRI of the lumbar spine in 2016 was normal.   She is on Percocet 5 mg qid with some benefit though she feels it does not help sometimes she takes it..   She is also on lamotrigine , cyclobenzaprine  (takes prn), clonazepam  0.5 once a day and occassionally at night.  Baclofen  caused sleepiness.   Gabapentin, Lyrica and Tegretol were not well tolerated.  Cyclobenzaprine  The PDMP was reviewed and she is not getting med's from other sources and does not have drug seeking behavior.      She weights less than last time (85 pounds  now).   She was 83 last visit with me.     She is off Megace  and feels her appetite improved on its own.   She feels she is eating well.       MS History:  She had right optic neuritis in December 2012 and had an MRI of the brain showing optic nerve inflammation and many white matter spots.    She saw Dr. Herchel in [place].   She was started on Copaxone.  She likely had an exacerbation in 2013 when she had trouble walking x 2 months.   She received a few days of IV Solu-Medrol .  Last year, she also received a few days of IV Solu-Medrol  but she is uncertain   She felt she did well for the first 2 years but switched to Gilenya because she was tired of the shots last year.   At first, she tolerated it well but then her depression got much worse and she had some stomach issues so she returned to Copaxone last month (20 mg daily).   MRI early 2016 showed multiple old MS plaques and 1  Enhancing focus in the right frontal lobe prompting change to Tysabi.   She has been on Tysabri  since 2016 and has been JCV Ab negative.   MRI Brain/spine 02/2017:  MRI of the brain and MRI of the thoracic spine performed during her recent hospitalization. The MRI of the brain shows many T2/FLAIR hyperintense foci in the periventricular, juxtacortical and deep white matter. Her to be any change when compared to her previous MRI. The MRI of the thoracic spine shows multiple T2 hyperintense lesions. The T6-T7 lesion is fairly large and there could be slight expansion of the spinal cord. However, there is no enhancement.       MRI brain 04/02/2020 was unchanged.     MR brain 03/28/2023 was unchanged     REVIEW OF SYSTEMS: Out of a complete 14 system review of symptoms, the patient complains only of the following symptoms, chronic pain, decreased appetite, discoloration of posterior legs, blurred vision and all other reviewed systems are negative.    ALLERGIES: Allergies  Allergen Reactions   Amoxicillin Anaphylaxis    Penicillins Anaphylaxis   Ciprofloxacin    Methocarbamol      Allergic to cipro    Sulfa Antibiotics Other (See Comments)    unknown   Codeine Nausea And Vomiting     HOME MEDICATIONS: Outpatient Medications Prior to Visit  Medication Sig Dispense Refill   Armodafinil  200 MG TABS Take 1 tablet (200 mg total) by mouth daily. 30 tablet 5  clonazePAM  (KLONOPIN ) 0.5 MG tablet Take 1 tablet (0.5 mg total) by mouth 2 (two) times daily as needed.     DULoxetine  (CYMBALTA ) 60 MG capsule Take 1 capsule (60 mg total) by mouth daily. 90 capsule 2   feeding supplement, ENSURE ENLIVE, (ENSURE ENLIVE) LIQD Take 237 mLs by mouth 2 (two) times daily between meals. 237 mL 12   megestrol  (MEGACE ) 40 MG tablet Take 1 tablet (40 mg total) by mouth 2 (two) times daily. 60 tablet 11   naloxone  (NARCAN ) 0.4 MG/ML injection Use as directed 1 mL 1   natalizumab  (TYSABRI ) 300 MG/15ML injection Inject 15 mLs (300 mg total) into the vein every 30 (thirty) days. 15 mL 11   oxyCODONE -acetaminophen  (PERCOCET/ROXICET) 5-325 MG tablet Take 1 tablet by mouth 4 (four) times daily as needed for severe pain (pain score 7-10). One po qid prn 120 tablet 0   ranitidine (ZANTAC) 150 MG tablet Take 150 mg by mouth 2 (two) times daily.     VITAMIN D  PO Take 1,000 Units by mouth daily.     No facility-administered medications prior to visit.     PAST MEDICAL HISTORY: Past Medical History:  Diagnosis Date   Chronic pain syndrome    Depression    Fibromyalgia    GAD (generalized anxiety disorder)    Headache    Hypersomnolence    Multiple sclerosis (HCC) 10/28/2014   Obsessive compulsive disorder    Vision abnormalities      PAST SURGICAL HISTORY: Past Surgical History:  Procedure Laterality Date   NO PAST SURGERIES       FAMILY HISTORY: Family History  Problem Relation Age of Onset   Healthy Mother    Diabetes type II Father    Prostate cancer Father      SOCIAL HISTORY: Social History    Socioeconomic History   Marital status: Married    Spouse name: Not on file   Number of children: Not on file   Years of education: Not on file   Highest education level: Not on file  Occupational History   Not on file  Tobacco Use   Smoking status: Every Day    Current packs/day: 0.50    Types: Cigarettes   Smokeless tobacco: Never  Vaping Use   Vaping status: Never Used  Substance and Sexual Activity   Alcohol use: No    Alcohol/week: 0.0 standard drinks of alcohol   Drug use: No   Sexual activity: Not on file  Other Topics Concern   Not on file  Social History Narrative   Not on file   Social Drivers of Health   Financial Resource Strain: Not on file  Food Insecurity: Not on file  Transportation Needs: Not on file  Physical Activity: Not on file  Stress: Not on file  Social Connections: Not on file  Intimate Partner Violence: Not on file      PHYSICAL EXAM  There were no vitals filed for this visit.    There is no height or weight on file to calculate BMI.   Generalized: thin, in no acute distress  Cardiology: normal rate and rhythm, no murmur auscultated  Respiratory: clear to auscultation bilaterally    Neurological examination  Mentation: Alert oriented to time, place, history taking. Follows all commands speech and language fluent Cranial nerve II-XII: Pupils were equal round reactive to light. Extraocular movements were full, visual field were full on confrontational test. Facial sensation and strength were normal. Head turning and shoulder shrug  were normal and symmetric. Motor: The motor testing reveals 5 over 5 strength of all 4 extremities with exception of 4/5 left hip flexion  Sensory: Sensory testing is intact to soft touch on all 4 extremities. No evidence of extinction is noted.  Coordination: Cerebellar testing reveals good finger-nose-finger and heel-to-shin bilaterally.  Gait and station: Kyphosis noted, unable to tolerate sitting  upright due to back pain, Gait is wide but stable without assistive device. Tandem gait is mildly wide and unsteady  Reflexes: Deep tendon reflexes brisk bilaterally     DIAGNOSTIC DATA (LABS, IMAGING, TESTING) - I reviewed patient records, labs, notes, testing and imaging myself where available.  Lab Results  Component Value Date   WBC 6.4 09/12/2023   HGB 15.2 09/12/2023   HCT 43.8 09/12/2023   MCV 98 (H) 09/12/2023   PLT 251 09/12/2023      Component Value Date/Time   NA 139 06/21/2022 1412   K 4.3 06/21/2022 1412   CL 101 06/21/2022 1412   CO2 26 06/21/2022 1412   GLUCOSE 88 06/21/2022 1412   GLUCOSE 138 (H) 02/23/2017 0452   BUN 7 06/21/2022 1412   CREATININE 0.67 06/21/2022 1412   CALCIUM 9.2 06/21/2022 1412   PROT 6.7 06/21/2022 1412   ALBUMIN 4.6 06/21/2022 1412   AST 13 06/21/2022 1412   ALT 8 06/21/2022 1412   ALKPHOS 96 06/21/2022 1412   BILITOT 0.3 06/21/2022 1412   GFRNONAA 110 08/20/2020 1345   GFRAA 126 08/20/2020 1345   No results found for: CHOL, HDL, LDLCALC, LDLDIRECT, TRIG, CHOLHDL No results found for: YHAJ8R No results found for: VITAMINB12 Lab Results  Component Value Date   TSH 1.239 02/21/2017        No data to display               No data to display           ASSESSMENT AND PLAN  43 y.o. year old female  has a past medical history of Chronic pain syndrome, Depression, Fibromyalgia, GAD (generalized anxiety disorder), Headache, Hypersomnolence, Multiple sclerosis (HCC) (10/28/2014), Obsessive compulsive disorder, and Vision abnormalities. here with   Relapsing remitting multiple sclerosis (HCC)  High risk medication use  Cognitive dysfunction  Other chronic pain  Dysesthesia  Left sided sciatica  Chronic prescription opiate use  Muscle spasticity  Gait disorder  Sabrina [name] feels that MS symptoms are fairly stable but continues to have significant low back pain with radicular symptoms. She will  continue Tysabri  infusions every 4 weeks. We will update labs today. MRI stable in 03/2020. She will call to schedule imaging. Continue oxycodone  QID. PDMP reviewed and shows appropriate refills. Last filled 12/13/2022. She will continue clonazepam , duloxetine , and modafinil  as prescribed. She was encouraged to continue healthy lifestyle habits. Fall precautions advised. She will follow up with Dr Vear in 4-6 months.   No orders of the defined types were placed in this encounter.    No orders of the defined types were placed in this encounter.   Greig Forbes, MSN, FNP-C 01/15/2024, 8:00 AM  [facility] [address] [place], [place] [address] [phone]

## 2024-01-23 ENCOUNTER — Telehealth: Payer: Self-pay | Admitting: Neurology

## 2024-01-23 NOTE — Telephone Encounter (Signed)
 Took call from phone room and spoke w/ [name]/[facility]. She found where pt had lab drawn 11/2023. She will fax result to us  at [phone] to have us  review and see if this is what was needed. If not, I will call her back to let her know to have lab redrawn at infusion next week. Her direct # [phone]

## 2024-01-23 NOTE — Telephone Encounter (Signed)
 Sabrina [name] ([name])Faxed additioinal lab work about JCV. Would like a call back and call my cell.

## 2024-01-23 NOTE — Telephone Encounter (Signed)
 Does not appear this is correct lab completed. Confirming with MD.

## 2024-01-23 NOTE — Telephone Encounter (Signed)
 See other encounter from 11/22/23. This is duplicate.

## 2024-01-23 NOTE — Telephone Encounter (Signed)
 Dr. Vear- do you need them to repeat correct JCV ab w/ index next week at infusion?

## 2024-01-23 NOTE — Telephone Encounter (Signed)
 Called [facility] and spoke with Clotilda who states pt recently got infusion on 01/04/24. JCV ab not drawn. Unclear why and she will speak with nurses. It was on order sent.   Pt scheduled to come for infusion next week. She will ask lab personal to see if they are able to to JCV ab. She could not find in their system. They use [facility] and could not locate under JCV ab or stratify JCV ab w/ index. She will call back and let us  know.

## 2024-01-23 NOTE — Telephone Encounter (Signed)
 Clotilda, RN w/ [phone] Health has called for Maurilio, RN

## 2024-01-23 NOTE — Telephone Encounter (Signed)
 Called and LVM for [facility]. Fax we received did not have any results for JCV. Asked for further info re result.

## 2024-01-24 ENCOUNTER — Other Ambulatory Visit: Payer: Self-pay | Admitting: Neurology

## 2024-01-24 NOTE — Telephone Encounter (Signed)
 Took call from phone room and spoke w/ Clotilda. They did not receive lab order. Asked we re-fax to their department at [phone]. Re-faxed, received fax confirmation. They will make sure to follow up on results within 1-2 wk after lab draw and fax us  results once back.

## 2024-01-24 NOTE — Telephone Encounter (Signed)
 Faxed lab order for correct JCV ab to be drawn at upcoming infusion at [phone]. Received fax confirmation.

## 2024-01-24 NOTE — Telephone Encounter (Signed)
 Called [name]/[facility] at [phone]. LVM for her to call office.

## 2024-01-24 NOTE — Addendum Note (Signed)
 Addended by: JOSHUA MAURILIO CROME on: 01/24/2024 07:48 AM   Modules accepted: Orders

## 2024-01-25 NOTE — Telephone Encounter (Signed)
 Last seen 09/12/23 and next f/u 04/03/24. Last refilled armodafinil  12/27/23 #30 and clonazepam  12/25/23 #60.

## 2024-01-26 ENCOUNTER — Telehealth: Payer: Self-pay | Admitting: Neurology

## 2024-01-26 NOTE — Telephone Encounter (Signed)
 Pt husband called to request Medication refill  Armodafinil  200 MG TABS    Husband would like medication to be sent to :  [facility] - [place], [place] - [address] (Ph: [phone])

## 2024-01-26 NOTE — Telephone Encounter (Signed)
 This was sent to the pharmacy last night. Pt should contact pharmacy. Confirmed it went to [facility] pharmacy as requested E-Prescribing Status: Receipt confirmed by pharmacy (01/25/2024  5:44 PM EDT)

## 2024-02-01 ENCOUNTER — Telehealth: Payer: Self-pay | Admitting: Neurology

## 2024-02-01 DIAGNOSIS — F32A Depression, unspecified: Secondary | ICD-10-CM

## 2024-02-01 DIAGNOSIS — G35 Multiple sclerosis: Secondary | ICD-10-CM

## 2024-02-01 NOTE — Telephone Encounter (Signed)
 [facility] [place]) Notifying patient did not show up for infusion and labwork. Call spoke withpatient's husband, he said did not have time to talk. Would call back tomorrow. Wanted to make your aware of patient's compliance. Would like a call back.

## 2024-02-01 NOTE — Telephone Encounter (Signed)
 Called Sabrina [name]. Pt no showed Tysabri  infusion today. Husband told her he would call tomorrow with an update. I asked if husband calls them tomorrow with update and/or r/s to please call and let us  know. Relayed we are open 8-12pm tomorrow and then starting again on Monday at 8am. She verbalized understanding.

## 2024-02-05 ENCOUNTER — Other Ambulatory Visit: Payer: Self-pay | Admitting: Neurology

## 2024-02-05 MED ORDER — OXYCODONE-ACETAMINOPHEN 5-325 MG PO TABS: 1.0000 | ORAL_TABLET | Freq: Four times a day (QID) | ORAL | 0 refills | Status: DC | PRN

## 2024-02-05 NOTE — Telephone Encounter (Signed)
 Last seen 09/12/23 and next f/u 04/03/24. Last refilled 01/07/24 #120.

## 2024-02-05 NOTE — Telephone Encounter (Signed)
 Pt is requesting a refill for OxyCODONE -acetaminophen  (PERCOCET/ROXICET) 5-325 MG tablet.  Pharmacy: [facility] to

## 2024-02-08 DIAGNOSIS — G35 Multiple sclerosis: Secondary | ICD-10-CM | POA: Diagnosis not present

## 2024-02-08 NOTE — Telephone Encounter (Signed)
 Called [name] at [phone]. LVM for her to call back.   Trying to get update on whether pt got r/s for her Tysabri  infusion that she missed last week.

## 2024-02-12 MED ORDER — MEGESTROL ACETATE 40 MG PO TABS: 40.0000 mg | ORAL_TABLET | Freq: Two times a day (BID) | ORAL | 7 refills | Status: DC

## 2024-02-12 NOTE — Telephone Encounter (Signed)
 Called [name] back. Confirmed pt had Tysabri  infusion 02/08/24 and they drew JCV lab. Results pending. She will keep eye out for results and fax once back.   She wanted to report that nurses observed at recent infusion that pt looking very thin. Has lost 4 lb in last 5 wk. Current weight: 85lb. She also could not ambulate out of infusion suite once done. Sister was with her. Aware I will let Dr. Vear know update.

## 2024-02-12 NOTE — Telephone Encounter (Signed)
 Clotilda has called asking to speak with Maurilio, RN

## 2024-02-12 NOTE — Telephone Encounter (Addendum)
 I called pt at [phone].  Spoke w/ mother. She went with pt last week to infusion. Has noticed a lot of concerns  In last few weeks. Decreased appetite, not eating well. Losing weight. Hard to keep head held up. Dozes off in chair. Experiencing double vision recently. Spends a lot of time on computer. Experiencing increased depression/stress. Back doctor left practice, they are going to try and get her established with new one.   I spoke w/ Dr. Vear. He recommends she get established with psychiatry/PCP. Also try to decrease oxycodone  dose from 1 po QID to TID. I relayed to mother. Referral placed for psychiatry. She will call to make appt for PCP and eye doctor.   Rx for Megace  called into:  [facility] - [place], [place] - [address]

## 2024-02-12 NOTE — Addendum Note (Signed)
 Addended by: JOSHUA MAURILIO CROME on: 02/12/2024 05:26 PM   Modules accepted: Orders

## 2024-02-15 ENCOUNTER — Telehealth: Payer: Self-pay | Admitting: *Deleted

## 2024-02-15 ENCOUNTER — Telehealth: Payer: Self-pay | Admitting: Neurology

## 2024-02-15 NOTE — Telephone Encounter (Signed)
 SABRA

## 2024-02-15 NOTE — Telephone Encounter (Signed)
 Referral for Psychiatry faxed to [facility]  [facility] Phone [phone] Fax [phone]

## 2024-03-03 ENCOUNTER — Other Ambulatory Visit: Payer: Self-pay | Admitting: Neurology

## 2024-03-04 ENCOUNTER — Encounter: Payer: Self-pay | Admitting: Neurology

## 2024-03-05 ENCOUNTER — Other Ambulatory Visit: Payer: Self-pay | Admitting: Neurology

## 2024-03-05 ENCOUNTER — Other Ambulatory Visit: Payer: Self-pay

## 2024-03-05 MED ORDER — MEGESTROL ACETATE 40 MG PO TABS: 40.0000 mg | ORAL_TABLET | Freq: Two times a day (BID) | ORAL | 7 refills | Status: AC

## 2024-03-05 MED ORDER — OXYCODONE-ACETAMINOPHEN 5-325 MG PO TABS: 1.0000 | ORAL_TABLET | Freq: Four times a day (QID) | ORAL | 0 refills | Status: DC | PRN

## 2024-03-05 NOTE — Telephone Encounter (Addendum)
 I called [facility] and was told solution and pills are on back order only at [facility] as far as they know. Patient said she takes the tablets  I called patient to see if she wanted me to send to another pharmacy. Pt asked I send to [facility]   Rx sent.

## 2024-03-05 NOTE — Telephone Encounter (Signed)
 Pt Husband called to request medication refill  oxyCODONE -acetaminophen  (PERCOCET/ROXICET) 5-325 MG tablet     Pt husband  would like medication to be sent to    [facility] - [place], [place] - [address] (Ph: [phone])

## 2024-03-05 NOTE — Telephone Encounter (Signed)
 Last Seen 09/12/2023 Upcoming Appointment 04/03/2024  Oxycodone  Last filled 02/06/2024

## 2024-03-07 ENCOUNTER — Telehealth: Payer: Self-pay

## 2024-03-07 ENCOUNTER — Other Ambulatory Visit (HOSPITAL_COMMUNITY): Payer: Self-pay

## 2024-03-07 NOTE — Telephone Encounter (Signed)
 Phone room: please call spouse back and provide update below, thank you

## 2024-03-07 NOTE — Telephone Encounter (Signed)
 Pt spouse has called to report that a PA is needed for pt's oxyCODONE -acetaminophen  (PERCOCET/ROXICET) 5-325 MG tablet

## 2024-03-07 NOTE — Telephone Encounter (Signed)
 I received a request asking me to do PA for oxycodone , However the plan states PT has access to the med. I called [facility] in [place] to verify and they stated the PT just picked up today and paid out of pocket cash for the medication.

## 2024-03-07 NOTE — Telephone Encounter (Signed)
 Pt's mother(on DPR) called, the message was relayed to her, she will call pt's insurance company.

## 2024-03-14 DIAGNOSIS — G35 Multiple sclerosis: Secondary | ICD-10-CM | POA: Diagnosis not present

## 2024-03-25 DIAGNOSIS — F17210 Nicotine dependence, cigarettes, uncomplicated: Secondary | ICD-10-CM | POA: Diagnosis not present

## 2024-03-25 DIAGNOSIS — E538 Deficiency of other specified B group vitamins: Secondary | ICD-10-CM | POA: Diagnosis not present

## 2024-03-25 DIAGNOSIS — Z1231 Encounter for screening mammogram for malignant neoplasm of breast: Secondary | ICD-10-CM | POA: Diagnosis not present

## 2024-03-25 DIAGNOSIS — Z13220 Encounter for screening for lipoid disorders: Secondary | ICD-10-CM | POA: Diagnosis not present

## 2024-03-25 DIAGNOSIS — Z716 Tobacco abuse counseling: Secondary | ICD-10-CM | POA: Diagnosis not present

## 2024-03-25 DIAGNOSIS — A6920 Lyme disease, unspecified: Secondary | ICD-10-CM | POA: Diagnosis not present

## 2024-03-25 DIAGNOSIS — G8929 Other chronic pain: Secondary | ICD-10-CM | POA: Diagnosis not present

## 2024-03-25 DIAGNOSIS — M5442 Lumbago with sciatica, left side: Secondary | ICD-10-CM | POA: Diagnosis not present

## 2024-03-25 DIAGNOSIS — I10 Essential (primary) hypertension: Secondary | ICD-10-CM | POA: Diagnosis not present

## 2024-04-02 NOTE — Progress Notes (Unsigned)
 No chief complaint on file.    HISTORY OF PRESENT ILLNESS:  04/02/24 ALL:  Sabrina [name] is a 43 y.o. female here today for follow up for RRMS. She continues Tysabri  infusions. Labs have been stable. Last JCV 0.20, assay negative. MRI in 03/2020 showed stable MS and progressing cerebral atrophy. Repeat imaging ordered at last visit with Dr Vear but she didn't hear back to schedule.   She feels that MS symptoms are about the same. She denies new or exacerbating symptoms. No falls. Gait is stable. She uses Rolator for longer distances (greater than 10 minutes). She continues to have significant low back, left leg and buttock pain. Oxycodone  5/325mg  QID helps a little. She is no longer taking cyclobenzaprine . It made her too groggy. She was scared to take tizanidine  but can't remember why.   She was seen in consult with ortho at [facility]. She was referred to spine specialist/surgeon and to PT. She has follow up with them next month.   Mood is stable. She continues duloxetine  60mg  daily and clonazepam  0.5mg  up to TID (usually 1-2 times a day) helps with mood, sleep, and pain. Memory is stable. No significant changes. She is sleeping well. She sleeps for about 7-8 hours a night. Appetite is ok. She was restarted on Megace . She was 83lbs 06/2022, now 85lbs. She continues to drink [employer] (sometimes adds ice cream) 1-2 times daily.   She feels fatigue is about the same. She continues armodafinil  200mg  daily. She feels that she would not be able to function without it. She tries to stay active around the home. She does have have an exercise regimen.   She feels bladder and bowel habits are stable, no significant changes. She did not wish to take oxybutynin .   She has had more blurred vision, bilaterally. She plans to schedule eye exam soon. She continues to read without significant difficulty.   HISTORY (copied from Dr Duncan previous note)  Sabrina [name] is a 43 y.o. woman  with multiple sclerosis and chronic pain.      Update 09/12/2023: She was on Tysabri  for RRMS but has had insurance issues.   She tolerates it well.   She had issues with MCD/MCR and she missed 2 doses.    She has no recent exacerbation.  She has been JCV antibody negative.  Her last test was 01/05/23 and was negative (0.12).       She had noravirus and had a lot of vomiting     She feels gait is worse and she gets off balanced easily.   Stumbles but no falls.   She needs to hold the bannister on stairs.  She notes some weakness on her left.   She has some numbness and tingling in her left leg.  She gets sciatica on her left side.      She has urinary urgency and urinary hesitancy.   She uses Depends at night for once a week incontinence.       She notes vision is a little worse and we discussed her seeing an ophthalmologist for eye exam and new glasses.   She has fatigue and somnolence, helped by armodafinil .  And she tolerates it well.   She sleeps well at niht and does not nap.     She continues to experience a lot of pain.   .   She has persistent left buttock and leg pain     Riding in a car is difficult .  She needs to sit on a pillow and often stands up to shift or leans forward.   Injections (piriformis) did not help. Botox into the piriformis muscle only helped a week.   She has seen orthopedics recently for her spinal kyphosis (Pain has been present for many years.  MRI of the lumbar spine in 2016 was normal.   She is on Percocet 5 mg qid with some benefit though she feels it does not help sometimes she takes it..   She is also on lamotrigine , cyclobenzaprine  (takes prn), clonazepam  0.5 once a day and occassionally at night.  Baclofen  caused sleepiness.   Gabapentin, Lyrica and Tegretol were not well tolerated.  Cyclobenzaprine  The PDMP was reviewed and she is not getting med's from other sources and does not have drug seeking behavior.      She weights less than last time (85 pounds now).    She was 83 last visit with me.     She is off Megace  and feels her appetite improved on its own.   She feels she is eating well.       MS History:  She had right optic neuritis in December 2012 and had an MRI of the brain showing optic nerve inflammation and many white matter spots.    She saw Dr. Herchel in [place].   She was started on Copaxone.  She likely had an exacerbation in 2013 when she had trouble walking x 2 months.   She received a few days of IV Solu-Medrol .  Last year, she also received a few days of IV Solu-Medrol  but she is uncertain   She felt she did well for the first 2 years but switched to Gilenya because she was tired of the shots last year.   At first, she tolerated it well but then her depression got much worse and she had some stomach issues so she returned to Copaxone last month (20 mg daily).   MRI early 2016 showed multiple old MS plaques and 1  Enhancing focus in the right frontal lobe prompting change to Tysabi.   She has been on Tysabri  since 2016 and has been JCV Ab negative.   MRI Brain/spine 02/2017:  MRI of the brain and MRI of the thoracic spine performed during her recent hospitalization. The MRI of the brain shows many T2/FLAIR hyperintense foci in the periventricular, juxtacortical and deep white matter. Her to be any change when compared to her previous MRI. The MRI of the thoracic spine shows multiple T2 hyperintense lesions. The T6-T7 lesion is fairly large and there could be slight expansion of the spinal cord. However, there is no enhancement.       MRI brain 04/02/2020 was unchanged.     MR brain 03/28/2023 was unchanged      REVIEW OF SYSTEMS: Out of a complete 14 system review of symptoms, the patient complains only of the following symptoms, chronic pain, decreased appetite, discoloration of posterior legs, blurred vision and all other reviewed systems are negative.    ALLERGIES: Allergies  Allergen Reactions   Amoxicillin Anaphylaxis   Penicillins  Anaphylaxis   Ciprofloxacin    Methocarbamol      Allergic to cipro    Sulfa Antibiotics Other (See Comments)    unknown   Codeine Nausea And Vomiting     HOME MEDICATIONS: Outpatient Medications Prior to Visit  Medication Sig Dispense Refill   Armodafinil  200 MG TABS Take 1 tablet by mouth once daily 30 tablet 2   clonazePAM  (  KLONOPIN ) 0.5 MG tablet Take 1 tablet (0.5 mg total) by mouth 2 (two) times daily. 60 tablet 2   DULoxetine  (CYMBALTA ) 60 MG capsule Take 1 capsule (60 mg total) by mouth daily. 90 capsule 2   feeding supplement, ENSURE ENLIVE, (ENSURE ENLIVE) LIQD Take 237 mLs by mouth 2 (two) times daily between meals. 237 mL 12   megestrol  (MEGACE ) 40 MG tablet Take 1 tablet (40 mg total) by mouth 2 (two) times daily. 60 tablet 7   naloxone  (NARCAN ) 0.4 MG/ML injection Use as directed 1 mL 1   natalizumab  (TYSABRI ) 300 MG/15ML injection Inject 15 mLs (300 mg total) into the vein every 30 (thirty) days. 15 mL 11   oxyCODONE -acetaminophen  (PERCOCET/ROXICET) 5-325 MG tablet Take 1 tablet by mouth 4 (four) times daily as needed for severe pain (pain score 7-10). One po qid prn 120 tablet 0   ranitidine (ZANTAC) 150 MG tablet Take 150 mg by mouth 2 (two) times daily.     VITAMIN D  PO Take 1,000 Units by mouth daily.     No facility-administered medications prior to visit.     PAST MEDICAL HISTORY: Past Medical History:  Diagnosis Date   Chronic pain syndrome    Depression    Fibromyalgia    GAD (generalized anxiety disorder)    Headache    Hypersomnolence    Multiple sclerosis 10/28/2014   Obsessive compulsive disorder    Vision abnormalities      PAST SURGICAL HISTORY: Past Surgical History:  Procedure Laterality Date   NO PAST SURGERIES       FAMILY HISTORY: Family History  Problem Relation Age of Onset   Healthy Mother    Diabetes type II Father    Prostate cancer Father      SOCIAL HISTORY: Social History   Socioeconomic History   Marital status:  Married    Spouse name: Not on file   Number of children: Not on file   Years of education: Not on file   Highest education level: Not on file  Occupational History   Not on file  Tobacco Use   Smoking status: Every Day    Current packs/day: 0.50    Types: Cigarettes   Smokeless tobacco: Never  Vaping Use   Vaping status: Never Used  Substance and Sexual Activity   Alcohol use: No    Alcohol/week: 0.0 standard drinks of alcohol   Drug use: No   Sexual activity: Not on file  Other Topics Concern   Not on file  Social History Narrative   Not on file   Social Drivers of Health   Financial Resource Strain: Not on file  Food Insecurity: Not on file  Transportation Needs: Not on file  Physical Activity: Not on file  Stress: Not on file  Social Connections: Not on file  Intimate Partner Violence: Not on file      PHYSICAL EXAM  There were no vitals filed for this visit.    There is no height or weight on file to calculate BMI.   Generalized: thin, in no acute distress  Cardiology: normal rate and rhythm, no murmur auscultated  Respiratory: clear to auscultation bilaterally    Neurological examination  Mentation: Alert oriented to time, place, history taking. Follows all commands speech and language fluent Cranial nerve II-XII: Pupils were equal round reactive to light. Extraocular movements were full, visual field were full on confrontational test. Facial sensation and strength were normal. Head turning and shoulder shrug  were normal  and symmetric. Motor: The motor testing reveals 5 over 5 strength of all 4 extremities with exception of 4/5 left hip flexion  Sensory: Sensory testing is intact to soft touch on all 4 extremities. No evidence of extinction is noted.  Coordination: Cerebellar testing reveals good finger-nose-finger and heel-to-shin bilaterally.  Gait and station: Kyphosis noted, unable to tolerate sitting upright due to back pain, Gait is wide but  stable without assistive device. Tandem gait is mildly wide and unsteady  Reflexes: Deep tendon reflexes brisk bilaterally     DIAGNOSTIC DATA (LABS, IMAGING, TESTING) - I reviewed patient records, labs, notes, testing and imaging myself where available.  Lab Results  Component Value Date   WBC 6.4 09/12/2023   HGB 15.2 09/12/2023   HCT 43.8 09/12/2023   MCV 98 (H) 09/12/2023   PLT 251 09/12/2023      Component Value Date/Time   NA 139 06/21/2022 1412   K 4.3 06/21/2022 1412   CL 101 06/21/2022 1412   CO2 26 06/21/2022 1412   GLUCOSE 88 06/21/2022 1412   GLUCOSE 138 (H) 02/23/2017 0452   BUN 7 06/21/2022 1412   CREATININE 0.67 06/21/2022 1412   CALCIUM 9.2 06/21/2022 1412   PROT 6.7 06/21/2022 1412   ALBUMIN 4.6 06/21/2022 1412   AST 13 06/21/2022 1412   ALT 8 06/21/2022 1412   ALKPHOS 96 06/21/2022 1412   BILITOT 0.3 06/21/2022 1412   GFRNONAA 110 08/20/2020 1345   GFRAA 126 08/20/2020 1345   No results found for: CHOL, HDL, LDLCALC, LDLDIRECT, TRIG, CHOLHDL No results found for: YHAJ8R No results found for: VITAMINB12 Lab Results  Component Value Date   TSH 1.239 02/21/2017        No data to display               No data to display           ASSESSMENT AND PLAN  43 y.o. year old female  has a past medical history of Chronic pain syndrome, Depression, Fibromyalgia, GAD (generalized anxiety disorder), Headache, Hypersomnolence, Multiple sclerosis (10/28/2014), Obsessive compulsive disorder, and Vision abnormalities. here with   No diagnosis found.  Sabrina [name] feels that MS symptoms are fairly stable but continues to have significant low back pain with radicular symptoms. She will continue Tysabri  infusions every 4 weeks. We will update labs today. MRI stable in 03/2020. She will call to schedule imaging. Continue oxycodone  QID. PDMP reviewed and shows appropriate refills. Last filled 12/13/2022. She will continue clonazepam , duloxetine , and  modafinil  as prescribed. She was encouraged to continue healthy lifestyle habits. Fall precautions advised. She will follow up with Dr Vear in 4-6 months.   No orders of the defined types were placed in this encounter.    No orders of the defined types were placed in this encounter.   Greig Forbes, MSN, FNP-C 04/02/2024, 9:24 AM  [facility] [address] [place], [place] [address] [phone]

## 2024-04-02 NOTE — Patient Instructions (Signed)

## 2024-04-03 ENCOUNTER — Encounter: Payer: Self-pay | Admitting: Family Medicine

## 2024-04-03 ENCOUNTER — Ambulatory Visit (INDEPENDENT_AMBULATORY_CARE_PROVIDER_SITE_OTHER): Admitting: Family Medicine

## 2024-04-03 VITALS — BP 117/84 | HR 93 | Ht 64.0 in | Wt 89.0 lb

## 2024-04-03 DIAGNOSIS — F09 Unspecified mental disorder due to known physiological condition: Secondary | ICD-10-CM

## 2024-04-03 DIAGNOSIS — M62838 Other muscle spasm: Secondary | ICD-10-CM

## 2024-04-03 DIAGNOSIS — R5383 Other fatigue: Secondary | ICD-10-CM | POA: Diagnosis not present

## 2024-04-03 DIAGNOSIS — Z79891 Long term (current) use of opiate analgesic: Secondary | ICD-10-CM

## 2024-04-03 DIAGNOSIS — R208 Other disturbances of skin sensation: Secondary | ICD-10-CM | POA: Diagnosis not present

## 2024-04-03 DIAGNOSIS — R269 Unspecified abnormalities of gait and mobility: Secondary | ICD-10-CM | POA: Diagnosis not present

## 2024-04-03 DIAGNOSIS — Z79899 Other long term (current) drug therapy: Secondary | ICD-10-CM | POA: Diagnosis not present

## 2024-04-03 DIAGNOSIS — R636 Underweight: Secondary | ICD-10-CM

## 2024-04-03 DIAGNOSIS — G8929 Other chronic pain: Secondary | ICD-10-CM | POA: Diagnosis not present

## 2024-04-03 DIAGNOSIS — G35 Multiple sclerosis: Secondary | ICD-10-CM

## 2024-04-03 DIAGNOSIS — F32A Depression, unspecified: Secondary | ICD-10-CM | POA: Diagnosis not present

## 2024-04-03 MED ORDER — DULOXETINE HCL 60 MG PO CPEP: 60.0000 mg | ORAL_CAPSULE | Freq: Every day | ORAL | 1 refills | Status: AC

## 2024-04-03 MED ORDER — ARMODAFINIL 200 MG PO TABS: 1.0000 | ORAL_TABLET | Freq: Every day | ORAL | 5 refills | Status: AC

## 2024-04-04 ENCOUNTER — Other Ambulatory Visit: Payer: Self-pay | Admitting: Neurology

## 2024-04-04 MED ORDER — OXYCODONE-ACETAMINOPHEN 5-325 MG PO TABS: 1.0000 | ORAL_TABLET | Freq: Four times a day (QID) | ORAL | 0 refills | Status: DC | PRN

## 2024-04-04 NOTE — Telephone Encounter (Signed)
 Dr.Penumalli you are work in provider this pm  Last seen on 04/03/24 No 6 month follow up scheduled    Dispensed Days Supply Quantity Provider Pharmacy  OXYCOD/APAP  TAB 5-325MG  03/07/2024 30 120 tablet Sater, Charlie LABOR, MD [facility] [phone] ...     Rx pending to be signed

## 2024-04-04 NOTE — Telephone Encounter (Signed)
 Pt husband called to  request  medication refill  \oxyCODONE -acetaminophen  (PERCOCET/ROXICET) 5-325 MG tablet    Pt would like medication sent to :  [facility] - [place], [place] - [address] (Ph: [phone])

## 2024-04-15 DIAGNOSIS — G35D Multiple sclerosis, unspecified: Secondary | ICD-10-CM | POA: Diagnosis not present

## 2024-04-23 ENCOUNTER — Other Ambulatory Visit: Payer: Self-pay | Admitting: Neurology

## 2024-04-23 ENCOUNTER — Telehealth: Payer: Self-pay | Admitting: Family Medicine

## 2024-04-23 NOTE — Telephone Encounter (Signed)
 Phone room: please call back and schedule follow up first around 10/01/24 and let pt know it is too soon to refill. She should call back week of 04/29/2024  Last seen 04/03/24 and next f/u not scheduled. Last refilled 04/06/24 #120. Too soon to send in refill

## 2024-04-23 NOTE — Telephone Encounter (Signed)
 Phone rep relayed messages from RN, pt will speak with billing dept about bad debt balance and then schedule her needed f/u, also pt will reach out to pharmacy on the week of the  20th for her refill.

## 2024-04-23 NOTE — Telephone Encounter (Signed)
 Pt is requesting a refill for oxyCODONE-acetaminophen (PERCOCET/ROXICET) 5-325 MG tablet.  Pharmacy: [id number]

## 2024-04-23 NOTE — Telephone Encounter (Signed)
 Last seen on 04/03/24 No 6 month follow up scheduled    Dispensed Days Supply Quantity Provider Pharmacy  CLONAZEPAM  0.5MG     TAB 03/27/2024 30 60 each Sater, Charlie LABOR, MD [facility] [phone] ...     Rx pending to be signed

## 2024-05-02 ENCOUNTER — Other Ambulatory Visit: Payer: Self-pay | Admitting: Neurology

## 2024-05-02 MED ORDER — OXYCODONE-ACETAMINOPHEN 5-325 MG PO TABS: 1.0000 | ORAL_TABLET | Freq: Four times a day (QID) | ORAL | 0 refills | Status: DC | PRN

## 2024-05-02 NOTE — Telephone Encounter (Signed)
 Pt's husband called to request a refill on his wifes oxyCODONE -acetaminophen  (PERCOCET/ROXICET) 5-325 MG tablet today due to it being a short day on Friday and he is needing it sent to the [facility] in [place]

## 2024-05-02 NOTE — Telephone Encounter (Signed)
 Last seen on 04/03/24  Follow up scheduled 11/26/24   Dispensed Days Supply Quantity Provider Pharmacy  OXYCOD/ACETAMIN 5-325MG  TAB 04/06/2024 30 120 each Penumalli, Eduard SAUNDERS, MD [facility] [phone] ...     Rx pending to be signed

## 2024-05-27 ENCOUNTER — Other Ambulatory Visit: Payer: Self-pay | Admitting: Neurology

## 2024-05-28 NOTE — Telephone Encounter (Signed)
 Last appt 04/03/24 Next appt 11/26/24 Dispenses   Dispensed Days Supply Quantity Provider Pharmacy  CLONAZEPAM  0.5MG     TAB 04/26/2024 30 60 each Sater, Charlie LABOR, MD [facility] [phone] ...  CLONAZEPAM  0.5MG     TAB 03/27/2024 30 60 each Sater, Charlie LABOR, MD [facility] [phone] ...  CLONAZEPAM  0.5MG     TAB 02/26/2024 30 60 each Sater, Charlie LABOR, MD [facility] [phone] ...  CLONAZEPAM    TAB 0.5MG  01/25/2024 30 60 tablet Sater, Charlie LABOR, MD [facility] [phone] ...  CLONAZEPAM  0.5MG     TAB 12/25/2023 20 60 each Sater, Charlie LABOR, MD [facility] [phone] ...  CLONAZEPAM    TAB 0.5MG  11/22/2023 20 60 tablet Sater, Charlie LABOR, MD [facility] [phone] ...  CLONAZEPAM  0.5MG     TAB 10/23/2023 20 60 each Sater, Charlie LABOR, MD [facility] [phone] ...  CLONAZEPAM  0.5MG     TAB 09/23/2023 20 60 each Sater, Charlie LABOR, MD [facility] [phone] ...  CLONAZEPAM  0.5MG     TAB 08/24/2023 20 60 each Sater, Charlie LABOR, MD [facility] [phone] ...  CLONAZEPAM  0.5MG     TAB 07/25/2023 20 60 each Sater, Charlie LABOR, MD [facility] [phone] ...  CLONAZEPAM  0.5MG     TAB 06/25/2023 20 60 each Lomax, Amy, NP [facility] [phone] .SABRASABRA

## 2024-06-03 ENCOUNTER — Other Ambulatory Visit: Payer: Self-pay | Admitting: Neurology

## 2024-06-03 DIAGNOSIS — G35D Multiple sclerosis, unspecified: Secondary | ICD-10-CM | POA: Diagnosis not present

## 2024-06-03 MED ORDER — OXYCODONE-ACETAMINOPHEN 5-325 MG PO TABS: 1.0000 | ORAL_TABLET | Freq: Four times a day (QID) | ORAL | 0 refills | Status: DC | PRN

## 2024-06-03 NOTE — Telephone Encounter (Signed)
 Pt husband called to request medication refill oxyCODONE -acetaminophen  (PERCOCET/ROXICET) 5-325 MG tablet   Pt medication is to be sent to    [facility] - [place], [place] - [address] (Ph: [phone])

## 2024-06-03 NOTE — Telephone Encounter (Signed)
 Requested Prescriptions   Pending Prescriptions Disp Refills   oxyCODONE -acetaminophen  (PERCOCET/ROXICET) 5-325 MG tablet 120 tablet 0    Sig: Take 1 tablet by mouth 4 (four) times daily as needed for severe pain (pain score 7-10). One po qid prn   Last seen 04/03/24 Next appt 11/26/24 Dispenses   Dispensed Days Supply Quantity Provider Pharmacy  OXYCOD/ACETAMIN 5-325MG  TAB 05/06/2024 30 120 each Sater, Charlie LABOR, MD [facility] [phone] ...  OXYCOD/ACETAMIN 5-325MG  TAB 04/06/2024 30 120 each Penumalli, Eduard SAUNDERS, MD [facility] [phone] ...  OXYCOD/APAP  TAB 5-325MG  03/07/2024 30 120 tablet Sater, Charlie LABOR, MD [facility] [phone] ...  OXYCOD/ACETAMIN 5-325MG  TAB 02/06/2024 30 120 each Sater, Charlie LABOR, MD [facility] [phone] ...  OXYCOD/ACETAMIN 5-325MG  TAB 01/07/2024 30 120 each Sater, Charlie LABOR, MD [facility] [phone] ...  OXYCOD/ACETAMIN 5-325MG  TAB 12/08/2023 30 120 each Sater, Charlie LABOR, MD [facility] [phone] ...  OXYCOD/ACETAMIN 5-325MG  TAB 11/08/2023 30 120 each Sater, Charlie LABOR, MD [facility] [phone] ...  OXYCOD/ACETAMIN 5-325MG  TAB 10/09/2023 20 120 each Sater, Charlie LABOR, MD [facility] [phone] ...  OXYCOD/ACETAMIN 5-325MG  TAB 09/09/2023 30 120 each Lomax, Amy, NP [facility] [phone] ...  OXYCOD/ACETAMIN 5-325MG  TAB 08/10/2023 30 120 each Sater, Charlie LABOR, MD [facility] [phone] ...  OXYCOD/ACETAMIN 5-325MG  TAB 07/11/2023 30 120 each Sater, Charlie LABOR, MD [facility] [phone] ...  OXYCOD/ACETAMIN 5-325MG  TAB 06/11/2023 30 120 each Ines Onetha NOVAK, MD [facility] [phone] .SABRASABRA

## 2024-06-25 ENCOUNTER — Other Ambulatory Visit: Payer: Self-pay | Admitting: Neurology

## 2024-06-26 NOTE — Telephone Encounter (Signed)
 Requested Prescriptions   Pending Prescriptions Disp Refills   clonazePAM  (KLONOPIN ) 0.5 MG tablet [Pharmacy Med Name: clonazePAM  0.5 MG Oral Tablet] 60 tablet 0    Sig: Take 1 tablet by mouth twice daily   Last seen 04/03/24 Next appt 11/26/24 Dispenses   Dispensed Days Supply Quantity Provider Pharmacy  CLONAZEPAM  0.5MG     TAB 05/28/2024 30 60 each Sater, Charlie LABOR, MD [facility] [phone] ...  CLONAZEPAM  0.5MG     TAB 04/26/2024 30 60 each Sater, Charlie LABOR, MD [facility] [phone] ...  CLONAZEPAM  0.5MG     TAB 03/27/2024 30 60 each Sater, Charlie LABOR, MD [facility] [phone] ...  CLONAZEPAM  0.5MG     TAB 02/26/2024 30 60 each Sater, Charlie LABOR, MD [facility] [phone] ...  CLONAZEPAM    TAB 0.5MG  01/25/2024 30 60 tablet Sater, Charlie LABOR, MD [facility] [phone] ...  CLONAZEPAM  0.5MG     TAB 12/25/2023 20 60 each Sater, Charlie LABOR, MD [facility] [phone] ...  CLONAZEPAM    TAB 0.5MG  11/22/2023 20 60 tablet Sater, Charlie LABOR, MD [facility] [phone] ...  CLONAZEPAM  0.5MG     TAB 10/23/2023 20 60 each Sater, Charlie LABOR, MD [facility] [phone] ...  CLONAZEPAM  0.5MG     TAB 09/23/2023 20 60 each Sater, Charlie LABOR, MD [facility] [phone] ...  CLONAZEPAM  0.5MG     TAB 08/24/2023 20 60 each Sater, Charlie LABOR, MD [facility] [phone] ...  CLONAZEPAM  0.5MG     TAB 07/25/2023 20 60 each Sater, Charlie LABOR, MD [facility] [phone] .SABRASABRA

## 2024-07-02 ENCOUNTER — Other Ambulatory Visit: Payer: Self-pay | Admitting: Neurology

## 2024-07-02 MED ORDER — OXYCODONE-ACETAMINOPHEN 5-325 MG PO TABS: 1.0000 | ORAL_TABLET | Freq: Four times a day (QID) | ORAL | 0 refills | Status: DC | PRN

## 2024-07-02 NOTE — Telephone Encounter (Signed)
 Requested Prescriptions   Pending Prescriptions Disp Refills   oxyCODONE -acetaminophen  (PERCOCET/ROXICET) 5-325 MG tablet 120 tablet 0    Sig: Take 1 tablet by mouth 4 (four) times daily as needed for severe pain (pain score 7-10). One po qid prn   Last 04/03/24 Next appt 11/26/24 Dispenses   Dispensed Days Supply Quantity Provider Pharmacy  OXYCOD/ACETAMIN 5-325MG  TAB 06/05/2024 30 120 each Sater, Charlie LABOR, MD [facility] [phone] ...  OXYCOD/ACETAMIN 5-325MG  TAB 05/06/2024 30 120 each Sater, Charlie LABOR, MD [facility] [phone] ...  OXYCOD/ACETAMIN 5-325MG  TAB 04/06/2024 30 120 each Penumalli, Eduard SAUNDERS, MD [facility] [phone] ...  OXYCOD/APAP  TAB 5-325MG  03/07/2024 30 120 tablet Sater, Charlie LABOR, MD [facility] [phone] ...  OXYCOD/ACETAMIN 5-325MG  TAB 02/06/2024 30 120 each Sater, Charlie LABOR, MD [facility] [phone] ...  OXYCOD/ACETAMIN 5-325MG  TAB 01/07/2024 30 120 each Sater, Charlie LABOR, MD [facility] [phone] ...  OXYCOD/ACETAMIN 5-325MG  TAB 12/08/2023 30 120 each Sater, Charlie LABOR, MD [facility] [phone] ...  OXYCOD/ACETAMIN 5-325MG  TAB 11/08/2023 30 120 each Sater, Charlie LABOR, MD [facility] [phone] ...  OXYCOD/ACETAMIN 5-325MG  TAB 10/09/2023 20 120 each Sater, Charlie LABOR, MD [facility] [phone] ...  OXYCOD/ACETAMIN 5-325MG  TAB 09/09/2023 30 120 each Lomax, Amy, NP [facility] [phone] ...  OXYCOD/ACETAMIN 5-325MG  TAB 08/10/2023 30 120 each Sater, Charlie LABOR, MD [facility] [phone] ...  OXYCOD/ACETAMIN 5-325MG  TAB 07/11/2023 30 120 each Sater, Charlie LABOR, MD [facility] [phone] .SABRASABRA

## 2024-07-02 NOTE — Telephone Encounter (Signed)
 Pt is needing her oxyCODONE -acetaminophen  (PERCOCET/ROXICET) 5-325 MG tablet called in. She states it is due the 26th of dec so she wants to make sure it will be ready for her at the [facility] on [address] . Please advise.

## 2024-08-01 ENCOUNTER — Other Ambulatory Visit: Payer: Self-pay | Admitting: Neurology

## 2024-08-01 MED ORDER — OXYCODONE-ACETAMINOPHEN 5-325 MG PO TABS: 1.0000 | ORAL_TABLET | Freq: Four times a day (QID) | ORAL | 0 refills | Status: DC | PRN

## 2024-08-01 NOTE — Telephone Encounter (Signed)
 Requested Prescriptions   Pending Prescriptions Disp Refills   oxyCODONE -acetaminophen  (PERCOCET/ROXICET) 5-325 MG tablet 120 tablet 0    Sig: Take 1 tablet by mouth 4 (four) times daily as needed for severe pain (pain score 7-10). One po qid prn   Last seen 04/03/24 Next appt 11/26/24  Dispenses   Dispensed Days Supply Quantity Provider Pharmacy  OXYCOD/ACETAMIN 5-325MG  TAB 07/05/2024 30 120 each Lomax, Amy, NP [facility] [phone] ...  OXYCOD/ACETAMIN 5-325MG  TAB 06/05/2024 30 120 each Sater, Charlie LABOR, MD [facility] [phone] ...  OXYCOD/ACETAMIN 5-325MG  TAB 05/06/2024 30 120 each Sater, Charlie LABOR, MD [facility] [phone] ...  OXYCOD/ACETAMIN 5-325MG  TAB 04/06/2024 30 120 each Penumalli, Eduard SAUNDERS, MD [facility] [phone] ...  oxyCODONE -acetaminophen   tablet 5-325 mg 03/07/2024 30 120 Sater, Charlie LABOR, MD [facility] [phone] ...  OXYCOD/ACETAMIN 5-325MG  TAB 02/06/2024 30 120 each Sater, Charlie LABOR, MD [facility] [phone] ...  OXYCOD/ACETAMIN 5-325MG  TAB 01/07/2024 30 120 each Sater, Charlie LABOR, MD [facility] [phone] ...  OXYCOD/ACETAMIN 5-325MG  TAB 12/08/2023 30 120 each Sater, Charlie LABOR, MD [facility] [phone] ...  OXYCOD/ACETAMIN 5-325MG  TAB 11/08/2023 30 120 each Sater, Charlie LABOR, MD [facility] [phone] ...  OXYCOD/ACETAMIN 5-325MG  TAB 10/09/2023 20 120 each Sater, Charlie LABOR, MD [facility] [phone] ...  OXYCOD/ACETAMIN 5-325MG  TAB 09/09/2023 30 120 each Lomax, Amy, NP [facility] [phone] ...  OXYCOD/ACETAMIN 5-325MG  TAB 08/10/2023 30 120 each Sater, Charlie LABOR, MD [facility] [phone] .SABRASABRA

## 2024-08-01 NOTE — Telephone Encounter (Signed)
 Pt is needing a refill on her oxyCODONE -acetaminophen  (PERCOCET/ROXICET) 5-325 MG tablet sent in to the [facility] on [address].

## 2024-08-03 ENCOUNTER — Telehealth: Payer: Self-pay | Admitting: Neurology

## 2024-08-03 ENCOUNTER — Other Ambulatory Visit: Payer: Self-pay | Admitting: Neurology

## 2024-08-03 MED ORDER — OXYCODONE-ACETAMINOPHEN 5-325 MG PO TABS: 1.0000 | ORAL_TABLET | Freq: Four times a day (QID) | ORAL | 0 refills | Status: AC | PRN

## 2024-08-03 MED ORDER — NALOXONE HCL 0.4 MG/ML IJ SOLN: INTRAMUSCULAR | 1 refills | Status: DC

## 2024-08-03 NOTE — Progress Notes (Unsigned)
 No show

## 2024-08-03 NOTE — Telephone Encounter (Signed)
 Hi Sabrina [name] ,   The patient's mother called from [place], [place], wanted to fill medications 2 days earlier due to inclement weather warning.  I explained that Narcotic medication cannot be refilled after hours- she stated she will call Monday again, she is able to have enough medication for the today and tomorrow, was just concerned that a refill on Monday may not be possible.  After I learnt about the transportation issues in general , and this 22 plus year- old -mother caring for 2 adult disabled children , I relented and wrote pain medication QID for 7 days, no refills.  Just to allow her to have enough if case they cannot leave the house after today .   This may upset the regular rhythm of the prescription: Sabrina [name], please review.    PS :  There is also a concern about a new pre-authorization needed to refill another medication ( not sure if she referred toTysabri (?) otherwise a 300 dollar co-pay ensues.   Sabrina [name].

## 2024-08-05 ENCOUNTER — Other Ambulatory Visit: Payer: Self-pay | Admitting: Neurology

## 2024-08-05 MED ORDER — NALOXONE HCL 4 MG/0.1ML NA LIQD: NASAL | 1 refills | Status: AC

## 2024-11-26 ENCOUNTER — Ambulatory Visit: Admitting: Neurology
